# Patient Record
Sex: Female | Born: 1935 | Race: White | Hispanic: No | Smoking: Former smoker
Health system: Southern US, Community
[De-identification: ages and names within clinical notes are randomized; demographics above are authoritative.]

## PROBLEM LIST (undated history)

## (undated) DIAGNOSIS — R001 Bradycardia, unspecified: Secondary | ICD-10-CM

## (undated) DIAGNOSIS — J439 Emphysema, unspecified: Secondary | ICD-10-CM

## (undated) DIAGNOSIS — I252 Old myocardial infarction: Secondary | ICD-10-CM

## (undated) DIAGNOSIS — I509 Heart failure, unspecified: Secondary | ICD-10-CM

## (undated) DIAGNOSIS — I10 Essential (primary) hypertension: Secondary | ICD-10-CM

## (undated) HISTORY — DX: Essential (primary) hypertension: I10

## (undated) HISTORY — DX: Old myocardial infarction: I25.2

## (undated) HISTORY — PX: HIP ARTHROPLASTY: SHX981

## (undated) HISTORY — DX: Heart failure, unspecified: I50.9

## (undated) HISTORY — DX: Emphysema, unspecified: J43.9

## (undated) HISTORY — DX: Bradycardia, unspecified: R00.1

---

## 2020-07-30 ENCOUNTER — Emergency Department (HOSPITAL_COMMUNITY)
Admission: EM | Admit: 2020-07-30 | Discharge: 2020-07-30 | Disposition: A | Discharge status: Home / Self Care | Payer: Medicare Other | Attending: Emergency Medicine | Admitting: Emergency Medicine

## 2020-07-30 ENCOUNTER — Emergency Department (HOSPITAL_COMMUNITY): Payer: Medicare Other

## 2020-07-30 ENCOUNTER — Encounter (HOSPITAL_COMMUNITY): Payer: Self-pay | Admitting: Emergency Medicine

## 2020-07-30 ENCOUNTER — Other Ambulatory Visit: Payer: Self-pay

## 2020-07-30 DIAGNOSIS — N289 Disorder of kidney and ureter, unspecified: Secondary | ICD-10-CM | POA: Diagnosis not present

## 2020-07-30 DIAGNOSIS — W19XXXA Unspecified fall, initial encounter: Secondary | ICD-10-CM | POA: Diagnosis not present

## 2020-07-30 DIAGNOSIS — N184 Chronic kidney disease, stage 4 (severe): Secondary | ICD-10-CM | POA: Insufficient documentation

## 2020-07-30 DIAGNOSIS — R519 Headache, unspecified: Secondary | ICD-10-CM | POA: Insufficient documentation

## 2020-07-30 DIAGNOSIS — F0390 Unspecified dementia without behavioral disturbance: Secondary | ICD-10-CM | POA: Diagnosis not present

## 2020-07-30 LAB — CBC WITH DIFFERENTIAL/PLATELET
Abs Immature Granulocytes: 0.08 10*3/uL — ABNORMAL HIGH (ref 0.00–0.07)
Basophils Absolute: 0 10*3/uL (ref 0.0–0.1)
Basophils Relative: 0 %
Eosinophils Absolute: 0.2 10*3/uL (ref 0.0–0.5)
Eosinophils Relative: 1 %
HCT: 37.8 % (ref 36.0–46.0)
Hemoglobin: 12 g/dL (ref 12.0–15.0)
Immature Granulocytes: 1 %
Lymphocytes Relative: 8 %
Lymphs Abs: 1.2 10*3/uL (ref 0.7–4.0)
MCH: 30.8 pg (ref 26.0–34.0)
MCHC: 31.7 g/dL (ref 30.0–36.0)
MCV: 96.9 fL (ref 80.0–100.0)
Monocytes Absolute: 1.5 10*3/uL — ABNORMAL HIGH (ref 0.1–1.0)
Monocytes Relative: 10 %
Neutro Abs: 11.8 10*3/uL — ABNORMAL HIGH (ref 1.7–7.7)
Neutrophils Relative %: 80 %
Platelets: 249 10*3/uL (ref 150–400)
RBC: 3.9 MIL/uL (ref 3.87–5.11)
RDW: 15.5 % (ref 11.5–15.5)
WBC: 14.8 10*3/uL — ABNORMAL HIGH (ref 4.0–10.5)
nRBC: 0 % (ref 0.0–0.2)

## 2020-07-30 LAB — BASIC METABOLIC PANEL
Anion gap: 11 (ref 5–15)
BUN: 44 mg/dL — ABNORMAL HIGH (ref 8–23)
CO2: 26 mmol/L (ref 22–32)
Calcium: 9.4 mg/dL (ref 8.9–10.3)
Chloride: 103 mmol/L (ref 98–111)
Creatinine, Ser: 2.43 mg/dL — ABNORMAL HIGH (ref 0.44–1.00)
GFR, Estimated: 18 mL/min — ABNORMAL LOW (ref >60)
Glucose, Bld: 121 mg/dL — ABNORMAL HIGH (ref 70–99)
Potassium: 3.6 mmol/L (ref 3.5–5.1)
Sodium: 140 mmol/L (ref 135–145)

## 2020-07-30 MED ORDER — LORAZEPAM 1 MG PO TABS
1.0000 mg | ORAL_TABLET | Freq: Once | ORAL | Status: AC
  Administered 2020-07-30: 1 mg via ORAL
  Filled 2020-07-30: qty 1

## 2020-07-30 MED ORDER — SODIUM CHLORIDE 0.9 % IV BOLUS
1000.0000 mL | Freq: Once | INTRAVENOUS | Status: AC
  Administered 2020-07-30: 1000 mL via INTRAVENOUS

## 2020-07-30 NOTE — ED Notes (Signed)
This RN attempted x2 to call report to [facility], this RN kept being put on hold.

## 2020-07-30 NOTE — ED Provider Notes (Signed)
[place] DEPT Provider Note   CSN: [id number] Arrival date & time: 07/30/20  [id number]     History Chief Complaint  Patient presents with  . Fall    Tanya Koch is a 87 y.o. female.  Patient presents for concern for possible fall.  She has history of dementia was found on the ground by caregiver.  She was last seen the night prior to this morning.  Patient herself denies any pain.  She denies headache or chest pain abdominal pain or extremity pain.  There are no reports of fever or cough or vomiting or diarrhea.          History reviewed. No pertinent past medical history.  There are no problems to display for this patient.      OB History   No obstetric history on file.     History reviewed. No pertinent family history.     Home Medications Prior to Admission medications   Not on File    Allergies    Patient has no allergy information on record.  Review of Systems   Review of Systems  Unable to perform ROS: Dementia    Physical Exam Updated Vital Signs BP (!) 178/100 (BP Location: Left Arm)   Pulse 67   Temp 98.2 F (36.8 C) (Oral)   Resp 16   SpO2 96%   Physical Exam Constitutional:      General: She is not in acute distress.    Appearance: Normal appearance.  HENT:     Head: Normocephalic and atraumatic.     Comments: No scalp hematoma or tenderness or laceration noted.    Nose: Nose normal.  Eyes:     Extraocular Movements: Extraocular movements intact.  Cardiovascular:     Rate and Rhythm: Normal rate.  Pulmonary:     Effort: Pulmonary effort is normal.  Musculoskeletal:        General: Normal range of motion.     Cervical back: Normal range of motion.     Comments: No C or T or L-spine midline step-offs or tenderness noted.  Neurological:     General: No focal deficit present.     Mental Status: She is alert. Mental status is at baseline.     ED Results / Procedures / Treatments   Labs (all  labs ordered are listed, but only abnormal results are displayed) Labs Reviewed  CBC WITH DIFFERENTIAL/PLATELET - Abnormal; Notable for the following components:      Result Value   WBC 14.8 (*)    Neutro Abs 11.8 (*)    Monocytes Absolute 1.5 (*)    Abs Immature Granulocytes 0.08 (*)    All other components within normal limits  BASIC METABOLIC PANEL - Abnormal; Notable for the following components:   Glucose, Bld 121 (*)    BUN 44 (*)    Creatinine, Ser 2.43 (*)    GFR, Estimated 18 (*)    All other components within normal limits    EKG None  Radiology CT Head Wo Contrast  Result Date: 07/30/2020 CLINICAL DATA:  Head trauma, minor. Additional history provided: Unwitnessed fall. EXAM: CT HEAD WITHOUT CONTRAST TECHNIQUE: Contiguous axial images were obtained from the base of the skull through the vertex without intravenous contrast. COMPARISON:  No pertinent prior exams available for comparison. FINDINGS: Brain: Mild-to-moderate generalized cerebral atrophy. There is no acute intracranial hemorrhage. No demarcated cortical infarct. No extra-axial fluid collection. No evidence of intracranial mass. No midline shift. Vascular: No hyperdense  vessel.  Atherosclerotic calcifications Skull: Normal. Negative for fracture or focal lesion. Sinuses/Orbits: Visualized orbits show no acute finding. No significant paranasal sinus disease at the imaged levels. IMPRESSION: No evidence of acute intracranial abnormality. Mild-to-moderate generalized cerebral atrophy. Electronically Signed   By: Kellie Simmering DO   On: 07/30/2020 11:13    Procedures Procedures   Medications Ordered in ED Medications  sodium chloride 0.9 % bolus 1,000 mL (1,000 mLs Intravenous New Bag/Given 07/30/20 1201)    ED Course  I have reviewed the triage vital signs and the nursing notes.  Pertinent labs & imaging results that were available during my care of the patient were reviewed by me and considered in my medical decision  making (see chart for details).    MDM Rules/Calculators/A&P                          Patient appears comfortable with no complaints of pain.  She has no clinical evidence of head injury on exam and unable to give a complete history of what exactly happened.  Caregiver suspect that she may have some hip pain but patient herself denies at this time.  Labs are sent white count of 14 chemistry shows renal insufficiency creatinine 2.4 BUN of 44 potassium 3.6.  Unable to drop any prior labs unclear if this is acute or chronic.  Patient given a liter bolus of fluids, recommending outpatient follow-up with her doctor for kidney disease.  Advised immediate return for new numbness weakness fevers or any additional concerns.  Final Clinical Impression(s) / ED Diagnoses Final diagnoses:  Fall, initial encounter  Renal insufficiency    Rx / DC Orders ED Discharge Orders    None       Luna Fuse, MD 07/30/20 1250

## 2020-07-30 NOTE — Discharge Instructions (Addendum)
Blood work shows patient is having decreased renal function.  She will need to follow-up with her primary care doctor regarding this finding within the week.  Recommending immediate return for worsening symptoms fevers pain or any additional concerns.

## 2020-07-30 NOTE — ED Notes (Signed)
[facility] bedside 

## 2020-07-30 NOTE — ED Triage Notes (Signed)
BIBA Per EMS: Pt coming from [facility] with an unwitnessed fall. Pt was found on ground by caregivers. Pt has no obvious bruising or deformities. Pt denies pain or any other complaints. All vitals WDL.

## 2020-07-30 NOTE — ED Notes (Signed)
[facility] called  

## 2020-12-04 ENCOUNTER — Other Ambulatory Visit: Payer: Self-pay

## 2020-12-04 ENCOUNTER — Emergency Department (HOSPITAL_BASED_OUTPATIENT_CLINIC_OR_DEPARTMENT_OTHER)
Admission: EM | Admit: 2020-12-04 | Discharge: 2020-12-04 | Disposition: A | Discharge status: Home / Self Care | Payer: Medicare Other | Source: Home / Self Care | Attending: Emergency Medicine | Admitting: Emergency Medicine

## 2020-12-04 ENCOUNTER — Encounter (HOSPITAL_BASED_OUTPATIENT_CLINIC_OR_DEPARTMENT_OTHER): Payer: Self-pay | Admitting: Emergency Medicine

## 2020-12-04 DIAGNOSIS — R1111 Vomiting without nausea: Secondary | ICD-10-CM | POA: Insufficient documentation

## 2020-12-04 DIAGNOSIS — K920 Hematemesis: Secondary | ICD-10-CM | POA: Diagnosis not present

## 2020-12-04 DIAGNOSIS — K226 Gastro-esophageal laceration-hemorrhage syndrome: Secondary | ICD-10-CM | POA: Diagnosis not present

## 2020-12-04 LAB — CBC WITH DIFFERENTIAL/PLATELET
Abs Immature Granulocytes: 0.04 10*3/uL (ref 0.00–0.07)
Basophils Absolute: 0 10*3/uL (ref 0.0–0.1)
Basophils Relative: 0 %
Eosinophils Absolute: 0.4 10*3/uL (ref 0.0–0.5)
Eosinophils Relative: 3 %
HCT: 34.3 % — ABNORMAL LOW (ref 36.0–46.0)
Hemoglobin: 11.2 g/dL — ABNORMAL LOW (ref 12.0–15.0)
Immature Granulocytes: 0 %
Lymphocytes Relative: 12 %
Lymphs Abs: 1.4 10*3/uL (ref 0.7–4.0)
MCH: 30.8 pg (ref 26.0–34.0)
MCHC: 32.7 g/dL (ref 30.0–36.0)
MCV: 94.2 fL (ref 80.0–100.0)
Monocytes Absolute: 1.5 10*3/uL — ABNORMAL HIGH (ref 0.1–1.0)
Monocytes Relative: 12 %
Neutro Abs: 8.9 10*3/uL — ABNORMAL HIGH (ref 1.7–7.7)
Neutrophils Relative %: 73 %
Platelets: 242 10*3/uL (ref 150–400)
RBC: 3.64 MIL/uL — ABNORMAL LOW (ref 3.87–5.11)
RDW: 15.9 % — ABNORMAL HIGH (ref 11.5–15.5)
WBC: 12.3 10*3/uL — ABNORMAL HIGH (ref 4.0–10.5)
nRBC: 0 % (ref 0.0–0.2)

## 2020-12-04 LAB — COMPREHENSIVE METABOLIC PANEL
ALT: 6 U/L (ref 0–44)
AST: 14 U/L — ABNORMAL LOW (ref 15–41)
Albumin: 3.4 g/dL — ABNORMAL LOW (ref 3.5–5.0)
Alkaline Phosphatase: 45 U/L (ref 38–126)
Anion gap: 11 (ref 5–15)
BUN: 24 mg/dL — ABNORMAL HIGH (ref 8–23)
CO2: 30 mmol/L (ref 22–32)
Calcium: 9.1 mg/dL (ref 8.9–10.3)
Chloride: 98 mmol/L (ref 98–111)
Creatinine, Ser: 2.55 mg/dL — ABNORMAL HIGH (ref 0.44–1.00)
GFR, Estimated: 17 mL/min — ABNORMAL LOW (ref >60)
Glucose, Bld: 114 mg/dL — ABNORMAL HIGH (ref 70–99)
Potassium: 3.4 mmol/L — ABNORMAL LOW (ref 3.5–5.1)
Sodium: 139 mmol/L (ref 135–145)
Total Bilirubin: 0.4 mg/dL (ref 0.3–1.2)
Total Protein: 6.9 g/dL (ref 6.5–8.1)

## 2020-12-04 LAB — LIPASE, BLOOD: Lipase: 36 U/L (ref 11–51)

## 2020-12-04 MED ORDER — ONDANSETRON HCL 4 MG PO TABS: 4.0000 mg | ORAL_TABLET | Freq: Three times a day (TID) | ORAL | 0 refills | Status: DC | PRN

## 2020-12-04 MED ORDER — SODIUM CHLORIDE 0.9 % IV BOLUS
1000.0000 mL | Freq: Once | INTRAVENOUS | Status: AC
  Administered 2020-12-04: 1000 mL via INTRAVENOUS

## 2020-12-04 NOTE — ED Triage Notes (Signed)
Per ems , pt from  facility [facility] , emesis x 3 today , Hs dementia . No obvious distress.

## 2020-12-04 NOTE — ED Provider Notes (Signed)
[place] EMERGENCY DEPT Provider Note   CSN: [id number] Arrival date & time: 12/04/20  1839     History Chief Complaint  Patient presents with   Emesis    Tanya Koch is a 87 y.o. female.  Patient here from nursing home after she had 3 emesis episodes earlier today.  Currently she has no symptoms.  No abdominal pain, no chest pain, shortness of breath.  On antibiotics currently for dental infection.  She denies any diarrhea.  The history is provided by the patient.  Emesis Severity:  Mild Relieved by:  Nothing Worsened by:  Nothing Associated symptoms: no abdominal pain, no arthralgias, no chills, no cough, no diarrhea, no fever, no headaches, no myalgias, no sore throat and no URI       No past medical history on file.  There are no problems to display for this patient.   History reviewed. No pertinent surgical history.   OB History   No obstetric history on file.     No family history on file.     Home Medications Prior to Admission medications   Medication Sig Start Date End Date Taking? Authorizing Provider  ondansetron (ZOFRAN) 4 MG tablet Take 1 tablet (4 mg total) by mouth every 8 (eight) hours as needed for nausea or vomiting. 12/04/20  Yes Daylene Vandenbosch, DO    Allergies    Patient has no known allergies.  Review of Systems   Review of Systems  Constitutional:  Negative for chills and fever.  HENT:  Negative for ear pain and sore throat.   Eyes:  Negative for pain and visual disturbance.  Respiratory:  Negative for cough and shortness of breath.   Cardiovascular:  Negative for chest pain and palpitations.  Gastrointestinal:  Positive for vomiting. Negative for abdominal pain, diarrhea and nausea.  Genitourinary:  Negative for dysuria and hematuria.  Musculoskeletal:  Negative for arthralgias, back pain and myalgias.  Skin:  Negative for color change and rash.  Neurological:  Negative for seizures, syncope and headaches.  All  other systems reviewed and are negative.  Physical Exam Updated Vital Signs BP (!) 155/68 (BP Location: Right Arm)   Pulse (!) 52   Temp 98.4 F (36.9 C)   Resp 20   SpO2 100%   Physical Exam Vitals and nursing note reviewed.  Constitutional:      General: She is not in acute distress.    Appearance: She is well-developed. She is not ill-appearing.  HENT:     Head: Normocephalic and atraumatic.     Nose: Nose normal.     Mouth/Throat:     Mouth: Mucous membranes are moist.  Eyes:     Extraocular Movements: Extraocular movements intact.     Conjunctiva/sclera: Conjunctivae normal.     Pupils: Pupils are equal, round, and reactive to light.  Cardiovascular:     Rate and Rhythm: Normal rate and regular rhythm.     Pulses: Normal pulses.     Heart sounds: Normal heart sounds. No murmur heard. Pulmonary:     Effort: Pulmonary effort is normal. No respiratory distress.     Breath sounds: Normal breath sounds.  Abdominal:     General: Abdomen is flat.     Palpations: Abdomen is soft.     Tenderness: There is no abdominal tenderness.  Musculoskeletal:     Cervical back: Normal range of motion and neck supple.  Skin:    General: Skin is warm and dry.     Capillary  Refill: Capillary refill takes less than 2 seconds.  Neurological:     General: No focal deficit present.     Mental Status: She is alert.  Psychiatric:        Mood and Affect: Mood normal.    ED Results / Procedures / Treatments   Labs (all labs ordered are listed, but only abnormal results are displayed) Labs Reviewed  CBC WITH DIFFERENTIAL/PLATELET - Abnormal; Notable for the following components:      Result Value   WBC 12.3 (*)    RBC 3.64 (*)    Hemoglobin 11.2 (*)    HCT 34.3 (*)    RDW 15.9 (*)    Neutro Abs 8.9 (*)    Monocytes Absolute 1.5 (*)    All other components within normal limits  COMPREHENSIVE METABOLIC PANEL - Abnormal; Notable for the following components:   Potassium 3.4 (*)     Glucose, Bld 114 (*)    BUN 24 (*)    Creatinine, Ser 2.55 (*)    Albumin 3.4 (*)    AST 14 (*)    GFR, Estimated 17 (*)    All other components within normal limits  LIPASE, BLOOD    EKG None  Radiology No results found.  Procedures Procedures   Medications Ordered in ED Medications  sodium chloride 0.9 % bolus 1,000 mL (0 mLs Intravenous Stopped 12/04/20 2201)    ED Course  I have reviewed the triage vital signs and the nursing notes.  Pertinent labs & imaging results that were available during my care of the patient were reviewed by me and considered in my medical decision making (see chart for details).    MDM Rules/Calculators/A&P                           Tanya Koch is here from nursing home after she had some episodes of emesis today.  She is asymptomatic now.  Normal vitals.  No fever.  History of reflux.  Patient overall very well-appearing.  No abdominal tenderness.  Having normal bowel movements.  Currently on antibiotics for dental infection.  Teeth look well.  No signs of bad infection there.  May be some mild side effects from her antibiotics but lab work today was unremarkable.  No significant anemia, electrolyte abnormality, kidney injury.  Gallbladder liver enzymes within normal limits.  Lipase normal.  Overall reassurance is given.  She is not having any chest pain or shortness of breath.  Will prescribe Zofran to use as needed.  She is not currently nauseous.  Discharged in good condition.  This chart was dictated using voice recognition software.  Despite best efforts to proofread,  errors can occur which can change the documentation meaning.   Final Clinical Impression(s) / ED Diagnoses Final diagnoses:  Vomiting without nausea, unspecified vomiting type    Rx / DC Orders ED Discharge Orders          Ordered    ondansetron (ZOFRAN) 4 MG tablet  Every 8 hours PRN        12/04/20 2210             Lennice Sites, DO 12/04/20 2217

## 2020-12-05 ENCOUNTER — Emergency Department (HOSPITAL_COMMUNITY): Payer: Medicare Other

## 2020-12-05 ENCOUNTER — Encounter (HOSPITAL_COMMUNITY): Payer: Self-pay

## 2020-12-05 ENCOUNTER — Other Ambulatory Visit: Payer: Self-pay

## 2020-12-05 ENCOUNTER — Inpatient Hospital Stay (HOSPITAL_COMMUNITY)
Admission: EM | Admit: 2020-12-05 | Discharge: 2020-12-08 | DRG: 368 | Disposition: A | Discharge status: Nursing Home (Includes ICF) | Payer: Medicare Other | Source: Skilled Nursing Facility | Attending: Internal Medicine | Admitting: Internal Medicine

## 2020-12-05 DIAGNOSIS — U071 COVID-19: Secondary | ICD-10-CM | POA: Diagnosis present

## 2020-12-05 DIAGNOSIS — K2101 Gastro-esophageal reflux disease with esophagitis, with bleeding: Secondary | ICD-10-CM | POA: Diagnosis not present

## 2020-12-05 DIAGNOSIS — K920 Hematemesis: Secondary | ICD-10-CM

## 2020-12-05 DIAGNOSIS — F0390 Unspecified dementia without behavioral disturbance: Secondary | ICD-10-CM | POA: Diagnosis not present

## 2020-12-05 DIAGNOSIS — I130 Hypertensive heart and chronic kidney disease with heart failure and stage 1 through stage 4 chronic kidney disease, or unspecified chronic kidney disease: Secondary | ICD-10-CM | POA: Diagnosis present

## 2020-12-05 DIAGNOSIS — N184 Chronic kidney disease, stage 4 (severe): Secondary | ICD-10-CM | POA: Diagnosis present

## 2020-12-05 DIAGNOSIS — K226 Gastro-esophageal laceration-hemorrhage syndrome: Principal | ICD-10-CM | POA: Diagnosis present

## 2020-12-05 DIAGNOSIS — E876 Hypokalemia: Secondary | ICD-10-CM | POA: Diagnosis present

## 2020-12-05 DIAGNOSIS — K047 Periapical abscess without sinus: Secondary | ICD-10-CM | POA: Diagnosis present

## 2020-12-05 DIAGNOSIS — K219 Gastro-esophageal reflux disease without esophagitis: Secondary | ICD-10-CM | POA: Diagnosis present

## 2020-12-05 DIAGNOSIS — H9190 Unspecified hearing loss, unspecified ear: Secondary | ICD-10-CM | POA: Diagnosis present

## 2020-12-05 DIAGNOSIS — Z66 Do not resuscitate: Secondary | ICD-10-CM | POA: Diagnosis present

## 2020-12-05 DIAGNOSIS — I5022 Chronic systolic (congestive) heart failure: Secondary | ICD-10-CM | POA: Diagnosis present

## 2020-12-05 DIAGNOSIS — D631 Anemia in chronic kidney disease: Secondary | ICD-10-CM | POA: Diagnosis present

## 2020-12-05 DIAGNOSIS — K029 Dental caries, unspecified: Secondary | ICD-10-CM | POA: Diagnosis present

## 2020-12-05 HISTORY — DX: Hematemesis: K92.0

## 2020-12-05 LAB — CBC WITH DIFFERENTIAL/PLATELET
Abs Immature Granulocytes: 0.04 10*3/uL (ref 0.00–0.07)
Basophils Absolute: 0.1 10*3/uL (ref 0.0–0.1)
Basophils Relative: 1 %
Eosinophils Absolute: 0.3 10*3/uL (ref 0.0–0.5)
Eosinophils Relative: 3 %
HCT: 35.1 % — ABNORMAL LOW (ref 36.0–46.0)
Hemoglobin: 11 g/dL — ABNORMAL LOW (ref 12.0–15.0)
Immature Granulocytes: 0 %
Lymphocytes Relative: 11 %
Lymphs Abs: 1.2 10*3/uL (ref 0.7–4.0)
MCH: 30.6 pg (ref 26.0–34.0)
MCHC: 31.3 g/dL (ref 30.0–36.0)
MCV: 97.5 fL (ref 80.0–100.0)
Monocytes Absolute: 1.2 10*3/uL — ABNORMAL HIGH (ref 0.1–1.0)
Monocytes Relative: 11 %
Neutro Abs: 7.9 10*3/uL — ABNORMAL HIGH (ref 1.7–7.7)
Neutrophils Relative %: 74 %
Platelets: 234 10*3/uL (ref 150–400)
RBC: 3.6 MIL/uL — ABNORMAL LOW (ref 3.87–5.11)
RDW: 16.2 % — ABNORMAL HIGH (ref 11.5–15.5)
WBC: 10.6 10*3/uL — ABNORMAL HIGH (ref 4.0–10.5)
nRBC: 0 % (ref 0.0–0.2)

## 2020-12-05 LAB — RESP PANEL BY RT-PCR (FLU A&B, COVID) ARPGX2
Influenza A by PCR: NEGATIVE
Influenza B by PCR: NEGATIVE
SARS Coronavirus 2 by RT PCR: POSITIVE — AB

## 2020-12-05 LAB — COMPREHENSIVE METABOLIC PANEL
ALT: 9 U/L (ref 0–44)
AST: 17 U/L (ref 15–41)
Albumin: 3.2 g/dL — ABNORMAL LOW (ref 3.5–5.0)
Alkaline Phosphatase: 44 U/L (ref 38–126)
Anion gap: 9 (ref 5–15)
BUN: 22 mg/dL (ref 8–23)
CO2: 28 mmol/L (ref 22–32)
Calcium: 8.6 mg/dL — ABNORMAL LOW (ref 8.9–10.3)
Chloride: 102 mmol/L (ref 98–111)
Creatinine, Ser: 2.42 mg/dL — ABNORMAL HIGH (ref 0.44–1.00)
GFR, Estimated: 19 mL/min — ABNORMAL LOW (ref >60)
Glucose, Bld: 130 mg/dL — ABNORMAL HIGH (ref 70–99)
Potassium: 3.5 mmol/L (ref 3.5–5.1)
Sodium: 139 mmol/L (ref 135–145)
Total Bilirubin: 0.7 mg/dL (ref 0.3–1.2)
Total Protein: 6.8 g/dL (ref 6.5–8.1)

## 2020-12-05 LAB — PROTIME-INR
INR: 1 (ref 0.8–1.2)
Prothrombin Time: 13.3 seconds (ref 11.4–15.2)

## 2020-12-05 LAB — TYPE AND SCREEN
ABO/RH(D): O POS
Antibody Screen: NEGATIVE

## 2020-12-05 LAB — LIPASE, BLOOD: Lipase: 40 U/L (ref 11–51)

## 2020-12-05 LAB — MAGNESIUM: Magnesium: 2 mg/dL (ref 1.7–2.4)

## 2020-12-05 LAB — ABO/RH: ABO/RH(D): O POS

## 2020-12-05 MED ORDER — AMOXICILLIN 500 MG PO CAPS: 500.0000 mg | ORAL_CAPSULE | Freq: Three times a day (TID) | ORAL | Status: DC

## 2020-12-05 MED ORDER — AMOXICILLIN 500 MG PO CAPS
500.0000 mg | ORAL_CAPSULE | Freq: Two times a day (BID) | ORAL | Status: AC
  Administered 2020-12-06 – 2020-12-07 (×4): 500 mg via ORAL
  Filled 2020-12-05 (×5): qty 1

## 2020-12-05 MED ORDER — ONDANSETRON HCL 4 MG PO TABS: 4.0000 mg | ORAL_TABLET | Freq: Three times a day (TID) | ORAL | Status: DC | PRN

## 2020-12-05 MED ORDER — SODIUM CHLORIDE 0.9 % IV SOLN: INTRAVENOUS | Status: DC

## 2020-12-05 MED ORDER — PANTOPRAZOLE SODIUM 40 MG IV SOLR
40.0000 mg | Freq: Two times a day (BID) | INTRAVENOUS | Status: DC
  Administered 2020-12-06 – 2020-12-08 (×6): 40 mg via INTRAVENOUS
  Filled 2020-12-05 (×6): qty 40

## 2020-12-05 NOTE — ED Notes (Signed)
Walked pt to restroom with assistance of two other personnel.  Closed door behind pt and waited outside restroom.  Pt yelled out immediately, had fallen while trying to sit down on toilet.  Small hematoma to left eyebrow, skin intact, denies pain, no other injuries noted, no LOC.  All three personnel assisted pt up off of floor, onto toilet, up off toilet, and back to room.  Posey belt in place for safety.  Provider at bedside, stated unremarkable.

## 2020-12-05 NOTE — ED Triage Notes (Signed)
Pt. BIB [facility] from [facility] c/o vomiting. Vomit contains bright red blood. Has had decreased appetite over past week.Hx of Dementia.  EMS VS BP: 156/58 Pulse:86 CBG:158 SpO2: 98% RA RR: 16

## 2020-12-05 NOTE — ED Provider Notes (Signed)
[facility] DEPT Provider Note   CSN: [id number] Arrival date & time: 12/05/20  1317     History No chief complaint on file.   Tanya Koch is a 87 y.o. female.  Patient with history of MI and dementia presents today from [facility] facility with hematemesis.  Given dementia, most history is provided by the son who was present and from CNA that takes care of her at her facility. CNA states that the patient had 5 episodes of nonbloody emesis yesterday within 30 minutes following eating dinner last night. Was seen at [facility] yesterday for same with unremarkable workup, discharge with Zofran. Today during breakfast, patient had 1 more episode of vomiting this time with bright red blood and clots visualized, sent for reevaluation. Patient is alert and oriented x2 per baseline and has no complaints. Patient is not anticoagulated and has no history of liver disease. Of note, according to the patient's son, the patient has not been eating well for the past few weeks. She was evaluated for same and found to have several dental abscesses for which she was prescribed amoxicillin. Son states that they are working on plan for dental follow-up and tooth extraction.   Level 5 caveat -- dementia  The history is provided by a relative, the patient and the nursing home. No language interpreter was used.      History reviewed. No pertinent past medical history.  There are no problems to display for this patient.   History reviewed. No pertinent surgical history.   OB History   No obstetric history on file.     History reviewed. No pertinent family history.     Home Medications Prior to Admission medications   Medication Sig Start Date End Date Taking? Authorizing Provider  ondansetron (ZOFRAN) 4 MG tablet Take 1 tablet (4 mg total) by mouth every 8 (eight) hours as needed for nausea or vomiting. 12/04/20   Lennice Sites, DO    Allergies     Patient has no known allergies.  Review of Systems   Review of Systems  Unable to perform ROS: Dementia   Physical Exam Updated Vital Signs BP (!) 147/91 (BP Location: Right Arm)   Pulse (!) 58   Temp 98.5 F (36.9 C)   Resp 18   SpO2 99%   Physical Exam Vitals and nursing note reviewed.  Constitutional:      General: She is not in acute distress.    Appearance: Normal appearance. She is normal weight. She is not ill-appearing, toxic-appearing or diaphoretic.  HENT:     Head: Normocephalic and atraumatic.     Mouth/Throat:     Mouth: Mucous membranes are moist.     Comments: Dentition poor, no visible abscess Eyes:     Extraocular Movements: Extraocular movements intact.     Pupils: Pupils are equal, round, and reactive to light.  Cardiovascular:     Rate and Rhythm: Normal rate and regular rhythm.     Pulses: Normal pulses.     Heart sounds: Normal heart sounds.  Pulmonary:     Effort: Pulmonary effort is normal. No respiratory distress.     Breath sounds: Normal breath sounds.  Abdominal:     General: Abdomen is flat. Bowel sounds are normal. There is no distension.     Palpations: Abdomen is soft. There is no mass.     Tenderness: There is no abdominal tenderness. There is no right CVA tenderness, left CVA tenderness, guarding or rebound.  Hernia: No hernia is present.  Musculoskeletal:        General: Normal range of motion.     Cervical back: Normal range of motion.  Skin:    General: Skin is warm and dry.  Neurological:     Mental Status: She is alert. Mental status is at baseline.  Psychiatric:        Mood and Affect: Mood normal.        Behavior: Behavior normal.    ED Results / Procedures / Treatments   Labs (all labs ordered are listed, but only abnormal results are displayed) Labs Reviewed  CBC WITH DIFFERENTIAL/PLATELET - Abnormal; Notable for the following components:      Result Value   WBC 10.6 (*)    RBC 3.60 (*)    Hemoglobin 11.0 (*)     HCT 35.1 (*)    RDW 16.2 (*)    Neutro Abs 7.9 (*)    Monocytes Absolute 1.2 (*)    All other components within normal limits  COMPREHENSIVE METABOLIC PANEL - Abnormal; Notable for the following components:   Glucose, Bld 130 (*)    Creatinine, Ser 2.42 (*)    Calcium 8.6 (*)    Albumin 3.2 (*)    GFR, Estimated 19 (*)    All other components within normal limits  RESP PANEL BY RT-PCR (FLU A&B, COVID) ARPGX2  LIPASE, BLOOD  PROTIME-INR  TYPE AND SCREEN  ABO/RH    EKG None  Radiology DG Abdomen Acute W/Chest  Result Date: 12/05/2020 CLINICAL DATA:  Hematemesis, decreased appetite EXAM: DG ABDOMEN ACUTE WITH 1 VIEW CHEST COMPARISON:  None. FINDINGS: Top-normal heart size. Mildly tortuous atherosclerotic thoracic aorta. Otherwise normal mediastinal contour. No pneumothorax. No pleural effusion. Mild linear left costophrenic angle scarring versus atelectasis. No pulmonary edema. No consolidative airspace disease. No dilated small bowel loops or air-fluid levels. Mild colonic stool. No evidence of pneumatosis or pneumoperitoneum. Mild levocurvature of the lumbar spine with associated degenerative changes. No radiopaque nephrolithiasis. IMPRESSION: 1. Mild left costophrenic angle scarring versus atelectasis. Otherwise no active cardiopulmonary disease. 2. Nonobstructive bowel gas pattern. Electronically Signed   By: Ilona Sorrel M.D.   On: 12/05/2020 15:06    Procedures Procedures   Medications Ordered in ED Medications - No data to display  ED Course  I have reviewed the triage vital signs and the nursing notes.  Pertinent labs & imaging results that were available during my care of the patient were reviewed by me and considered in my medical decision making (see chart for details).    MDM Rules/Calculators/A&P                         Patient presents today with 1 episode of hemetemesis this morning following several episodes of nonbloody emesis yesterday. Patient is not  anticoagulated, no history of liver disease. Low suspicion for esophageal varices rupture at this time, octreotide not indicated.  Patient with dementia who has no complaints at this time and no memory of event. Nontoxic, vitals unremarkable.   Additional history obtained:  Additional history obtained from chart review & nursing note review.   Lab Tests:  I Ordered, reviewed, and interpreted labs, which included:  CBC, CMP, Lipase, INR Leukocytosis decreased from yesterday 12.3 -->11.6 Hemoglobin 11.2-->11.0 No thrombocytopenia Lipase and INR unremarkable   Imaging Studies ordered:  I ordered imaging studies which included DG acute abdomen with 1 view chest, I independently reviewed, formal radiology impression shows:  1. Mild left costophrenic angle scarring versus atelectasis. Otherwise no active cardiopulmonary disease. 2. Nonobstructive bowel gas pattern.  ED Course:  Patient with 1 episode of vomiting with visible clots following several episodes of nonbloody emesis yesterday. Patient has no complaints with unremarkable physical exam. No abdominal tenderness. No episodes of vomiting in the ER today. Labs unremarkable for acute changes, no concern for acute abdomen at this time. Patient is afebrile, non-toxic appearing, and in no acute distress. Hemodynamically stable at this time with negative imaging, low suspicion for Boerhaave's at this time.  Some concern for Mallory Weiss tear at this time given recent multiple episodes of vomiting yesterday. Given patients age and comorbidities combined with second visit in 24 hours, feel patient should be monitored overnight for observation and seen by GI tomorrow to evaluate for potential endoscopy. Patient and family are amenable with plan.  Patient discussed with GI Dr. Therisa Doyne who agrees to see patient tomorrow for evaluation.  Patient discussed with hospitalist Dr. Kyung Bacca who has agreed to admit patient.  Portions of this note were  generated with [employer]. Dictation errors may occur despite best attempts at proofreading.   This is a shared visit with supervising physician Dr. Laverta Baltimore who has independently evaluated patient & provided guidance in evaluation/management/disposition, in agreement with care     Final Clinical Impression(s) / ED Diagnoses Final diagnoses:  Hematemesis, unspecified whether nausea present    Rx / DC Orders ED Discharge Orders     None        Nestor Lewandowsky 12/05/20 1850    Long, Wonda Old[facility], MD 12/06/20 [phone]

## 2020-12-05 NOTE — H&P (Addendum)
History and Physical  Ritamae Hyder [id number] DOB: February 23, 1929 DOA: 12/05/2020  Referring physician: Lavonna Rua PCP: Earmon Phoenix, NP  Outpatient Specialists: None Patient coming from: Assisted living facility [facility] memory care & is able to ambulate   Chief Complaint: Hematemesis x1 today  HPI: Tanya Koch is a 87 y.o. female with medical history significant for dementia and GERD who lives in an assisted living facility brought in for evaluation of 1 bloody emesis.  Patient had had several days of vomiting was seen in this ER yesterday December 04 2020 for several episodes of emesis (3) which was thought to be due to antibiotics which she is taking for dental abscess amoxicillin to be exact she was given IV hydration and released back to the nursing facility but today they returned because she had a bloody emesis during breakfast it was noted to be bright red blood and bit of clot.  Patient is not on any anticoagulation and has no history of liver disease.  Patient had not been eating well for the past several days probably from the tooth problem. History is provided by her son Tanya Koch who is at bedside   ED Course: Patient was evaluated in the ER her vital signs remained stable.  WBC is 10.6 hemoglobin 11.0, glucose is 130 creatinine however is elevated is 2.42.  This seems to be at baseline abdominal x-ray was done shows mild costophrenic angle scaring versus atelectasis otherwise no active cardiopulmonary disease nonobstructive bowel gas pattern ED requested admission for observation due to the history of episodes of vomiting 2 and then and hematemesis to rule out any repeat episode that might be have occurred due to Mallory-Weiss syndrome or tear Patient will be admitted for 24-hour observation Review of Systems:  Pt denies any abdominal pain or diarrhea no fever no myalgia no chills no cough no upper respiratory infection no fever no headaches.  Review of systems are otherwise  negative Positive for vomiting   History reviewed. No pertinent past medical history. History reviewed. No pertinent surgical history.  Social History:  has no history on file for tobacco use, alcohol use, and drug use.   No Known Allergies  History reviewed. No pertinent family history.    Prior to Admission medications   Medication Sig Start Date End Date Taking? Authorizing Provider  ondansetron (ZOFRAN) 4 MG tablet Take 1 tablet (4 mg total) by mouth every 8 (eight) hours as needed for nausea or vomiting. 12/04/20   Lennice Sites, DO    Physical Exam: BP (!) 153/57   Pulse (!) 54   Temp 98.5 F (36.9 C)   Resp 19   SpO2 96%   Exam:  General: 87 y.o. year-old female well developed well nourished in no acute distress.  Alert and oriented x3.  Hard of hearing well-appearing Cardiovascular: Regular rate and rhythm with no rubs or gallops.  No thyromegaly or JVD noted.   Respiratory: Clear to auscultation with no wheezes or rales. Good inspiratory effort. Abdomen: Soft nontender nondistended with normal bowel sounds x4 quadrants. Musculoskeletal: No lower extremity edema. 2/4 pulses in all 4 extremities. Skin: No ulcerative lesions noted or rashes, Psychiatry: Mood is appropriate for condition and setting           Labs on Admission:  Basic Metabolic Panel: Recent Labs  Lab 12/04/20 2108 12/05/20 1408  NA 139 139  K 3.4* 3.5  CL 98 102  CO2 30 28  GLUCOSE 114* 130*  BUN 24* 22  CREATININE 2.55* 2.42*  CALCIUM 9.1 8.6*   Liver Function Tests: Recent Labs  Lab 12/04/20 2108 12/05/20 1408  AST 14* 17  ALT 6 9  ALKPHOS 45 44  BILITOT 0.4 0.7  PROT 6.9 6.8  ALBUMIN 3.4* 3.2*   Recent Labs  Lab 12/04/20 2108 12/05/20 1408  LIPASE 36 40   No results for input(s): AMMONIA in the last 168 hours. CBC: Recent Labs  Lab 12/04/20 2108 12/05/20 1408  WBC 12.3* 10.6*  NEUTROABS 8.9* 7.9*  HGB 11.2* 11.0*  HCT 34.3* 35.1*  MCV 94.2 97.5  PLT 242 234    Cardiac Enzymes: No results for input(s): CKTOTAL, CKMB, CKMBINDEX, TROPONINI in the last 168 hours.  BNP (last 3 results) No results for input(s): BNP in the last 8760 hours.  ProBNP (last 3 results) No results for input(s): PROBNP in the last 8760 hours.  CBG: No results for input(s): GLUCAP in the last 168 hours.  Radiological Exams on Admission: DG Abdomen Acute W/Chest  Result Date: 12/05/2020 CLINICAL DATA:  Hematemesis, decreased appetite EXAM: DG ABDOMEN ACUTE WITH 1 VIEW CHEST COMPARISON:  None. FINDINGS: Top-normal heart size. Mildly tortuous atherosclerotic thoracic aorta. Otherwise normal mediastinal contour. No pneumothorax. No pleural effusion. Mild linear left costophrenic angle scarring versus atelectasis. No pulmonary edema. No consolidative airspace disease. No dilated small bowel loops or air-fluid levels. Mild colonic stool. No evidence of pneumatosis or pneumoperitoneum. Mild levocurvature of the lumbar spine with associated degenerative changes. No radiopaque nephrolithiasis. IMPRESSION: 1. Mild left costophrenic angle scarring versus atelectasis. Otherwise no active cardiopulmonary disease. 2. Nonobstructive bowel gas pattern. Electronically Signed   By: Ilona Sorrel M.D.   On: 12/05/2020 15:06    EKG: Independently reviewed.  None  Assessment/Plan Present on Admission:  Hematemesis  Dementia without behavioral disturbance (HCC)  GERD (gastroesophageal reflux disease)  Active Problems:   Hematemesis   Dementia without behavioral disturbance (HCC)   GERD (gastroesophageal reflux disease)  Hematemesis: Patient had 1 episode of hematemesis has not recurred we will admit overnight to monitor for any more bleeding  Mild anemia chronic  Acute/possibly chronic kidney disease Possible dehydration Creatinine is 2.45 this seems to be baseline I will give her some hydration given the fact that she had some episode of vomiting this might be an acute AKI and we will  recheck her BUN and creatinine in the morning  GERD: Continue Protonix  Dementia Stable with no behavior problem  Dental caries:/Abscess: Patient will complete her p.o. antibiotics  Elevated blood pressure none known history of hypertension Will monitor  I will also rehydrate  Severity of Illness: The appropriate patient status for this patient is OBSERVATION. Observation status is judged to be reasonable and necessary in order to provide the required intensity of service to ensure the patient's safety. The patient's presenting symptoms, physical exam findings, and initial radiographic and laboratory data in the context of their medical condition is felt to place them at decreased risk for further clinical deterioration. Furthermore, it is anticipated that the patient will be medically stable for discharge from the hospital within 2 midnights of admission. The following factors support the patient status of observation. due to the history of episodes of vomiting 2 and then and hematemesis to rule out any repeat episode that might be have occurred due to Mallory-Weiss syndrome or tear   DVT prophylaxis: SCD  Code Status: DNR discussed with her son Tanya Koch who is power of attorney and I personally reviewed her DNR papers for requesting natural death  Family  Communication: Son Tanya Koch at bedside  Disposition Plan: Likely DC back to assisted living facility after 24 hours  Consults called: None  Admission status: Observation    Cristal Deer MD [facility] Pager [phone]  If 7PM-7AM, please contact night-coverage [email or web address] Password [email or web address]  12/05/2020, 6:59 PM

## 2020-12-06 DIAGNOSIS — U071 COVID-19: Secondary | ICD-10-CM

## 2020-12-06 DIAGNOSIS — N184 Chronic kidney disease, stage 4 (severe): Secondary | ICD-10-CM | POA: Diagnosis present

## 2020-12-06 DIAGNOSIS — K219 Gastro-esophageal reflux disease without esophagitis: Secondary | ICD-10-CM | POA: Diagnosis present

## 2020-12-06 DIAGNOSIS — I130 Hypertensive heart and chronic kidney disease with heart failure and stage 1 through stage 4 chronic kidney disease, or unspecified chronic kidney disease: Secondary | ICD-10-CM | POA: Diagnosis present

## 2020-12-06 DIAGNOSIS — K226 Gastro-esophageal laceration-hemorrhage syndrome: Secondary | ICD-10-CM | POA: Diagnosis present

## 2020-12-06 DIAGNOSIS — K047 Periapical abscess without sinus: Secondary | ICD-10-CM | POA: Diagnosis present

## 2020-12-06 DIAGNOSIS — Z66 Do not resuscitate: Secondary | ICD-10-CM | POA: Diagnosis present

## 2020-12-06 DIAGNOSIS — K920 Hematemesis: Secondary | ICD-10-CM | POA: Diagnosis present

## 2020-12-06 DIAGNOSIS — K029 Dental caries, unspecified: Secondary | ICD-10-CM | POA: Diagnosis present

## 2020-12-06 DIAGNOSIS — H9190 Unspecified hearing loss, unspecified ear: Secondary | ICD-10-CM | POA: Diagnosis present

## 2020-12-06 DIAGNOSIS — I5022 Chronic systolic (congestive) heart failure: Secondary | ICD-10-CM | POA: Diagnosis present

## 2020-12-06 DIAGNOSIS — E876 Hypokalemia: Secondary | ICD-10-CM

## 2020-12-06 DIAGNOSIS — K2101 Gastro-esophageal reflux disease with esophagitis, with bleeding: Secondary | ICD-10-CM | POA: Diagnosis not present

## 2020-12-06 DIAGNOSIS — F0390 Unspecified dementia without behavioral disturbance: Secondary | ICD-10-CM | POA: Diagnosis present

## 2020-12-06 DIAGNOSIS — D631 Anemia in chronic kidney disease: Secondary | ICD-10-CM | POA: Diagnosis present

## 2020-12-06 HISTORY — DX: COVID-19: U07.1

## 2020-12-06 LAB — BASIC METABOLIC PANEL
Anion gap: 8 (ref 5–15)
BUN: 22 mg/dL (ref 8–23)
CO2: 28 mmol/L (ref 22–32)
Calcium: 8.5 mg/dL — ABNORMAL LOW (ref 8.9–10.3)
Chloride: 104 mmol/L (ref 98–111)
Creatinine, Ser: 2.53 mg/dL — ABNORMAL HIGH (ref 0.44–1.00)
GFR, Estimated: 18 mL/min — ABNORMAL LOW (ref >60)
Glucose, Bld: 93 mg/dL (ref 70–99)
Potassium: 3.2 mmol/L — ABNORMAL LOW (ref 3.5–5.1)
Sodium: 140 mmol/L (ref 135–145)

## 2020-12-06 LAB — CBC
HCT: 32.7 % — ABNORMAL LOW (ref 36.0–46.0)
Hemoglobin: 10.5 g/dL — ABNORMAL LOW (ref 12.0–15.0)
MCH: 30.5 pg (ref 26.0–34.0)
MCHC: 32.1 g/dL (ref 30.0–36.0)
MCV: 95.1 fL (ref 80.0–100.0)
Platelets: 214 10*3/uL (ref 150–400)
RBC: 3.44 MIL/uL — ABNORMAL LOW (ref 3.87–5.11)
RDW: 16.3 % — ABNORMAL HIGH (ref 11.5–15.5)
WBC: 9.2 10*3/uL (ref 4.0–10.5)
nRBC: 0 % (ref 0.0–0.2)

## 2020-12-06 MED ORDER — ALPRAZOLAM 0.25 MG PO TABS
0.2500 mg | ORAL_TABLET | Freq: Once | ORAL | Status: AC
  Administered 2020-12-06: 0.25 mg via ORAL
  Filled 2020-12-06: qty 1

## 2020-12-06 MED ORDER — POTASSIUM CHLORIDE 10 MEQ/100ML IV SOLN
10.0000 meq | INTRAVENOUS | Status: AC
Start: 2020-12-06 — End: 2020-12-06
  Administered 2020-12-06 (×4): 10 meq via INTRAVENOUS
  Filled 2020-12-06 (×4): qty 100

## 2020-12-06 NOTE — Progress Notes (Signed)
PROGRESS NOTE  Tanya Koch [id number] DOB: 1928-12-10 DOA: 12/05/2020 PCP: Earmon Phoenix, NP   LOS: 0 days   Brief narrative:  Tanya Koch is a 87 y.o. female with medical history significant for dementia and GERD from assisted living facility presented to hospital with 1 episode of hematemesis after having several episodes of emesis thought to be secondary to antibiotic that she was taking for dental infection.  In the ED patient had stable vitals.  Hemoglobin was 11.0.  Creatinine was elevated at 2.4.  Abdominal x-ray showed the mild costophrenic angle scaring.  In the ED patient had a 2 episodes of vomiting.  GI was consulted and patient was admitted hospital for further evaluation and treatment.  Assessment/Plan:  Active Problems:   Hematemesis   Dementia without behavioral disturbance (HCC)   GERD (gastroesophageal reflux disease)  Hematemesis:  No further hematemesis.  Had one episode.  GI has seen the patient.  Due to advanced age conservative management has been planned at this time.  We will continue IV fluids, IV PPI.    Mild anemia chronic Close monitor hemoglobin  Hypokalemia. Potassium level 3.2.  Will replace with IV KCl.  chronic kidney disease Creatinine 2.5.  Baseline around 2.4.  GERD: Continue Protonix twice a day for now due to GI bleed.  Dementia Stable at this time.  Dental caries/Abscess: On amoxicillin from outpatient.  Will be continued.  Elevated blood pressure with no known history of hypertension Continue to monitor.  Patient likely has hypertension and might benefit from oral antihypertensive on discharge.  DVT prophylaxis: SCDs Start: 12/05/20 1916   Code Status: DNR  Family Communication: None today.  Status is: Observation  The patient will require care spanning > 2 midnights and should be moved to inpatient because: Unsafe d/c plan, IV treatments appropriate due to intensity of illness or inability to take PO, and Inpatient  level of care appropriate due to severity of illness  Dispo: The patient is from: ALF              Anticipated d/c is to: ALF              Patient currently is not medically stable to d/c.   Difficult to place patient No  Consultants: GI  Procedures: None  Anti-infectives:  Amoxicillin  Anti-infectives (From admission, onward)    Start     Dose/Rate Route Frequency Ordered Stop   12/05/20 2200  amoxicillin (AMOXIL) capsule 500 mg  Status:  Discontinued        500 mg Oral Every 8 hours 12/05/20 1915 12/05/20 1942   12/05/20 2000  amoxicillin (AMOXIL) capsule 500 mg        500 mg Oral Every 12 hours 12/05/20 1942 12/28/20 2159      Subjective: Today, patient was seen and examined at bedside.  No further episodes of hematemesis reported to me.  Patient is extremely hard of hearing.  Objective: Vitals:   12/06/20 0225 12/06/20 0458  BP: (!) 148/59 (!) 172/82  Pulse: (!) 56 (!) 55  Resp:    Temp: 97.9 F (36.6 C) 98.2 F (36.8 C)  SpO2: 96% 100%    Intake/Output Summary (Last 24 hours) at 12/06/2020 1404 Last data filed at 12/06/2020 0458 Gross per 24 hour  Intake 3.57 ml  Output 200 ml  Net -196.43 ml   Filed Weights   12/05/20 1928 12/06/20 0300  Weight: 72.6 kg 67.1 kg   Body mass index is 27.06 kg/m.   Physical  Exam:  GENERAL: Patient is alert awake and communicative but extremely hard of hearing not in obvious distress.  Elderly female. HENT: No scleral pallor or icterus. Pupils equally reactive to light. Oral mucosa is moist NECK: is supple, no gross swelling noted. CHEST: Clear to auscultation. No crackles or wheezes.  Diminished breath sounds bilaterally. CVS: S1 and S2 heard, no murmur. Regular rate and rhythm.  ABDOMEN: Soft, non-tender, bowel sounds are present. EXTREMITIES: No edema. CNS: Cranial nerves are intact. No focal motor deficits. SKIN: warm and dry without rashes.  Data Review: I have personally reviewed the following laboratory  data and studies,  CBC: Recent Labs  Lab 12/04/20 2108 12/05/20 1408 12/06/20 0319  WBC 12.3* 10.6* 9.2  NEUTROABS 8.9* 7.9*  --   HGB 11.2* 11.0* 10.5*  HCT 34.3* 35.1* 32.7*  MCV 94.2 97.5 95.1  PLT 242 234 [id number]   Basic Metabolic Panel: Recent Labs  Lab 12/04/20 2108 12/05/20 1408 12/06/20 0319  NA 139 139 140  K 3.4* 3.5 3.2*  CL 98 102 104  CO2 '30 28 28  '$ GLUCOSE 114* 130* 93  BUN 24* 22 22  CREATININE 2.55* 2.42* 2.53*  CALCIUM 9.1 8.6* 8.5*  MG  --  2.0  --    Liver Function Tests: Recent Labs  Lab 12/04/20 2108 12/05/20 1408  AST 14* 17  ALT 6 9  ALKPHOS 45 44  BILITOT 0.4 0.7  PROT 6.9 6.8  ALBUMIN 3.4* 3.2*   Recent Labs  Lab 12/04/20 2108 12/05/20 1408  LIPASE 36 40   No results for input(s): AMMONIA in the last 168 hours. Cardiac Enzymes: No results for input(s): CKTOTAL, CKMB, CKMBINDEX, TROPONINI in the last 168 hours. BNP (last 3 results) No results for input(s): BNP in the last 8760 hours.  ProBNP (last 3 results) No results for input(s): PROBNP in the last 8760 hours.  CBG: No results for input(s): GLUCAP in the last 168 hours. Recent Results (from the past 240 hour(s))  Resp Panel by RT-PCR (Flu A&B, Covid) Nasopharyngeal Swab     Status: Abnormal   Collection Time: 12/05/20  3:33 PM   Specimen: Nasopharyngeal Swab; Nasopharyngeal(NP) swabs in vial transport medium  Result Value Ref Range Status   SARS Coronavirus 2 by RT PCR POSITIVE (A) NEGATIVE Final    Comment: CRITICAL RESULT CALLED TO, READ BACK BY AND VERIFIED WITH:  KISER,C RN ON 12/05/20 @ [facility] (NOTE) SARS-CoV-2 target nucleic acids are DETECTED.  The SARS-CoV-2 RNA is generally detectable in upper respiratory specimens during the acute phase of infection. Positive results are indicative of the presence of the identified virus, but do not rule out bacterial infection or co-infection with other pathogens not detected by the test. Clinical correlation with  patient history and other diagnostic information is necessary to determine patient infection status. The expected result is Negative.  Fact Sheet for Patients: EntrepreneurPulse.com.au  Fact Sheet for Healthcare Providers: IncredibleEmployment.be  This test is not yet approved or cleared by the [place] FDA and  has been authorized for detection and/or diagnosis of SARS-CoV-2 by FDA under an Emergency Use Authorization (EUA).  This EUA will remain in effect (meaning  this test can be used) for the duration of  the COVID-19 declaration under Section 564(b)(1) of the Act, 21 U.S.C. section 360bbb-3(b)(1), unless the authorization is terminated or revoked sooner.     Influenza A by PCR NEGATIVE NEGATIVE Final   Influenza B by PCR NEGATIVE NEGATIVE Final  Comment: (NOTE) The Xpert Xpress SARS-CoV-2/FLU/RSV plus assay is intended as an aid in the diagnosis of influenza from Nasopharyngeal swab specimens and should not be used as a sole basis for treatment. Nasal washings and aspirates are unacceptable for Xpert Xpress SARS-CoV-2/FLU/RSV testing.  Fact Sheet for Patients: EntrepreneurPulse.com.au  Fact Sheet for Healthcare Providers: IncredibleEmployment.be  This test is not yet approved or cleared by the [place] FDA and has been authorized for detection and/or diagnosis of SARS-CoV-2 by FDA under an Emergency Use Authorization (EUA). This EUA will remain in effect (meaning this test can be used) for the duration of the COVID-19 declaration under Section 564(b)(1) of the Act, 21 U.S.C. section 360bbb-3(b)(1), unless the authorization is terminated or revoked.  Performed at [facility], [place] [address] [place], [place] [address]      Studies: DG Abdomen Acute W/Chest  Result Date: 12/05/2020 CLINICAL DATA:  Hematemesis, decreased appetite EXAM: DG ABDOMEN ACUTE WITH 1 VIEW  CHEST COMPARISON:  None. FINDINGS: Top-normal heart size. Mildly tortuous atherosclerotic thoracic aorta. Otherwise normal mediastinal contour. No pneumothorax. No pleural effusion. Mild linear left costophrenic angle scarring versus atelectasis. No pulmonary edema. No consolidative airspace disease. No dilated small bowel loops or air-fluid levels. Mild colonic stool. No evidence of pneumatosis or pneumoperitoneum. Mild levocurvature of the lumbar spine with associated degenerative changes. No radiopaque nephrolithiasis. IMPRESSION: 1. Mild left costophrenic angle scarring versus atelectasis. Otherwise no active cardiopulmonary disease. 2. Nonobstructive bowel gas pattern. Electronically Signed   By: Ilona Sorrel M.D.   On: 12/05/2020 15:06      Flora Lipps, MD  [facility] 12/06/2020  If 7PM-7AM, please contact night-coverage

## 2020-12-06 NOTE — Progress Notes (Signed)
Report called to nurse room 1502. Notified son on transfer of pt.

## 2020-12-06 NOTE — Consult Note (Signed)
Referring Provider: Dr. Cristal Deer  Primary Care Physician:  Earmon Phoenix, NP Primary Gastroenterologist:  Althia Forts  Reason for Consultation:  Hematemesis  HPI: Tanya Koch is a 87 y.o. female with past medical history significant for Dementia without behavioral disturbance (HCC) and GERD (gastroesophageal reflux disease), heart failure with EF 25% presents to ER with hematemesis.   Information obtained from notes and Son Gershon Mussel, patient is very hard of hearing with advanced dementia  Patient was in the ER from Assisted living facility memory care on December 04 2020 for several episodes of emesis which was thought to be due to antibiotics she was taking for dental abscess. Given IV hydration and released back to the nursing facility but returned  due to one episode of bloody emesis during breakfast -bright red blood and bit of clot.   Patient with positive COVID this admission.   Per nurse she has not had any further episodes of vomiting. She is on clear liquids.  She has not had a bowel movement this visit.  History of diverticulitis per son, no history of GI bleeds, no history of dysphgia, AB pain but states with patient's dementia she has no short term memory.  She has had decreased appetite due to recent tooth infection.  Patient is not on any anticoagulation  BUN 44 Cr 2.43 on 07/30/20, this admission BUN 22 Cr 2.42 at her baseline.  HGB 12 on 07/30/20, 11.2 to 10.5 WBC down trending 12.3 to 9.2.  INR 1.0  Blood pressure (!) 172/82, pulse (!) 55, temperature 98.2 F (36.8 C), temperature source Oral, resp. rate 16, height '5\' 2"'$  (1.575 m), weight 67.1 kg, SpO2 100 %.   History reviewed. No pertinent past medical history.  History reviewed. No pertinent surgical history.  Prior to Admission medications   Medication Sig Start Date End Date Taking? Authorizing Provider  ALPRAZolam (XANAX) 0.25 MG tablet Take 0.25 mg by mouth daily as needed. 07/15/20   [provider]   amLODipine (NORVASC) 5 MG tablet Take 5 mg by mouth daily. 06/21/20   [provider]  amoxicillin (AMOXIL) 500 MG capsule Take 500 mg by mouth 3 (three) times daily. 11/30/20   [provider]  carvedilol (COREG) 3.125 MG tablet Take 3.125 mg by mouth 2 (two) times daily. 07/07/20   [provider]  escitalopram (LEXAPRO) 10 MG tablet Take 10 mg by mouth daily. 07/15/20   [provider]  furosemide (LASIX) 20 MG tablet Take 20 mg by mouth daily. 07/15/20   [provider]  ondansetron (ZOFRAN) 4 MG tablet Take 1 tablet (4 mg total) by mouth every 8 (eight) hours as needed for nausea or vomiting. 12/04/20   Curatolo, Adam, DO  pantoprazole (PROTONIX) 40 MG tablet Take 40 mg by mouth daily. 07/08/20   [provider]  rosuvastatin (CRESTOR) 10 MG tablet Take 10 mg by mouth at bedtime. 06/20/20   [provider]    Scheduled Meds:  amoxicillin  500 mg Oral Q12H   pantoprazole (PROTONIX) IV  40 mg Intravenous BID   Continuous Infusions:  sodium chloride 75 mL/hr at 12/06/20 0257   potassium chloride     PRN Meds:.ondansetron  Allergies as of 12/05/2020   (No Known Allergies)    History reviewed. No pertinent family history.  Social History   Socioeconomic History   Marital status: Widowed    Spouse name: Not on file   Number of children: Not on file   Years of education: Not on file  Highest education level: Not on file  Occupational History   Not on file  Tobacco Use   Smoking status: Not on file   Smokeless tobacco: Not on file  Substance and Sexual Activity   Alcohol use: Not on file   Drug use: Not on file   Sexual activity: Not on file  Other Topics Concern   Not on file  Social History Narrative   Not on file   Social Determinants of Health   Financial Resource Strain: Not on file  Food Insecurity: Not on file  Transportation Needs: Not on file  Physical Activity: Not on file  Stress: Not on file   Social Connections: Not on file  Intimate Partner Violence: Not on file    Review of Systems:  Review of Systems  Reason unable to perform ROS: Patient Hard of Hearing. - Unable to obtain due to hearing and dementia   Physical Exam: Vital signs: Vitals:   12/06/20 0225 12/06/20 0458  BP: (!) 148/59 (!) 172/82  Pulse: (!) 56 (!) 55  Resp:    Temp: 97.9 F (36.6 C) 98.2 F (36.8 C)  SpO2: 96% 100%   Last BM Date:  (unknown) Physical Exam HENT:     Right Ear: Decreased hearing noted.     Left Ear: Decreased hearing noted.  Cardiovascular:     Rate and Rhythm: Normal rate. Rhythm irregular.  Pulmonary:     Effort: Pulmonary effort is normal.     Breath sounds: Normal breath sounds.  Abdominal:     General: Bowel sounds are normal.     Palpations: Abdomen is soft.     Tenderness: There is no abdominal tenderness. There is no guarding or rebound.  Skin:    General: Skin is warm and dry.     Coloration: Skin is not jaundiced.  Neurological:     General: No focal deficit present.     Mental Status: Mental status is at baseline.  Psychiatric:        Mood and Affect: Mood and affect normal.        Cognition and Memory: Memory is impaired.     GI:  Lab Results: Recent Labs    12/04/20 2108 12/05/20 1408 12/06/20 0319  WBC 12.3* 10.6* 9.2  HGB 11.2* 11.0* 10.5*  HCT 34.3* 35.1* 32.7*  PLT 242 234 214   BMET Recent Labs    12/04/20 2108 12/05/20 1408 12/06/20 0319  NA 139 139 140  K 3.4* 3.5 3.2*  CL 98 102 104  CO2 '30 28 28  '$ GLUCOSE 114* 130* 93  BUN 24* 22 22  CREATININE 2.55* 2.42* 2.53*  CALCIUM 9.1 8.6* 8.5*   LFT Recent Labs    12/05/20 1408  PROT 6.8  ALBUMIN 3.2*  AST 17  ALT 9  ALKPHOS 44  BILITOT 0.7   PT/INR Recent Labs    12/05/20 1408  LABPROT 13.3  INR 1.0    Studies/Results: DG Abdomen Acute W/Chest  Result Date: 12/05/2020 CLINICAL DATA:  Hematemesis, decreased appetite EXAM: DG ABDOMEN ACUTE WITH 1 VIEW CHEST  COMPARISON:  None. FINDINGS: Top-normal heart size. Mildly tortuous atherosclerotic thoracic aorta. Otherwise normal mediastinal contour. No pneumothorax. No pleural effusion. Mild linear left costophrenic angle scarring versus atelectasis. No pulmonary edema. No consolidative airspace disease. No dilated small bowel loops or air-fluid levels. Mild colonic stool. No evidence of pneumatosis or pneumoperitoneum. Mild levocurvature of the lumbar spine with associated degenerative changes. No radiopaque nephrolithiasis. IMPRESSION: 1. Mild left costophrenic  angle scarring versus atelectasis. Otherwise no active cardiopulmonary disease. 2. Nonobstructive bowel gas pattern. Electronically Signed   By: Ilona Sorrel M.D.   On: 12/05/2020 15:06    Impression Hematemesis one episode after multiple episodes of vomiting.  Likely Mallory Weiss tear  BUN 44 Cr 2.43 on 07/30/20, this admission BUN 22 Cr 2.42 at her baseline.  HGB 12 on 07/30/20, 11.2 to 10.5 WBC down trending 12.3 to 9.2.  INR 1.0  Dementia and hard of hearing  Heart failure per Son with EF 25%, no repeat echo.  COVID positive  Plan Baseline kidney function without elevation of BUN/Cr Slight decrease in HGB however likely a component of dilutional anemia No history of GI bleeds and no symptoms prior to this episode.  Margorie John denies dysphagia, AB pain, GERD. I had a very long discussion on the phone with Gershon Mussel, the son, about performing an endoscopy and the risks involved.  At this time due to his mother's advanced dementia he would prefer conservative measures Continue PPI BID Continue diet of full liquids and mechanical soft diet per dietitian If any reoccurrence of hematemesis or worsening anemia will gladly perform endoscopy.    LOS: 0 days   Vladimir Crofts  PA-C 12/06/2020, 7:57 AM  Contact #  [phone]

## 2020-12-06 NOTE — Plan of Care (Signed)
  Problem: Education: Goal: Knowledge of risk factors and measures for prevention of condition will improve Outcome: Not Progressing   Problem: Coping: Goal: Psychosocial and spiritual needs will be supported Outcome: Progressing   Problem: Respiratory: Goal: Will maintain a patent airway Outcome: Progressing Goal: Complications related to the disease process, condition or treatment will be avoided or minimized Outcome: Progressing   Problem: Education: Goal: Ability to identify signs and symptoms of gastrointestinal bleeding will improve Outcome: Not Progressing   Problem: Bowel/Gastric: Goal: Will show no signs and symptoms of gastrointestinal bleeding Outcome: Progressing   Problem: Fluid Volume: Goal: Will show no signs and symptoms of excessive bleeding Outcome: Progressing   Problem: Clinical Measurements: Goal: Complications related to the disease process, condition or treatment will be avoided or minimized Outcome: Progressing

## 2020-12-06 NOTE — Progress Notes (Signed)
Patient extremely hard of hearing. Communication nearly impossible. Admission assessment to be completed when patient's son arrives

## 2020-12-06 NOTE — Progress Notes (Signed)
Patient confused/impulsive/poor safety awareness.  Initiated waist restraint.  No complaints noted

## 2020-12-06 NOTE — Plan of Care (Signed)
  Problem: Bowel/Gastric: Goal: Will show no signs and symptoms of gastrointestinal bleeding Outcome: Progressing   

## 2020-12-06 NOTE — ED Notes (Signed)
ED TO INPATIENT HANDOFF REPORT  Name/Age/Gender Tanya Koch 87 y.o. female  Code Status    Code Status Orders  (From admission, onward)           Start     Ordered   12/05/20 1916  Do not attempt resuscitation (DNR)  Continuous       Question Answer Comment  In the event of cardiac or respiratory ARREST Do not call a "code blue"   In the event of cardiac or respiratory ARREST Do not perform Intubation, CPR, defibrillation or ACLS   In the event of cardiac or respiratory ARREST Use medication by any route, position, wound care, and other measures to relive pain and suffering. May use oxygen, suction and manual treatment of airway obstruction as needed for comfort.      12/05/20 1915           Code Status History     This patient has a current code status but no historical code status.       Home/SNF/Other Nursing Home  Chief Complaint Hematemesis [K92.0]  Level of Care/Admitting Diagnosis ED Disposition     ED Disposition  Admit   Condition  --   Comment  Hospital Area: [facility] [[id number]]  Level of Care: Med-Surg [16]  May place patient in observation at [facility] or [place] if equivalent level of care is available:: No  Covid Evaluation: Asymptomatic Screening Protocol (No Symptoms)  Diagnosis: Hematemesis [578.0.ICD-9-CM]  Admitting Physician: Cristal Deer [id number]  Attending Physician: Cristal Deer [id number]          Medical History History reviewed. No pertinent past medical history.  Allergies No Known Allergies  IV Location/Drains/Wounds Patient Lines/Drains/Airways Status     Active Line/Drains/Airways     None            Labs/Imaging Results for orders placed or performed during the hospital encounter of 12/05/20 (from the past 48 hour(s))  CBC with Differential     Status: Abnormal   Collection Time: 12/05/20  2:08 PM  Result Value Ref Range   WBC 10.6 (H) 4.0 - 10.5 K/uL   RBC 3.60  (L) 3.87 - 5.11 MIL/uL   Hemoglobin 11.0 (L) 12.0 - 15.0 g/dL   HCT 35.1 (L) 36.0 - 46.0 %   MCV 97.5 80.0 - 100.0 fL   MCH 30.6 26.0 - 34.0 pg   MCHC 31.3 30.0 - 36.0 g/dL   RDW 16.2 (H) 11.5 - 15.5 %   Platelets 234 150 - 400 K/uL   nRBC 0.0 0.0 - 0.2 %   Neutrophils Relative % 74 %   Neutro Abs 7.9 (H) 1.7 - 7.7 K/uL   Lymphocytes Relative 11 %   Lymphs Abs 1.2 0.7 - 4.0 K/uL   Monocytes Relative 11 %   Monocytes Absolute 1.2 (H) 0.1 - 1.0 K/uL   Eosinophils Relative 3 %   Eosinophils Absolute 0.3 0.0 - 0.5 K/uL   Basophils Relative 1 %   Basophils Absolute 0.1 0.0 - 0.1 K/uL   Immature Granulocytes 0 %   Abs Immature Granulocytes 0.04 0.00 - 0.07 K/uL    Comment: Performed at [facility], [place] [address] [place], [place] [address]  Comprehensive metabolic panel     Status: Abnormal   Collection Time: 12/05/20  2:08 PM  Result Value Ref Range   Sodium 139 135 - 145 mmol/L   Potassium 3.5 3.5 - 5.1 mmol/L   Chloride 102 98 -  111 mmol/L   CO2 28 22 - 32 mmol/L   Glucose, Bld 130 (H) 70 - 99 mg/dL    Comment: Glucose reference range applies only to samples taken after fasting for at least 8 hours.   BUN 22 8 - 23 mg/dL   Creatinine, Ser 2.42 (H) 0.44 - 1.00 mg/dL   Calcium 8.6 (L) 8.9 - 10.3 mg/dL   Total Protein 6.8 6.5 - 8.1 g/dL   Albumin 3.2 (L) 3.5 - 5.0 g/dL   AST 17 15 - 41 U/L   ALT 9 0 - 44 U/L   Alkaline Phosphatase 44 38 - 126 U/L   Total Bilirubin 0.7 0.3 - 1.2 mg/dL   GFR, Estimated 19 (L) >60 mL/min    Comment: (NOTE) Calculated using the CKD-EPI Creatinine Equation (2021)    Anion gap 9 5 - 15    Comment: Performed at [facility], [place] [address] [place], [place] [address]  Lipase, blood     Status: None   Collection Time: 12/05/20  2:08 PM  Result Value Ref Range   Lipase 40 11 - 51 U/L    Comment: Performed at [facility], [place] [address] [place], [place] [address]  Protime-INR     Status:  None   Collection Time: 12/05/20  2:08 PM  Result Value Ref Range   Prothrombin Time 13.3 11.4 - 15.2 seconds   INR 1.0 0.8 - 1.2    Comment: (NOTE) INR goal varies based on device and disease states. Performed at [facility], [place] [address] [place], [place] [address]   Magnesium     Status: None   Collection Time: 12/05/20  2:08 PM  Result Value Ref Range   Magnesium 2.0 1.7 - 2.4 mg/dL    Comment: Performed at [facility], [place] [address] [place], [place] [address]  Type and screen [place]     Status: None   Collection Time: 12/05/20  2:23 PM  Result Value Ref Range   ABO/RH(D) O POS    Antibody Screen NEG    Sample Expiration      12/08/2020,2359 Performed at [place] [address], [place], [place] [address]   ABO/Rh     Status: None   Collection Time: 12/05/20  2:28 PM  Result Value Ref Range   ABO/RH(D)      O POS Performed at [facility], [place] [address] [place], [place] [address]   Resp Panel by RT-PCR (Flu A&B, Covid) Nasopharyngeal Swab     Status: Abnormal   Collection Time: 12/05/20  3:33 PM   Specimen: Nasopharyngeal Swab; Nasopharyngeal(NP) swabs in vial transport medium  Result Value Ref Range   SARS Coronavirus 2 by RT PCR POSITIVE (A) NEGATIVE    Comment: CRITICAL RESULT CALLED TO, READ BACK BY AND VERIFIED WITH:  KISER,C RN ON 12/05/20 @ [facility] (NOTE) SARS-CoV-2 target nucleic acids are DETECTED.  The SARS-CoV-2 RNA is generally detectable in upper respiratory specimens during the acute phase of infection. Positive results are indicative of the presence of the identified virus, but do not rule out bacterial infection or co-infection with other pathogens not detected by the test. Clinical correlation with patient history and other diagnostic information is necessary to determine patient infection status. The expected result is  Negative.  Fact Sheet for Patients: EntrepreneurPulse.com.au  Fact Sheet for Healthcare Providers: IncredibleEmployment.be  This test is not yet approved or  cleared by the [place] and  has been authorized for detection and/or diagnosis of SARS-CoV-2 by FDA under an Emergency Use Authorization (EUA).  This EUA will remain in effect (meaning  this test can be used) for the duration of  the COVID-19 declaration under Section 564(b)(1) of the Act, 21 U.S.C. section 360bbb-3(b)(1), unless the authorization is terminated or revoked sooner.     Influenza A by PCR NEGATIVE NEGATIVE   Influenza B by PCR NEGATIVE NEGATIVE    Comment: (NOTE) The Xpert Xpress SARS-CoV-2/FLU/RSV plus assay is intended as an aid in the diagnosis of influenza from Nasopharyngeal swab specimens and should not be used as a sole basis for treatment. Nasal washings and aspirates are unacceptable for Xpert Xpress SARS-CoV-2/FLU/RSV testing.  Fact Sheet for Patients: EntrepreneurPulse.com.au  Fact Sheet for Healthcare Providers: IncredibleEmployment.be  This test is not yet approved or cleared by the [place] FDA and has been authorized for detection and/or diagnosis of SARS-CoV-2 by FDA under an Emergency Use Authorization (EUA). This EUA will remain in effect (meaning this test can be used) for the duration of the COVID-19 declaration under Section 564(b)(1) of the Act, 21 U.S.C. section 360bbb-3(b)(1), unless the authorization is terminated or revoked.  Performed at [facility], [place] [address] [place], [place] [address]    DG Abdomen Acute W/Chest  Result Date: 12/05/2020 CLINICAL DATA:  Hematemesis, decreased appetite EXAM: DG ABDOMEN ACUTE WITH 1 VIEW CHEST COMPARISON:  None. FINDINGS: Top-normal heart size. Mildly tortuous atherosclerotic thoracic aorta. Otherwise normal mediastinal contour. No  pneumothorax. No pleural effusion. Mild linear left costophrenic angle scarring versus atelectasis. No pulmonary edema. No consolidative airspace disease. No dilated small bowel loops or air-fluid levels. Mild colonic stool. No evidence of pneumatosis or pneumoperitoneum. Mild levocurvature of the lumbar spine with associated degenerative changes. No radiopaque nephrolithiasis. IMPRESSION: 1. Mild left costophrenic angle scarring versus atelectasis. Otherwise no active cardiopulmonary disease. 2. Nonobstructive bowel gas pattern. Electronically Signed   By: Ilona Sorrel M.D.   On: 12/05/2020 15:06    Pending Labs Unresulted Labs (From admission, onward)     Start     Ordered   12/06/20 [id number]  Basic metabolic panel  Tomorrow morning,   R        12/05/20 1915   12/06/20 0500  CBC  Tomorrow morning,   R        12/05/20 1915            Vitals/Pain Today's Vitals   12/05/20 1800 12/05/20 1928 12/05/20 2211 12/06/20 0119  BP: (!) 153/57  (!) 156/66 (!) 149/73  Pulse: (!) 54  72 60  Resp: '19  15 16  '$ Temp:      SpO2: 96%  97% 94%  Weight:  72.6 kg    Height:  '5\' 6"'$  (1.676 m)    PainSc:        Isolation Precautions No active isolations  Medications Medications  pantoprazole (PROTONIX) injection 40 mg (has no administration in time range)  ondansetron (ZOFRAN) tablet 4 mg (has no administration in time range)  0.9 %  sodium chloride infusion (has no administration in time range)  amoxicillin (AMOXIL) capsule 500 mg (has no administration in time range)    Mobility walks with person assist

## 2020-12-07 LAB — CBC
HCT: 30.2 % — ABNORMAL LOW (ref 36.0–46.0)
Hemoglobin: 9.8 g/dL — ABNORMAL LOW (ref 12.0–15.0)
MCH: 31.4 pg (ref 26.0–34.0)
MCHC: 32.5 g/dL (ref 30.0–36.0)
MCV: 96.8 fL (ref 80.0–100.0)
Platelets: 204 10*3/uL (ref 150–400)
RBC: 3.12 MIL/uL — ABNORMAL LOW (ref 3.87–5.11)
RDW: 16.4 % — ABNORMAL HIGH (ref 11.5–15.5)
WBC: 8.9 10*3/uL (ref 4.0–10.5)
nRBC: 0 % (ref 0.0–0.2)

## 2020-12-07 LAB — BASIC METABOLIC PANEL
Anion gap: 9 (ref 5–15)
BUN: 20 mg/dL (ref 8–23)
CO2: 26 mmol/L (ref 22–32)
Calcium: 8.6 mg/dL — ABNORMAL LOW (ref 8.9–10.3)
Chloride: 105 mmol/L (ref 98–111)
Creatinine, Ser: 2.26 mg/dL — ABNORMAL HIGH (ref 0.44–1.00)
GFR, Estimated: 20 mL/min — ABNORMAL LOW (ref >60)
Glucose, Bld: 87 mg/dL (ref 70–99)
Potassium: 3.6 mmol/L (ref 3.5–5.1)
Sodium: 140 mmol/L (ref 135–145)

## 2020-12-07 LAB — MAGNESIUM: Magnesium: 1.9 mg/dL (ref 1.7–2.4)

## 2020-12-07 MED ORDER — DIPHENHYDRAMINE HCL 12.5 MG/5ML PO ELIX: 6.2500 mg | ORAL_SOLUTION | Freq: Once | ORAL | Status: DC

## 2020-12-07 MED ORDER — ALPRAZOLAM 0.5 MG PO TABS
0.5000 mg | ORAL_TABLET | Freq: Once | ORAL | Status: AC
  Administered 2020-12-07: 0.5 mg via ORAL
  Filled 2020-12-07: qty 1

## 2020-12-07 MED ORDER — DIPHENHYDRAMINE HCL 50 MG/ML IJ SOLN
6.2500 mg | Freq: Once | INTRAMUSCULAR | Status: AC
  Administered 2020-12-07: 6.5 mg via INTRAVENOUS
  Filled 2020-12-07: qty 1

## 2020-12-07 MED ORDER — AMLODIPINE BESYLATE 5 MG PO TABS
5.0000 mg | ORAL_TABLET | Freq: Every day | ORAL | Status: DC
  Administered 2020-12-07 – 2020-12-08 (×2): 5 mg via ORAL
  Filled 2020-12-07 (×2): qty 1

## 2020-12-07 NOTE — Progress Notes (Signed)
Patient continues to tug at IV line. Attempted soft mitts and patient has pulled them off. Notified provider and fluids have been held until patient is able to comply.

## 2020-12-07 NOTE — Progress Notes (Addendum)
PROGRESS NOTE  Tanya Koch [id number] DOB: 05-30-1928 DOA: 12/05/2020 PCP: Earmon Phoenix, NP   LOS: 1 day   Brief narrative:  Tanya Koch is a 87 y.o. female with medical history significant for dementia and GERD from assisted living facility presented to hospital with 1 episode of hematemesis after having several episodes of emesis thought to be secondary to antibiotic that she was taking for dental infection.  In the ED patient had stable vitals.  Hemoglobin was 11.0.  Creatinine was elevated at 2.4.  Abdominal x-ray showed the mild costophrenic angle scaring.  In the ED, patient had a 2 episodes of vomiting.  GI was consulted and patient was admitted hospital for further evaluation and treatment.  Assessment/Plan:  Active Problems:   Hematemesis   Dementia without behavioral disturbance (HCC)   GERD (gastroesophageal reflux disease)   COVID  Hematemesis: Likely Mallory-Weiss tear. No further hematemesis.  Had one episode.  GI has seen the patient.  Due to advanced age conservative management with Protonix.  Would recommend continuation of Protonix on discharge.  Mild anemia chronic Close monitor hemoglobin.  Latest hemoglobin of 9.8.  Mild hemodilution likely  Hypokalemia. Potassium has improved after replacement.  chronic kidney disease stage IV. Acute kidney injury has been ruled out. creatinine 2.2 today after hydration.  Baseline around 2.4.  Asymptomatic COVID-19 infection.  No indication for treatment at this time.  GERD: Continue Protonix twice a day for now due to GI bleed.  Dementia Stable at this time.  Hard of hearing but communicative.  Dental caries/Abscess: On amoxicillin from outpatient.  Continue to complete the course.  Elevated blood pressure with no known history of hypertension Blood pressure has been steadily high.  Will discontinue IV fluids.  We will start the patient on p.o. antihypertensive. Start low dose amlodipine.   DVT prophylaxis:  SCDs Start: 12/05/20 1916  Code Status: DNR  Family Communication:  Spoke with the patient's son and updated him about the clinical condition of the patient and potential for disposition tomorrow.  Status is: Inpatient  The patient is inpatient: Unsafe d/c plan, IV treatments appropriate due to intensity of illness or inability to take PO, and Inpatient level of care appropriate due to severity of illness, need for closer monitoring  Dispo: The patient is from: ALF              Anticipated d/c is to: ALF, will consult transition of care for tentative discharge tomorrow if hemoglobin remains stable and patient does not have further episodes of vomiting.              Patient currently is not medically stable to d/c.   Difficult to place patient No  Consultants: GI  Procedures: None  Anti-infectives:  Amoxicillin  Anti-infectives (From admission, onward)    Start     Dose/Rate Route Frequency Ordered Stop   12/05/20 2200  amoxicillin (AMOXIL) capsule 500 mg  Status:  Discontinued        500 mg Oral Every 8 hours 12/05/20 1915 12/05/20 1942   12/05/20 2000  amoxicillin (AMOXIL) capsule 500 mg        500 mg Oral Every 12 hours 12/05/20 1942 12/08/20 0959      Subjective: Today, patient was seen and examined at bedside.  No further vomiting reported to me.  Objective: Vitals:   12/07/20 0613 12/07/20 1203  BP:  (!) 174/64  Pulse: (!) 56 (!) 49  Resp: 18 18  Temp:  (!) 97.5 F (36.4  C)  SpO2: 98% 100%    Intake/Output Summary (Last 24 hours) at 12/07/2020 1309 Last data filed at 12/06/2020 2321 Gross per 24 hour  Intake 400 ml  Output --  Net 400 ml    Filed Weights   12/05/20 1928 12/06/20 0300  Weight: 72.6 kg 67.1 kg   Body mass index is 27.06 kg/m.   Physical Exam:  GENERAL: Patient is alert awake and communicative .hard of hearing.   HENT: No scleral pallor or icterus. Pupils equally reactive to light. Oral mucosa is moist NECK: is supple, no gross  swelling noted. CHEST: Clear to auscultation. No crackles or wheezes.  Diminished breath sounds bilaterally. CVS: S1 and S2 heard, no murmur. Regular rate and rhythm.  ABDOMEN: Soft, non-tender, bowel sounds are present. EXTREMITIES: No edema. CNS: Cranial nerves are intact. No focal motor deficits. SKIN: warm and dry without rashes.  Data Review: I have personally reviewed the following laboratory data and studies,  CBC: Recent Labs  Lab 12/04/20 2108 12/05/20 1408 12/06/20 0319 12/07/20 0436  WBC 12.3* 10.6* 9.2 8.9  NEUTROABS 8.9* 7.9*  --   --   HGB 11.2* 11.0* 10.5* 9.8*  HCT 34.3* 35.1* 32.7* 30.2*  MCV 94.2 97.5 95.1 96.8  PLT 242 234 214 0000000    Basic Metabolic Panel: Recent Labs  Lab 12/04/20 2108 12/05/20 1408 12/06/20 0319 12/07/20 0436  NA 139 139 140 140  K 3.4* 3.5 3.2* 3.6  CL 98 102 104 105  CO2 '30 28 28 26  '$ GLUCOSE 114* 130* 93 87  BUN 24* '22 22 20  '$ CREATININE 2.55* 2.42* 2.53* 2.26*  CALCIUM 9.1 8.6* 8.5* 8.6*  MG  --  2.0  --  1.9    Liver Function Tests: Recent Labs  Lab 12/04/20 2108 12/05/20 1408  AST 14* 17  ALT 6 9  ALKPHOS 45 44  BILITOT 0.4 0.7  PROT 6.9 6.8  ALBUMIN 3.4* 3.2*    Recent Labs  Lab 12/04/20 2108 12/05/20 1408  LIPASE 36 40    No results for input(s): AMMONIA in the last 168 hours. Cardiac Enzymes: No results for input(s): CKTOTAL, CKMB, CKMBINDEX, TROPONINI in the last 168 hours. BNP (last 3 results) No results for input(s): BNP in the last 8760 hours.  ProBNP (last 3 results) No results for input(s): PROBNP in the last 8760 hours.  CBG: No results for input(s): GLUCAP in the last 168 hours. Recent Results (from the past 240 hour(s))  Resp Panel by RT-PCR (Flu A&B, Covid) Nasopharyngeal Swab     Status: Abnormal   Collection Time: 12/05/20  3:33 PM   Specimen: Nasopharyngeal Swab; Nasopharyngeal(NP) swabs in vial transport medium  Result Value Ref Range Status   SARS Coronavirus 2 by RT PCR POSITIVE  (A) NEGATIVE Final    Comment: CRITICAL RESULT CALLED TO, READ BACK BY AND VERIFIED WITH:  KISER,C RN ON 12/05/20 @ [facility] (NOTE) SARS-CoV-2 target nucleic acids are DETECTED.  The SARS-CoV-2 RNA is generally detectable in upper respiratory specimens during the acute phase of infection. Positive results are indicative of the presence of the identified virus, but do not rule out bacterial infection or co-infection with other pathogens not detected by the test. Clinical correlation with patient history and other diagnostic information is necessary to determine patient infection status. The expected result is Negative.  Fact Sheet for Patients: EntrepreneurPulse.com.au  Fact Sheet for Healthcare Providers: IncredibleEmployment.be  This test is not yet approved or cleared by the [place]  FDA and  has been authorized for detection and/or diagnosis of SARS-CoV-2 by FDA under an Emergency Use Authorization (EUA).  This EUA will remain in effect (meaning  this test can be used) for the duration of  the COVID-19 declaration under Section 564(b)(1) of the Act, 21 U.S.C. section 360bbb-3(b)(1), unless the authorization is terminated or revoked sooner.     Influenza A by PCR NEGATIVE NEGATIVE Final   Influenza B by PCR NEGATIVE NEGATIVE Final    Comment: (NOTE) The Xpert Xpress SARS-CoV-2/FLU/RSV plus assay is intended as an aid in the diagnosis of influenza from Nasopharyngeal swab specimens and should not be used as a sole basis for treatment. Nasal washings and aspirates are unacceptable for Xpert Xpress SARS-CoV-2/FLU/RSV testing.  Fact Sheet for Patients: EntrepreneurPulse.com.au  Fact Sheet for Healthcare Providers: IncredibleEmployment.be  This test is not yet approved or cleared by the [place] FDA and has been authorized for detection and/or diagnosis of SARS-CoV-2 by FDA under an  Emergency Use Authorization (EUA). This EUA will remain in effect (meaning this test can be used) for the duration of the COVID-19 declaration under Section 564(b)(1) of the Act, 21 U.S.C. section 360bbb-3(b)(1), unless the authorization is terminated or revoked.  Performed at [facility], [place] [address], [place], [place] [address]       Studies: DG Abdomen Acute W/Chest  Result Date: 12/05/2020 CLINICAL DATA:  Hematemesis, decreased appetite EXAM: DG ABDOMEN ACUTE WITH 1 VIEW CHEST COMPARISON:  None. FINDINGS: Top-normal heart size. Mildly tortuous atherosclerotic thoracic aorta. Otherwise normal mediastinal contour. No pneumothorax. No pleural effusion. Mild linear left costophrenic angle scarring versus atelectasis. No pulmonary edema. No consolidative airspace disease. No dilated small bowel loops or air-fluid levels. Mild colonic stool. No evidence of pneumatosis or pneumoperitoneum. Mild levocurvature of the lumbar spine with associated degenerative changes. No radiopaque nephrolithiasis. IMPRESSION: 1. Mild left costophrenic angle scarring versus atelectasis. Otherwise no active cardiopulmonary disease. 2. Nonobstructive bowel gas pattern. Electronically Signed   By: Ilona Sorrel M.D.   On: 12/05/2020 15:06      Flora Lipps, MD  [facility] 12/07/2020  If 7PM-7AM, please contact night-coverage

## 2020-12-07 NOTE — Evaluation (Addendum)
Physical Therapy Evaluation Patient Details Name: Tanya Koch MRN: [id number] DOB: 15-Mar-1928 Today's Date: 12/07/2020  History of Present Illness  87 y.o. female with medical history significant for dementia and GERD who lives in an assisted living facility brought in for evaluation of 1 bloody emesis. Dx of hematemesis likely due to [employer] tear (conservative management), Covid + (asymptomatic and past 10 days of initial positive, so no precautions).  Clinical Impression  Pt admitted with above diagnosis. Pt ambulated 10' x 2 with RW with min to mod assist for balance due to BLEs buckling (several times pt began to lower into a squat position). Pt is severely HOH and did not seem to be able to hear anything I said. Hands on assist recommended when OOB due to fall risk.  Pt currently with functional limitations due to the deficits listed below (see PT Problem List). Pt will benefit from skilled PT to increase their independence and safety with mobility to allow discharge to the venue listed below.          Recommendations for follow up therapy are one component of a multi-disciplinary discharge planning process, led by the attending physician.  Recommendations may be updated based on patient status, additional functional criteria and insurance authorization.  Follow Up Recommendations Supervision for mobility/OOB;Supervision/Assistance - 24 hour (return to memory care unit with hands on assist and use of RW for mobility due to fall risk)    Equipment Recommendations  None recommended by PT    Recommendations for Other Services       Precautions / Restrictions Precautions Precautions: Fall Precaution Comments: pt not oriented, unable to provide fall hx Restrictions Weight Bearing Restrictions: No      Mobility  Bed Mobility Overal bed mobility: Needs Assistance Bed Mobility: Supine to Sit     Supine to sit: Min assist     General bed mobility comments: min A to raise  trunk    Transfers Overall transfer level: Needs assistance Equipment used: Rolling walker (2 wheeled) Transfers: Sit to/from Stand Sit to Stand: Mod assist;Min assist         General transfer comment: min A to power up  Ambulation/Gait Ambulation/Gait assistance: Min assist Gait Distance (Feet): 20 Feet (10' x 2 with seated rest break on commode) Assistive device: Rolling walker (2 wheeled) Gait Pattern/deviations: Step-through pattern Gait velocity: decr   General Gait Details: BLEs buckled in standing several times, 10' x 2 with seated rest on commode, min to mod A for balance 2* to buckling  Stairs            Wheelchair Mobility    Modified Rankin (Stroke Patients Only)       Balance Overall balance assessment: Needs assistance   Sitting balance-Leahy Scale: Good     Standing balance support: Bilateral upper extremity supported Standing balance-Leahy Scale: Poor Standing balance comment: relies on BUE support in standing                             Pertinent Vitals/Pain Pain Assessment: Faces Faces Pain Scale: No hurt    Home Living Family/patient expects to be discharged to:: Assisted living                 Additional Comments: from memory care unit    Prior Function           Comments: unknown, pt very HOH and not oriented, unable to provide PLOF  Hand Dominance        Extremity/Trunk Assessment   Upper Extremity Assessment Upper Extremity Assessment: Overall WFL for tasks assessed    Lower Extremity Assessment Lower Extremity Assessment: Generalized weakness;Difficult to assess due to impaired cognition (buckling of BLEs in unsupported standing)    Cervical / Trunk Assessment Cervical / Trunk Assessment: Kyphotic  Communication   Communication: HOH  Cognition Arousal/Alertness: Awake/alert Behavior During Therapy: WFL for tasks assessed/performed Overall Cognitive Status: Difficult to assess                                  General Comments: noted h/o dementia, pt asked "what are these things" while looking at her hospital arm band and IV, she did not seem to hear anything I said      General Comments      Exercises     Assessment/Plan    PT Assessment Patient needs continued PT services  PT Problem List Decreased mobility;Decreased activity tolerance;Decreased balance       PT Treatment Interventions Therapeutic activities;Gait training;Functional mobility training    PT Goals (Current goals can be found in the Care Plan section)  Acute Rehab PT Goals PT Goal Formulation: Patient unable to participate in goal setting Time For Goal Achievement: 12/21/20 Potential to Achieve Goals: Fair    Frequency Min 2X/week   Barriers to discharge        Co-evaluation               AM-PAC PT "6 Clicks" Mobility  Outcome Measure Help needed turning from your back to your side while in a flat bed without using bedrails?: A Little Help needed moving from lying on your back to sitting on the side of a flat bed without using bedrails?: A Little Help needed moving to and from a bed to a chair (including a wheelchair)?: A Little Help needed standing up from a chair using your arms (e.g., wheelchair or bedside chair)?: A Little Help needed to walk in hospital room?: A Lot Help needed climbing 3-5 steps with a railing? : A Lot 6 Click Score: 16    End of Session Equipment Utilized During Treatment: Gait belt Activity Tolerance: Patient tolerated treatment well Patient left: in bed;with call bell/phone within reach;with bed alarm set;with restraints reapplied (waist belt restraint) Nurse Communication: Mobility status PT Visit Diagnosis: Difficulty in walking, not elsewhere classified (R26.2)    Time: [id number] PT Time Calculation (min) (ACUTE ONLY): 15 min   Charges:   PT Evaluation $PT Eval Moderate Complexity: 1 Mod         Philomena Doheny PT  12/07/2020  Acute Rehabilitation Services Pager [phone] Office [phone]

## 2020-12-07 NOTE — Progress Notes (Signed)
[facility] Progress Note  Tanya Koch 87 y.o. [birth year]  CC:  Hematemesis   Subjective: Discussed with patient's nurse. No overnight events other than patient trying to pull out IV lines and disorientation. No nausea, no vomiting.  No more hematemesis.  Patient did have 1 small bowel movement yesterday, no melena, no hematochezia.  ROS : Difficult to obtain due to patient being hard of hearing and dementia.  Objective: Vital signs in last 24 hours: Vitals:   12/06/20 2248 12/07/20 0613  BP: (!) 185/65   Pulse: 76 (!) 56  Resp: 18 18  Temp: 97.6 F (36.4 C)   SpO2: 96% 98%    Physical Exam: General:    Hard of hearing, pleasant white female in NAD lying in bed Abdomen:  Soft, nontender and nondistended. Normal bowel sounds. Neurologic:  Alert but not oriented,  grossly normal neurologically. Psych:  Cooperative. Normal mood and affect.  Lab Results: Recent Labs    12/05/20 1408 12/06/20 0319 12/07/20 0436  NA 139 140 140  K 3.5 3.2* 3.6  CL 102 104 105  CO2 '28 28 26  '$ GLUCOSE 130* 93 87  BUN '22 22 20  '$ CREATININE 2.42* 2.53* 2.26*  CALCIUM 8.6* 8.5* 8.6*  MG 2.0  --  1.9   Recent Labs    12/04/20 2108 12/05/20 1408  AST 14* 17  ALT 6 9  ALKPHOS 45 44  BILITOT 0.4 0.7  PROT 6.9 6.8  ALBUMIN 3.4* 3.2*   Recent Labs    12/04/20 2108 12/05/20 1408 12/06/20 0319 12/07/20 0436  WBC 12.3* 10.6* 9.2 8.9  NEUTROABS 8.9* 7.9*  --   --   HGB 11.2* 11.0* 10.5* 9.8*  HCT 34.3* 35.1* 32.7* 30.2*  MCV 94.2 97.5 95.1 96.8  PLT 242 234 214 204   Recent Labs    12/05/20 1408  LABPROT 13.3  INR 1.0    Lab Results: Results for orders placed or performed during the hospital encounter of 12/05/20 (from the past 48 hour(s))  CBC with Differential     Status: Abnormal   Collection Time: 12/05/20  2:08 PM  Result Value Ref Range   WBC 10.6 (H) 4.0 - 10.5 K/uL   RBC 3.60 (L) 3.87 - 5.11 MIL/uL   Hemoglobin 11.0 (L) 12.0 - 15.0 g/dL   HCT 35.1  (L) 36.0 - 46.0 %   MCV 97.5 80.0 - 100.0 fL   MCH 30.6 26.0 - 34.0 pg   MCHC 31.3 30.0 - 36.0 g/dL   RDW 16.2 (H) 11.5 - 15.5 %   Platelets 234 150 - 400 K/uL   nRBC 0.0 0.0 - 0.2 %   Neutrophils Relative % 74 %   Neutro Abs 7.9 (H) 1.7 - 7.7 K/uL   Lymphocytes Relative 11 %   Lymphs Abs 1.2 0.7 - 4.0 K/uL   Monocytes Relative 11 %   Monocytes Absolute 1.2 (H) 0.1 - 1.0 K/uL   Eosinophils Relative 3 %   Eosinophils Absolute 0.3 0.0 - 0.5 K/uL   Basophils Relative 1 %   Basophils Absolute 0.1 0.0 - 0.1 K/uL   Immature Granulocytes 0 %   Abs Immature Granulocytes 0.04 0.00 - 0.07 K/uL    Comment: Performed at [facility], [place] [address] [place], [place] [address]  Comprehensive metabolic panel     Status: Abnormal   Collection Time: 12/05/20  2:08 PM  Result Value Ref Range   Sodium 139 135 - 145 mmol/L   Potassium 3.5  3.5 - 5.1 mmol/L   Chloride 102 98 - 111 mmol/L   CO2 28 22 - 32 mmol/L   Glucose, Bld 130 (H) 70 - 99 mg/dL    Comment: Glucose reference range applies only to samples taken after fasting for at least 8 hours.   BUN 22 8 - 23 mg/dL   Creatinine, Ser 2.42 (H) 0.44 - 1.00 mg/dL   Calcium 8.6 (L) 8.9 - 10.3 mg/dL   Total Protein 6.8 6.5 - 8.1 g/dL   Albumin 3.2 (L) 3.5 - 5.0 g/dL   AST 17 15 - 41 U/L   ALT 9 0 - 44 U/L   Alkaline Phosphatase 44 38 - 126 U/L   Total Bilirubin 0.7 0.3 - 1.2 mg/dL   GFR, Estimated 19 (L) >60 mL/min    Comment: (NOTE) Calculated using the CKD-EPI Creatinine Equation (2021)    Anion gap 9 5 - 15    Comment: Performed at [facility], [place] [address], [place], [place] [address]  Lipase, blood     Status: None   Collection Time: 12/05/20  2:08 PM  Result Value Ref Range   Lipase 40 11 - 51 U/L    Comment: Performed at [facility], [place] [address], [place], [place] [address]  Protime-INR     Status: None   Collection Time: 12/05/20  2:08 PM  Result Value Ref Range    Prothrombin Time 13.3 11.4 - 15.2 seconds   INR 1.0 0.8 - 1.2    Comment: (NOTE) INR goal varies based on device and disease states. Performed at [facility], [place] [address], [place], [place] [address]   Magnesium     Status: None   Collection Time: 12/05/20  2:08 PM  Result Value Ref Range   Magnesium 2.0 1.7 - 2.4 mg/dL    Comment: Performed at [facility], [place] [address] [place], [place] [address]  Type and screen [place]     Status: None   Collection Time: 12/05/20  2:23 PM  Result Value Ref Range   ABO/RH(D) O POS    Antibody Screen NEG    Sample Expiration      12/08/2020,2359 Performed at [place] [address], [place], [place] [address]   ABO/Rh     Status: None   Collection Time: 12/05/20  2:28 PM  Result Value Ref Range   ABO/RH(D)      O POS Performed at [facility], [place] [address], [place], [place] [address]   Resp Panel by RT-PCR (Flu A&B, Covid) Nasopharyngeal Swab     Status: Abnormal   Collection Time: 12/05/20  3:33 PM   Specimen: Nasopharyngeal Swab; Nasopharyngeal(NP) swabs in vial transport medium  Result Value Ref Range   SARS Coronavirus 2 by RT PCR POSITIVE (A) NEGATIVE    Comment: CRITICAL RESULT CALLED TO, READ BACK BY AND VERIFIED WITH:  KISER,C RN ON 12/05/20 @ [facility] (NOTE) SARS-CoV-2 target nucleic acids are DETECTED.  The SARS-CoV-2 RNA is generally detectable in upper respiratory specimens during the acute phase of infection. Positive results are indicative of the presence of the identified virus, but do not rule out bacterial infection or co-infection with other pathogens not detected by the test. Clinical correlation with patient history and other diagnostic information is necessary to determine patient infection status. The expected result is Negative.  Fact Sheet for  Patients: EntrepreneurPulse.com.au  Fact Sheet for Healthcare  Providers: IncredibleEmployment.be  This test is not yet approved or cleared by the [place] and  has been authorized for detection and/or diagnosis of SARS-CoV-2 by FDA under an Emergency Use Authorization (EUA).  This EUA will remain in effect (meaning  this test can be used) for the duration of  the COVID-19 declaration under Section 564(b)(1) of the Act, 21 U.S.C. section 360bbb-3(b)(1), unless the authorization is terminated or revoked sooner.     Influenza A by PCR NEGATIVE NEGATIVE   Influenza B by PCR NEGATIVE NEGATIVE    Comment: (NOTE) The Xpert Xpress SARS-CoV-2/FLU/RSV plus assay is intended as an aid in the diagnosis of influenza from Nasopharyngeal swab specimens and should not be used as a sole basis for treatment. Nasal washings and aspirates are unacceptable for Xpert Xpress SARS-CoV-2/FLU/RSV testing.  Fact Sheet for Patients: EntrepreneurPulse.com.au  Fact Sheet for Healthcare Providers: IncredibleEmployment.be  This test is not yet approved or cleared by the [place] FDA and has been authorized for detection and/or diagnosis of SARS-CoV-2 by FDA under an Emergency Use Authorization (EUA). This EUA will remain in effect (meaning this test can be used) for the duration of the COVID-19 declaration under Section 564(b)(1) of the Act, 21 U.S.C. section 360bbb-3(b)(1), unless the authorization is terminated or revoked.  Performed at [facility], [place] [address] [place], [place] [id number]   Basic metabolic panel     Status: Abnormal   Collection Time: 12/06/20  3:19 AM  Result Value Ref Range   Sodium 140 135 - 145 mmol/L   Potassium 3.2 (L) 3.5 - 5.1 mmol/L   Chloride 104 98 - 111 mmol/L   CO2 28 22 - 32 mmol/L   Glucose, Bld 93 70 - 99 mg/dL    Comment: Glucose reference range applies only  to samples taken after fasting for at least 8 hours.   BUN 22 8 - 23 mg/dL   Creatinine, Ser 2.53 (H) 0.44 - 1.00 mg/dL   Calcium 8.5 (L) 8.9 - 10.3 mg/dL   GFR, Estimated 18 (L) >60 mL/min    Comment: (NOTE) Calculated using the CKD-EPI Creatinine Equation (2021)    Anion gap 8 5 - 15    Comment: Performed at [facility], [place] [address], [place], [place] [address]  CBC     Status: Abnormal   Collection Time: 12/06/20  3:19 AM  Result Value Ref Range   WBC 9.2 4.0 - 10.5 K/uL   RBC 3.44 (L) 3.87 - 5.11 MIL/uL   Hemoglobin 10.5 (L) 12.0 - 15.0 g/dL   HCT 32.7 (L) 36.0 - 46.0 %   MCV 95.1 80.0 - 100.0 fL   MCH 30.5 26.0 - 34.0 pg   MCHC 32.1 30.0 - 36.0 g/dL   RDW 16.3 (H) 11.5 - 15.5 %   Platelets 214 150 - 400 K/uL   nRBC 0.0 0.0 - 0.2 %    Comment: Performed at [facility], [place] [address] [place], [place] [id number]  Basic metabolic panel     Status: Abnormal   Collection Time: 12/07/20  4:36 AM  Result Value Ref Range   Sodium 140 135 - 145 mmol/L   Potassium 3.6 3.5 - 5.1 mmol/L   Chloride 105 98 - 111 mmol/L   CO2 26 22 - 32 mmol/L   Glucose, Bld 87 70 - 99 mg/dL    Comment: Glucose reference range applies only to samples taken after fasting for at least 8 hours.  BUN 20 8 - 23 mg/dL   Creatinine, Ser 2.26 (H) 0.44 - 1.00 mg/dL   Calcium 8.6 (L) 8.9 - 10.3 mg/dL   GFR, Estimated 20 (L) >60 mL/min    Comment: (NOTE) Calculated using the CKD-EPI Creatinine Equation (2021)    Anion gap 9 5 - 15    Comment: Performed at [facility], [place] [address] [place], [place] [address]  CBC     Status: Abnormal   Collection Time: 12/07/20  4:36 AM  Result Value Ref Range   WBC 8.9 4.0 - 10.5 K/uL   RBC 3.12 (L) 3.87 - 5.11 MIL/uL   Hemoglobin 9.8 (L) 12.0 - 15.0 g/dL   HCT 30.2 (L) 36.0 - 46.0 %   MCV 96.8 80.0 - 100.0 fL   MCH 31.4 26.0 - 34.0 pg   MCHC 32.5 30.0 - 36.0 g/dL   RDW 16.4 (H) 11.5 - 15.5 %    Platelets 204 150 - 400 K/uL   nRBC 0.0 0.0 - 0.2 %    Comment: Performed at [facility], [place] [address], [place], [place] [address]  Magnesium     Status: None   Collection Time: 12/07/20  4:36 AM  Result Value Ref Range   Magnesium 1.9 1.7 - 2.4 mg/dL    Comment: Performed at [facility], [place] [address], [place], [place] [address]    Assessment: Hematemesis one episode after multiple episodes of vomiting.  Likely [employer]ory [employer] tear with no further episodes in the hospital, no melena, no evidence of bleeding. HGB 12 on 07/30/20, 11.2 to 10.5 and today currently at 9.8 which is an expected drop due to patient's IV hydration INR 1.0   Dementia and hard of hearing   Heart failure per Son with EF 25%, no repeat echo.   COVID positive-which is questionable, no symptoms, patient tested positive at nursing home over 3 weeks ago.  Been confirmed today.    Plan Baseline kidney function without elevation of BUN/Cr Slight decrease in HGB however likely a component of dilutional anemia No events overnight. At this time due to advanced age and dementia without any further signs of bleeding we will continue to do conservative measures.   Suggest proton pump to have better at least once daily outpatient. If any reoccurrence of hematemesis or worsening anemia will gladly perform endoscopy in the future, however at this time [facility] gastroenterology will sign off.Vladimir Crofts PA-C 12/07/2020, 10:24 AM  Contact #  [phone]

## 2020-12-07 NOTE — Plan of Care (Signed)
  Problem: Safety: Goal: Non-violent Restraint(s) Outcome: Progressing   Problem: Education: Goal: Knowledge of risk factors and measures for prevention of condition will improve Outcome: Progressing

## 2020-12-08 LAB — CBC
HCT: 32.9 % — ABNORMAL LOW (ref 36.0–46.0)
Hemoglobin: 10.4 g/dL — ABNORMAL LOW (ref 12.0–15.0)
MCH: 31.1 pg (ref 26.0–34.0)
MCHC: 31.6 g/dL (ref 30.0–36.0)
MCV: 98.5 fL (ref 80.0–100.0)
Platelets: 210 10*3/uL (ref 150–400)
RBC: 3.34 MIL/uL — ABNORMAL LOW (ref 3.87–5.11)
RDW: 16.5 % — ABNORMAL HIGH (ref 11.5–15.5)
WBC: 7.6 10*3/uL (ref 4.0–10.5)
nRBC: 0 % (ref 0.0–0.2)

## 2020-12-08 MED ORDER — AMOXICILLIN 500 MG PO CAPS: 500.0000 mg | ORAL_CAPSULE | Freq: Two times a day (BID) | ORAL | 0 refills | Status: AC

## 2020-12-08 NOTE — Progress Notes (Signed)
Hand off Report was given to [place] at [facility]. Will continue monitor the pt.

## 2020-12-08 NOTE — TOC Transition Note (Signed)
Transition of Care [facility]) - CM/SW Discharge Note   Patient Details  Name: [name] MRN: [id number] Date of Birth: Mar 05, 1928  [facility] CM/SW Contact:  Trish Mage, LCSW Phone Number: 12/08/2020, 11:18 AM   Clinical Narrative:   Patient who is stable for discharge will return to [facility].  Family informed.  PTAR arranged.  Nursing, please call report to Ms Varney Biles at [phone]. TOC sign off.     Final next level of care: Assisted Living Barriers to Discharge: Barriers Resolved   Patient Goals and CMS Choice        Discharge Placement                       Discharge Plan and Services                                     Social Determinants of Health (SDOH) Interventions     Readmission Risk Interventions No flowsheet data found.

## 2020-12-08 NOTE — NC FL2 (Signed)
[place] LEVEL OF CARE SCREENING TOOL     IDENTIFICATION  Patient Name: Tanya Koch Birthdate: 01/14/1929 Sex: female Admission Date (Current Location): 12/05/2020  [facility] [place] Number:  Herbalist and Address:  [facility],  [place] [address] [place]      Provider Number: [id number]  Attending Physician Name and Address:  Flora Lipps, MD  Relative Name and Phone Number:  Novie Pyon, [phone]    Current Level of Care: Hospital Recommended Level of Care: [place], Memory Care Prior Approval Number:    Date Approved/Denied:   PASRR Number:    Discharge Plan: Domiciliary (Rest home) ([facility])    Current Diagnoses: Patient Active Problem List   Diagnosis Date Noted   COVID 12/06/2020   Hematemesis 12/05/2020   Dementia without behavioral disturbance ([place]) 12/05/2020   GERD (gastroesophageal reflux disease) 12/05/2020    Orientation RESPIRATION BLADDER Height & Weight     Self  Normal Incontinent Weight: 67.1 kg Height:  '5\' 2"'$  (157.5 cm)  BEHAVIORAL SYMPTOMS/MOOD NEUROLOGICAL BOWEL NUTRITION STATUS      Continent Diet (Mechanical Soft, Thin liquid)  AMBULATORY STATUS COMMUNICATION OF NEEDS Skin   Extensive Assist Verbally Normal                       Personal Care Assistance Level of Assistance  Bathing, Feeding, Dressing Bathing Assistance: Maximum assistance Feeding assistance: Limited assistance Dressing Assistance: Limited assistance     Functional Limitations Info  Sight, Hearing, Speech Sight Info: Adequate Hearing Info: Impaired Speech Info: Adequate    SPECIAL CARE FACTORS FREQUENCY                       Contractures Contractures Info: Not present    Additional Factors Info  Code Status Code Status Info: DNR              Discharge Medications: ALPRAZolam 0.25 MG tablet Commonly known as: XANAX Take 0.25 mg by mouth See admin  instructions. 0.'25mg'$  oral twice daily  And 0.'25mg'$  extra oral twice daily as needed for anxiety           amLODipine 5 MG tablet Commonly known as: NORVASC Take 5 mg by mouth daily.         Icon medications to change how you take   amoxicillin 500 MG capsule Commonly known as: AMOXIL Take 1 capsule (500 mg total) by mouth 2 (two) times daily for 2 days. What changed: when to take this          Cranberry 450 MG Tabs Take 450 mg by mouth 2 (two) times daily.          escitalopram 10 MG tablet Commonly known as: LEXAPRO Take 10 mg by mouth daily.          furosemide 20 MG tablet Commonly known as: LASIX Take 20 mg by mouth daily.          magnesium oxide 400 (240 Mg) MG tablet Commonly known as: MAG-OX Take 400 mg by mouth at bedtime.          Multivitamin Tabs Take 1 tablet by mouth daily.          ondansetron 4 MG tablet Commonly known as: ZOFRAN Take 1 tablet (4 mg total) by mouth every 8 (eight) hours as needed for nausea or vomiting.          pantoprazole  40 MG tablet Commonly known as: PROTONIX Take 40 mg by mouth daily.          rosuvastatin 10 MG tablet Commonly known as: CRESTOR Take 10 mg by mouth at bedtime.           Relevant Imaging Results:  Relevant Lab Results:   Additional [place], LCSW

## 2020-12-08 NOTE — Discharge Summary (Signed)
Physician Discharge Summary  Tanya Koch [id number] DOB: September 01, 1928 DOA: 12/05/2020  PCP: Earmon Phoenix, NP  Admit date: 12/05/2020 Discharge date: 12/08/2020  Admitted From: ALF  Discharge disposition: ALF   Recommendations for Outpatient Follow-Up:   Follow up with your primary care provider in one week.  Check CBC, BMP, magnesium in the next visit  Discharge Diagnosis:   Active Problems:   Hematemesis   Dementia without behavioral disturbance (HCC)   GERD (gastroesophageal reflux disease)   COVID  Discharge Condition: Improved.  Diet recommendation: Dysphagia 3 diet.    Wound care: None.  Code status: Full.   History of Present Illness:   Tanya Koch is a 87 y.o. female with medical history significant for dementia and GERD from assisted living facility presented to hospital with 1 episode of hematemesis after having several episodes of emesis thought to be secondary to antibiotic that she was taking for dental infection.  In the ED patient had stable vitals.  Hemoglobin was 11.0.  Creatinine was elevated at 2.4.  Abdominal x-ray showed the mild costophrenic angle scaring.  In the ED, patient had a 2 episodes of vomiting.  GI was consulted and patient was admitted hospital for further evaluation and treatment.   Hospital Course:   Following conditions were addressed during hospitalization as listed below,  Hematemesis likely secondary to Mallory-Weiss tear. No further hematemesis.  Latest hemoglobin of 10.4.  Had one episode.  GI has seen the patient.  Due to advanced age conservative management with Protonix has been advised..  Continue Protonix on discharge.  Mild anemia chronic Latest hemoglobin at 10.4.  Has remained stable.   Hypokalemia. Resolved.  chronic kidney disease stage IV. Latest creatinine 2.2  after hydration.  Baseline around 2.4.   Asymptomatic COVID-19 infection.  No indication for treatment at this time.  GERD: Continue  Protonix on discharge.  Dementia Stable at this time.  Hard of hearing but communicative.  Dental caries/Abscess: On amoxicillin from outpatient.  Continue to complete the course in 2 days.  hypertension Continue amlodipine on discharge  Disposition.  At this time, patient is stable for disposition to his assisted living facility.  Spoke with the patient's son about disposition   Medical Consultants:   GI  Procedures:    None Subjective:   Today, patient was seen and examined at bedside.  Hard of hearing.  Denies any vomiting hematemesis.  Feels tired.  Discharge Exam:   Vitals:   12/08/20 0617 12/08/20 1015  BP: 119/69 (!) 123/52  Pulse: (!) 48   Resp: 16   Temp: (!) 97.4 F (36.3 C)   SpO2: 97%    Vitals:   12/07/20 1203 12/07/20 1919 12/08/20 0617 12/08/20 1015  BP: (!) 174/64 (!) 155/66 119/69 (!) 123/52  Pulse: (!) 56 (!) 59 (!) 48   Resp: 18 (!) 22 16   Temp: (!) 97.5 F (36.4 C) (!) 97.5 F (36.4 C) (!) 97.4 F (36.3 C)   TempSrc: Oral Oral Axillary   SpO2: 100% 96% 97%   Weight:      Height:       General: Alert awake, not in obvious distress, hard of hearing HENT: pupils equally reacting to light,  No scleral pallor or icterus noted. Oral mucosa is moist.  Chest:  Clear breath sounds.  Diminished breath sounds bilaterally. No crackles or wheezes.  CVS: S1 &S2 heard. No murmur.  Regular rate and rhythm. Abdomen: Soft, nontender, nondistended.  Bowel sounds are heard.   Extremities:  No cyanosis, clubbing or edema.  Peripheral pulses are palpable. Psych: Alert, awake and oriented, normal mood CNS:  No cranial nerve deficits.  Power equal in all extremities.   Skin: Warm and dry.  No rashes noted.  The results of significant diagnostics from this hospitalization (including imaging, microbiology, ancillary and laboratory) are listed below for reference.     Diagnostic Studies:   DG Abdomen Acute W/Chest  Result Date: 12/05/2020 CLINICAL DATA:   Hematemesis, decreased appetite EXAM: DG ABDOMEN ACUTE WITH 1 VIEW CHEST COMPARISON:  None. FINDINGS: Top-normal heart size. Mildly tortuous atherosclerotic thoracic aorta. Otherwise normal mediastinal contour. No pneumothorax. No pleural effusion. Mild linear left costophrenic angle scarring versus atelectasis. No pulmonary edema. No consolidative airspace disease. No dilated small bowel loops or air-fluid levels. Mild colonic stool. No evidence of pneumatosis or pneumoperitoneum. Mild levocurvature of the lumbar spine with associated degenerative changes. No radiopaque nephrolithiasis. IMPRESSION: 1. Mild left costophrenic angle scarring versus atelectasis. Otherwise no active cardiopulmonary disease. 2. Nonobstructive bowel gas pattern. Electronically Signed   By: Ilona Sorrel M.D.   On: 12/05/2020 15:06     Labs:   Basic Metabolic Panel: Recent Labs  Lab 12/04/20 2108 12/05/20 1408 12/06/20 0319 12/07/20 0436  NA 139 139 140 140  K 3.4* 3.5 3.2* 3.6  CL 98 102 104 105  CO2 '30 28 28 26  '$ GLUCOSE 114* 130* 93 87  BUN 24* '22 22 20  '$ CREATININE 2.55* 2.42* 2.53* 2.26*  CALCIUM 9.1 8.6* 8.5* 8.6*  MG  --  2.0  --  1.9   GFR Estimated Creatinine Clearance: 14.9 mL/min (A) (by C-G formula based on SCr of 2.26 mg/dL (H)). Liver Function Tests: Recent Labs  Lab 12/04/20 2108 12/05/20 1408  AST 14* 17  ALT 6 9  ALKPHOS 45 44  BILITOT 0.4 0.7  PROT 6.9 6.8  ALBUMIN 3.4* 3.2*   Recent Labs  Lab 12/04/20 2108 12/05/20 1408  LIPASE 36 40   No results for input(s): AMMONIA in the last 168 hours. Coagulation profile Recent Labs  Lab 12/05/20 1408  INR 1.0    CBC: Recent Labs  Lab 12/04/20 2108 12/05/20 1408 12/06/20 0319 12/07/20 0436 12/08/20 0454  WBC 12.3* 10.6* 9.2 8.9 7.6  NEUTROABS 8.9* 7.9*  --   --   --   HGB 11.2* 11.0* 10.5* 9.8* 10.4*  HCT 34.3* 35.1* 32.7* 30.2* 32.9*  MCV 94.2 97.5 95.1 96.8 98.5  PLT 242 234 214 204 210   Cardiac Enzymes: No results  for input(s): CKTOTAL, CKMB, CKMBINDEX, TROPONINI in the last 168 hours. BNP: Invalid input(s): POCBNP CBG: No results for input(s): GLUCAP in the last 168 hours. D-Dimer No results for input(s): DDIMER in the last 72 hours. Hgb A1c No results for input(s): HGBA1C in the last 72 hours. Lipid Profile No results for input(s): CHOL, HDL, LDLCALC, TRIG, CHOLHDL, LDLDIRECT in the last 72 hours. Thyroid function studies No results for input(s): TSH, T4TOTAL, T3FREE, THYROIDAB in the last 72 hours.  Invalid input(s): FREET3 Anemia work up No results for input(s): VITAMINB12, FOLATE, FERRITIN, TIBC, IRON, RETICCTPCT in the last 72 hours. Microbiology Recent Results (from the past 240 hour(s))  Resp Panel by RT-PCR (Flu A&B, Covid) Nasopharyngeal Swab     Status: Abnormal   Collection Time: 12/05/20  3:33 PM   Specimen: Nasopharyngeal Swab; Nasopharyngeal(NP) swabs in vial transport medium  Result Value Ref Range Status   SARS Coronavirus 2 by RT PCR POSITIVE (A) NEGATIVE Final  Comment: CRITICAL RESULT CALLED TO, READ BACK BY AND VERIFIED WITH:  KISER,C RN ON 12/05/20 @ [facility] (NOTE) SARS-CoV-2 target nucleic acids are DETECTED.  The SARS-CoV-2 RNA is generally detectable in upper respiratory specimens during the acute phase of infection. Positive results are indicative of the presence of the identified virus, but do not rule out bacterial infection or co-infection with other pathogens not detected by the test. Clinical correlation with patient history and other diagnostic information is necessary to determine patient infection status. The expected result is Negative.  Fact Sheet for Patients: EntrepreneurPulse.com.au  Fact Sheet for Healthcare Providers: IncredibleEmployment.be  This test is not yet approved or cleared by the [place] FDA and  has been authorized for detection and/or diagnosis of SARS-CoV-2 by FDA under an  Emergency Use Authorization (EUA).  This EUA will remain in effect (meaning  this test can be used) for the duration of  the COVID-19 declaration under Section 564(b)(1) of the Act, 21 U.S.C. section 360bbb-3(b)(1), unless the authorization is terminated or revoked sooner.     Influenza A by PCR NEGATIVE NEGATIVE Final   Influenza B by PCR NEGATIVE NEGATIVE Final    Comment: (NOTE) The Xpert Xpress SARS-CoV-2/FLU/RSV plus assay is intended as an aid in the diagnosis of influenza from Nasopharyngeal swab specimens and should not be used as a sole basis for treatment. Nasal washings and aspirates are unacceptable for Xpert Xpress SARS-CoV-2/FLU/RSV testing.  Fact Sheet for Patients: EntrepreneurPulse.com.au  Fact Sheet for Healthcare Providers: IncredibleEmployment.be  This test is not yet approved or cleared by the [place] FDA and has been authorized for detection and/or diagnosis of SARS-CoV-2 by FDA under an Emergency Use Authorization (EUA). This EUA will remain in effect (meaning this test can be used) for the duration of the COVID-19 declaration under Section 564(b)(1) of the Act, 21 U.S.C. section 360bbb-3(b)(1), unless the authorization is terminated or revoked.  Performed at [facility], [place] [address], [place], [place] [address]      Discharge Instructions:   Discharge Instructions     Diet general   Complete by: As directed    Dysphagia 3 diet.   Discharge instructions   Complete by: As directed    Follow-up with your primary care physician in 1 week.  Continue Protonix every day.  Seek medical attention for worsening symptoms.   Increase activity slowly   Complete by: As directed       Allergies as of 12/08/2020   No Known Allergies      Medication List     TAKE these medications    ALPRAZolam 0.25 MG tablet Commonly known as: XANAX Take 0.25 mg by mouth See admin instructions. 0.'25mg'$   oral twice daily And 0.'25mg'$  extra oral twice daily as needed for anxiety   amLODipine 5 MG tablet Commonly known as: NORVASC Take 5 mg by mouth daily.   amoxicillin 500 MG capsule Commonly known as: AMOXIL Take 1 capsule (500 mg total) by mouth 2 (two) times daily for 2 days. What changed: when to take this   Cranberry 450 MG Tabs Take 450 mg by mouth 2 (two) times daily.   escitalopram 10 MG tablet Commonly known as: LEXAPRO Take 10 mg by mouth daily.   furosemide 20 MG tablet Commonly known as: LASIX Take 20 mg by mouth daily.   magnesium oxide 400 (240 Mg) MG tablet Commonly known as: MAG-OX Take 400 mg by mouth at bedtime.   Multivitamin Tabs Take 1 tablet  by mouth daily.   ondansetron 4 MG tablet Commonly known as: ZOFRAN Take 1 tablet (4 mg total) by mouth every 8 (eight) hours as needed for nausea or vomiting.   pantoprazole 40 MG tablet Commonly known as: PROTONIX Take 40 mg by mouth daily.   rosuvastatin 10 MG tablet Commonly known as: CRESTOR Take 10 mg by mouth at bedtime.        Follow-up Information     Earmon Phoenix, NP Follow up in 1 week(s).   Specialty: Gerontology Contact information: [place] [address] [phone]                  Time coordinating discharge: 39 minutes  Signed:  Rawlin Reaume  [facility] 12/08/2020, 10:25 AM

## 2021-01-18 ENCOUNTER — Telehealth: Payer: Self-pay

## 2021-01-18 NOTE — Telephone Encounter (Signed)
NOTES SCANNED TO REFERRAL 

## 2021-02-02 ENCOUNTER — Emergency Department (HOSPITAL_COMMUNITY): Payer: Medicare Other

## 2021-02-02 ENCOUNTER — Emergency Department (HOSPITAL_COMMUNITY)
Admission: EM | Admit: 2021-02-02 | Discharge: 2021-02-02 | Disposition: A | Discharge status: Home / Self Care | Payer: Medicare Other | Attending: Emergency Medicine | Admitting: Emergency Medicine

## 2021-02-02 ENCOUNTER — Encounter (HOSPITAL_COMMUNITY): Payer: Self-pay

## 2021-02-02 DIAGNOSIS — S0083XA Contusion of other part of head, initial encounter: Secondary | ICD-10-CM | POA: Diagnosis not present

## 2021-02-02 DIAGNOSIS — W19XXXA Unspecified fall, initial encounter: Secondary | ICD-10-CM | POA: Insufficient documentation

## 2021-02-02 DIAGNOSIS — Z79899 Other long term (current) drug therapy: Secondary | ICD-10-CM | POA: Diagnosis not present

## 2021-02-02 DIAGNOSIS — S0990XA Unspecified injury of head, initial encounter: Secondary | ICD-10-CM | POA: Diagnosis present

## 2021-02-02 DIAGNOSIS — Z8616 Personal history of COVID-19: Secondary | ICD-10-CM | POA: Insufficient documentation

## 2021-02-02 DIAGNOSIS — F0390 Unspecified dementia without behavioral disturbance: Secondary | ICD-10-CM | POA: Diagnosis not present

## 2021-02-02 MED ORDER — AMLODIPINE BESYLATE 5 MG PO TABS
5.0000 mg | ORAL_TABLET | Freq: Once | ORAL | Status: AC
  Administered 2021-02-02: 5 mg via ORAL
  Filled 2021-02-02: qty 1

## 2021-02-02 NOTE — ED Triage Notes (Signed)
Pt arrived via EMS, from [facility], unwitnessed fall this morning. States mechanical fall.  No blood thinners, no head strike. Staff was able to help her up, she stated she felt fine, approx 1 hr after started with right leg pain. Normally able to stand and pivot, now unable to. No visible deformity

## 2021-02-02 NOTE — ED Notes (Signed)
Pt. Repositioned and brief changed.

## 2021-02-02 NOTE — ED Provider Notes (Addendum)
[facility] DEPT Provider Note   CSN: [id number] Arrival date & time: 02/02/21  1029     History No chief complaint on file.   Tanya Koch is a 87 y.o. female.  Patient brought in from [facility].  Patient is a DNR.  There was an unwitnessed fall this morning.  Patient denies striking her head but she does have a bruise to her left face area.  Apparently when she first got up she was fine and then started to complain of leg pain.  Patient now denying any kind of pain.  No obvious lower extremity deformities.  No pain with range of motion of the lower extremities.      History reviewed. No pertinent past medical history.  Patient Active Problem List   Diagnosis Date Noted   COVID 12/06/2020   Hematemesis 12/05/2020   Dementia without behavioral disturbance ([place]) 12/05/2020   GERD (gastroesophageal reflux disease) 12/05/2020    History reviewed. No pertinent surgical history.   OB History   No obstetric history on file.     History reviewed. No pertinent family history.     Home Medications Prior to Admission medications   Medication Sig Start Date End Date Taking? Authorizing Provider  ALPRAZolam (XANAX) 0.25 MG tablet Take 0.25 mg by mouth See admin instructions. 0.25mg  oral twice daily And 0.25mg  extra oral twice daily as needed for anxiety 07/15/20   [provider]  amLODipine (NORVASC) 5 MG tablet Take 5 mg by mouth daily. 06/21/20   [provider]  Cranberry 450 MG TABS Take 450 mg by mouth 2 (two) times daily.    [provider]  escitalopram (LEXAPRO) 10 MG tablet Take 10 mg by mouth daily. 07/15/20   [provider]  furosemide (LASIX) 20 MG tablet Take 20 mg by mouth daily. 07/15/20   [provider]  magnesium oxide (MAG-OX) 400 (240 Mg) MG tablet Take 400 mg by mouth at bedtime. 07/30/20   [provider]  Multiple Vitamin (MULTIVITAMIN) TABS Take 1 tablet by mouth  daily. 07/20/20   [provider]  ondansetron (ZOFRAN) 4 MG tablet Take 1 tablet (4 mg total) by mouth every 8 (eight) hours as needed for nausea or vomiting. Patient not taking: Reported on 12/06/2020 12/04/20   Lennice Sites, DO  pantoprazole (PROTONIX) 40 MG tablet Take 40 mg by mouth daily. 07/08/20   [provider]  rosuvastatin (CRESTOR) 10 MG tablet Take 10 mg by mouth at bedtime. 06/20/20   [provider]    Allergies    Patient has no known allergies.  Review of Systems   Review of Systems  Unable to perform ROS: Dementia  Skin:  Negative for color change and rash.  Psychiatric/Behavioral:  Positive for confusion.   All other systems reviewed and are negative.  Physical Exam Updated Vital Signs BP (!) 152/81 (BP Location: Left Arm)   Pulse (!) 46   Temp 98.2 F (36.8 C) (Oral)   Resp 20   SpO2 99%   Physical Exam Vitals and nursing note reviewed.  Constitutional:      General: She is not in acute distress.    Appearance: Normal appearance. She is well-developed.  HENT:     Head: Normocephalic.     Comments: Bruising to the left cheek area. Eyes:     Extraocular Movements: Extraocular movements intact.     Conjunctiva/sclera: Conjunctivae normal.     Pupils: Pupils are equal, round, and reactive to light.  Cardiovascular:     Rate and Rhythm: Normal rate and regular rhythm.     Heart sounds: No murmur heard. Pulmonary:     Effort: Pulmonary effort is normal. No respiratory distress.     Breath sounds: Normal breath sounds.  Abdominal:     Palpations: Abdomen is soft.     Tenderness: There is no abdominal tenderness.  Musculoskeletal:        General: No swelling, tenderness or deformity.     Cervical back: Neck supple.     Comments: Dorsalis pedis pulse lower extremities bilaterally 1+.  Good movement of the toes.  No swelling to the knees.  No pain with range of motion at the ankle or at the knee.  No pain with range of motion at  the hips.  Skin:    General: Skin is warm and dry.     Capillary Refill: Capillary refill takes less than 2 seconds.  Neurological:     Mental Status: She is alert. Mental status is at baseline.     Cranial Nerves: No cranial nerve deficit.     Sensory: No sensory deficit.     Motor: No weakness.  Psychiatric:        Mood and Affect: Mood normal.    ED Results / Procedures / Treatments   Labs (all labs ordered are listed, but only abnormal results are displayed) Labs Reviewed - No data to display  EKG None  Radiology No results found.  Procedures Procedures   Medications Ordered in ED Medications - No data to display  ED Course  I have reviewed the triage vital signs and the nursing notes.  Pertinent labs & imaging results that were available during my care of the patient were reviewed by me and considered in my medical decision making (see chart for details).    MDM Rules/Calculators/A&P                           Facility seem to be concerned that she would not weight-bear on her right leg.  So we will get bilateral hips.  Also get CT head although denies any head trauma.  There is bruising to the left face cheek area.  Clinically I do not think that there is any injury to the femur or tib-fib.  We will add on x-rays of that area for the right leg which was the 1 of concern.  CT head without any acute findings.  X-ray of bilateral hips and pelvis are negative.  X-ray of the right femur right tib-fib and right foot without any bony abnormalities.  Patient stable to return back to nursing facility.  Final Clinical Impression(s) / ED Diagnoses Final diagnoses:  Fall, initial encounter    Rx / DC Orders ED Discharge Orders     None        Fredia Sorrow, MD 02/02/21 1134    Fredia Sorrow, MD 02/02/21 1408

## 2021-02-02 NOTE — ED Notes (Signed)
Report called to [facility] 

## 2021-02-02 NOTE — ED Notes (Signed)
[facility] called for transport.  

## 2021-02-02 NOTE — Discharge Instructions (Signed)
Cup for the fall shows no abnormalities to the right lower extremity.  X-rays of the both hips and pelvis was negative x-ray of the right femur right tib-fib and right foot negative for any bony abnormalities.  Also did CT scan head because there was evidence of bruising in the left face area.  That also was negative for any acute findings.  Patient stable for return back to nursing facility.

## 2021-02-04 NOTE — Progress Notes (Signed)
Cardiology Office Note:    Date:  02/07/2021   ID:  Tanya Koch, DOB Apr 17, 1928, MRN [id number]  PCP:  Earmon Phoenix, NP   [facility] Providers Cardiologist:  Lenna Sciara, MD Referring MD: Earmon Phoenix, NP   Chief Complaint/Reason for Referral:  Bradycardia   ASSESSMENT & PLAN:    Sinus bradycardia  Hypertension, unspecified type  Given advanced age, DNR status, and dementia no further therapy or evaluation is necessary.  The patient should be treated conservatively.  We had a long talk about this with her son and daughter-in-law.  They agree with this strategy.  I will make no medication changes.  I will obtain an echocardiogram as a baseline.  However given her kidney disease and overall status she is not really a candidate for aggressive heart failure therapy.  She is not a candidate for CRT-D.  I will follow-up in 6 months or earlier if needed.    I will make no medication changes at this time.      Dispo:  No follow-ups on file.     Medication Adjustments/Labs and Tests Ordered: Current medicines are reviewed at length with the patient today.  Concerns regarding medicines are outlined above.   Tests Ordered: No orders of the defined types were placed in this encounter.   Medication Changes: No orders of the defined types were placed in this encounter.   History of Present Illness:    FOCUSED CARDIOVASCULAR PROBLEM LIST:   1.  DNR 2.  History of dementia 3.  Nursing home resident 4.  Hypertension 5.  Chronic kidney disease creatinine 2.6 GFR ~15 (Stage 4) 6.  Left bundle branch block  The patient is a 87 y.o. female with the indicated medical history here for Cardiology recommendations.  The patient was hospitalized in [place] in 2019 due to sepsis, rapid atrial fibrillation, GI bleed, and acute kidney injury.  She did develop an elevated troponin but no cardiac catheterization was pursued.  An echocardiogram at that time demonstrated an ejection fraction  of around 20% with moderate to severe mitral regurgitation.  No plans for a pacemaker or defibrillator were pursued due to the patient's advanced age and other comorbidities.  The patient was recently moved to [place] as her son lives here.  She is pleasantly distant demented today.  She and her family however report no significant cardiovascular symptomatology.  They report no chest pain, shortness of breath, Paraschos nocturnal dyspnea, orthopnea.       Previous Medical History: Past Medical History:  Diagnosis Date   Bradycardia    CHF (congestive heart failure) (HCC)    Emphysema, unspecified (HCC)    History of MI (myocardial infarction)    Hypertension      Current Medications: No outpatient medications have been marked as taking for the 02/07/21 encounter (Appointment) with Early Osmond, MD.     Allergies:    Patient has no known allergies.   Social History:       Family Hx: No family history on file.   Review of Systems:   Please see the history of present illness.    All other systems reviewed and are negative.  EKGs/Labs/Other Test Reviewed:    EKG:  EKG today: Sinus rhythm with left bundle branch block and an occasional PVCs; prior EKG: Sinus rhythm with left bundle branch block and PVCs  Prior CV studies: None performed  Imaging studies that I have independently reviewed today: Head CT (negative)  Recent Labs: 12/05/2020: ALT  9 12/07/2020: BUN 20; Creatinine, Ser 2.26; Magnesium 1.9; Potassium 3.6; Sodium 140 12/08/2020: Hemoglobin 10.4; Platelets 210   Recent Lipid Panel No results found for: CHOL, TRIG, HDL, LDLCALC, LDLDIRECT  Risk Assessment/Calculations:          Physical Exam:    VS:  There were no vitals taken for this visit.   Wt Readings from Last 3 Encounters:  12/06/20 147 lb 14.9 oz (67.1 kg)    GENERAL:  No apparent distress, AOx1 HEENT:  No carotid bruits, +2 carotid impulses, no scleral icterus CAR: RRR with  occasional ectopy, gallops, rubs, or thrills RES:  Clear to auscultation bilaterally ABD:  Soft, nontender, nondistended, positive bowel sounds x 4 VASC:  +2 L radial pulse, no R radial pulse; +2 carotid pulses, palpable pedal pulses NEURO:  CN 2-12 grossly intact; motor and sensory grossly intact PSYCH:  No active depression or anxiety EXT:  +1 edema, ecchymosis, or cyanosis  Signed, Early Osmond, MD  02/07/2021 8:15 AM    [address], [place], [place]  [address] Phone: [phone]; Fax: [phone]

## 2021-02-07 ENCOUNTER — Ambulatory Visit: Payer: Medicare Other | Admitting: Internal Medicine

## 2021-02-07 ENCOUNTER — Other Ambulatory Visit: Payer: Self-pay

## 2021-02-07 ENCOUNTER — Encounter: Payer: Self-pay | Admitting: Internal Medicine

## 2021-02-07 VITALS — BP 128/58 | HR 76 | Wt 149.6 lb

## 2021-02-07 DIAGNOSIS — I10 Essential (primary) hypertension: Secondary | ICD-10-CM | POA: Diagnosis not present

## 2021-02-07 DIAGNOSIS — R001 Bradycardia, unspecified: Secondary | ICD-10-CM | POA: Diagnosis not present

## 2021-02-07 NOTE — Patient Instructions (Signed)
Medication Instructions:  No changes today *If you need a refill on your cardiac medications before your next appointment, please call your pharmacy*   Lab Work: none If you have labs (blood work) drawn today and your tests are completely normal, you will receive your results only by: [place] (if you have MyChart) OR A paper copy in the mail If you have any lab test that is abnormal or we need to change your treatment, we will call you to review the results.   Testing/Procedures: Your physician has requested that you have an echocardiogram. Echocardiography is a painless test that uses sound waves to create images of your heart. It provides your doctor with information about the size and shape of your heart and how well your heart's chambers and valves are working. This procedure takes approximately one hour. There are no restrictions for this procedure.   Follow-Up: At [facility], you and your health needs are our priority.  As part of our continuing mission to provide you with exceptional heart care, we have created designated Provider Care Teams.  These Care Teams include your primary Cardiologist (physician) and Advanced Practice Providers (APPs -  Physician Assistants and Nurse Practitioners) who all work together to provide you with the care you need, when you need it.   Your next appointment:   6 month(s)  The format for your next appointment:   In Person  Provider:   Advanced Practice Providers: Melina Copa, PA-C,  Ermalinda Barrios, PA-C, Cecilie Kicks, NP    Other Instructions

## 2021-02-08 ENCOUNTER — Ambulatory Visit: Payer: Medicare Other | Admitting: Internal Medicine

## 2021-02-10 ENCOUNTER — Emergency Department (HOSPITAL_BASED_OUTPATIENT_CLINIC_OR_DEPARTMENT_OTHER): Payer: Medicare Other

## 2021-02-10 ENCOUNTER — Emergency Department (HOSPITAL_BASED_OUTPATIENT_CLINIC_OR_DEPARTMENT_OTHER): Payer: Medicare Other | Admitting: Radiology

## 2021-02-10 ENCOUNTER — Emergency Department (HOSPITAL_BASED_OUTPATIENT_CLINIC_OR_DEPARTMENT_OTHER)
Admission: EM | Admit: 2021-02-10 | Discharge: 2021-02-10 | Disposition: A | Discharge status: Home / Self Care | Payer: Medicare Other | Attending: Emergency Medicine | Admitting: Emergency Medicine

## 2021-02-10 ENCOUNTER — Encounter (HOSPITAL_BASED_OUTPATIENT_CLINIC_OR_DEPARTMENT_OTHER): Payer: Self-pay

## 2021-02-10 ENCOUNTER — Other Ambulatory Visit: Payer: Self-pay

## 2021-02-10 DIAGNOSIS — S79911A Unspecified injury of right hip, initial encounter: Secondary | ICD-10-CM | POA: Diagnosis present

## 2021-02-10 DIAGNOSIS — Z79899 Other long term (current) drug therapy: Secondary | ICD-10-CM | POA: Insufficient documentation

## 2021-02-10 DIAGNOSIS — S51812A Laceration without foreign body of left forearm, initial encounter: Secondary | ICD-10-CM | POA: Insufficient documentation

## 2021-02-10 DIAGNOSIS — W19XXXA Unspecified fall, initial encounter: Secondary | ICD-10-CM | POA: Diagnosis not present

## 2021-02-10 DIAGNOSIS — I110 Hypertensive heart disease with heart failure: Secondary | ICD-10-CM | POA: Diagnosis not present

## 2021-02-10 DIAGNOSIS — Z20822 Contact with and (suspected) exposure to covid-19: Secondary | ICD-10-CM | POA: Insufficient documentation

## 2021-02-10 DIAGNOSIS — S72001S Fracture of unspecified part of neck of right femur, sequela: Secondary | ICD-10-CM

## 2021-02-10 DIAGNOSIS — Z87891 Personal history of nicotine dependence: Secondary | ICD-10-CM | POA: Diagnosis not present

## 2021-02-10 DIAGNOSIS — Z8616 Personal history of COVID-19: Secondary | ICD-10-CM | POA: Insufficient documentation

## 2021-02-10 DIAGNOSIS — F0390 Unspecified dementia without behavioral disturbance: Secondary | ICD-10-CM | POA: Insufficient documentation

## 2021-02-10 DIAGNOSIS — I509 Heart failure, unspecified: Secondary | ICD-10-CM | POA: Insufficient documentation

## 2021-02-10 DIAGNOSIS — S72111A Displaced fracture of greater trochanter of right femur, initial encounter for closed fracture: Secondary | ICD-10-CM | POA: Diagnosis not present

## 2021-02-10 HISTORY — DX: Fracture of unspecified part of neck of right femur, sequela: S72.001S

## 2021-02-10 LAB — BASIC METABOLIC PANEL
Anion gap: 9 (ref 5–15)
BUN: 26 mg/dL — ABNORMAL HIGH (ref 8–23)
CO2: 29 mmol/L (ref 22–32)
Calcium: 8.6 mg/dL — ABNORMAL LOW (ref 8.9–10.3)
Chloride: 104 mmol/L (ref 98–111)
Creatinine, Ser: 1.59 mg/dL — ABNORMAL HIGH (ref 0.44–1.00)
GFR, Estimated: 31 mL/min — ABNORMAL LOW (ref >60)
Glucose, Bld: 102 mg/dL — ABNORMAL HIGH (ref 70–99)
Potassium: 3.5 mmol/L (ref 3.5–5.1)
Sodium: 142 mmol/L (ref 135–145)

## 2021-02-10 LAB — CBC
HCT: 34.9 % — ABNORMAL LOW (ref 36.0–46.0)
Hemoglobin: 11.4 g/dL — ABNORMAL LOW (ref 12.0–15.0)
MCH: 31.8 pg (ref 26.0–34.0)
MCHC: 32.7 g/dL (ref 30.0–36.0)
MCV: 97.2 fL (ref 80.0–100.0)
Platelets: 319 10*3/uL (ref 150–400)
RBC: 3.59 MIL/uL — ABNORMAL LOW (ref 3.87–5.11)
RDW: 18.4 % — ABNORMAL HIGH (ref 11.5–15.5)
WBC: 12.3 10*3/uL — ABNORMAL HIGH (ref 4.0–10.5)
nRBC: 0 % (ref 0.0–0.2)

## 2021-02-10 LAB — CK: Total CK: 123 U/L (ref 38–234)

## 2021-02-10 LAB — RESP PANEL BY RT-PCR (FLU A&B, COVID) ARPGX2
Influenza A by PCR: NEGATIVE
Influenza B by PCR: NEGATIVE
SARS Coronavirus 2 by RT PCR: NEGATIVE

## 2021-02-10 MED ORDER — ACETAMINOPHEN 325 MG PO TABS
650.0000 mg | ORAL_TABLET | Freq: Once | ORAL | Status: AC
  Administered 2021-02-10: 650 mg via ORAL
  Filled 2021-02-10: qty 2

## 2021-02-10 NOTE — ED Provider Notes (Signed)
Patient signed to me by Dr. Pearline Cables pending results of CK which were negative.  Will discharged back to facility   Lacretia Leigh, MD 02/10/21 1719

## 2021-02-10 NOTE — ED Provider Notes (Signed)
[facility] EMERGENCY DEPT Provider Note   CSN: [id number] Arrival date & time: 02/10/21  1115     History Chief Complaint  Patient presents with   Tanya Koch is a 87 y.o. female.  This is a 87 y.o. female with significant medical history as below, including dementia, recurrent falls who presents to the ED with complaint of possible fall.  Patient unable to provide history secondary to underlying dementia.  She does report that she is "feeling fine."  And that her husband said that she needed to come to the hospital to be checked out.  She has some mild pain to her right hip that she feels is chronic and does not interfere with ambulation.  No headache, no obvious wounds per the patient.     Level 5 caveat, dementia  Discussed with family Socarras,Tom Son @ [phone], reports patient is not on blood thinners, she has frequent falls.  He was not notified by nursing staff the patient had a fall.    The history is provided by a relative. No language interpreter was used.  Fall      Past Medical History:  Diagnosis Date   Bradycardia    CHF (congestive heart failure) (HCC)    Emphysema, unspecified (HCC)    History of MI (myocardial infarction)    Hypertension     Patient Active Problem List   Diagnosis Date Noted   COVID 12/06/2020   Hematemesis 12/05/2020   Dementia without behavioral disturbance ([place]) 12/05/2020   GERD (gastroesophageal reflux disease) 12/05/2020    History reviewed. No pertinent surgical history.   OB History   No obstetric history on file.     No family history on file.  Social History   Tobacco Use   Smoking status: Former    Years: 50.00    Types: Cigarettes    Passive exposure: Never   Smokeless tobacco: Never  Substance Use Topics   Alcohol use: Not Currently   Drug use: Never    Home Medications Prior to Admission medications   Medication Sig Start Date End Date Taking? Authorizing Provider   ALPRAZolam (XANAX) 0.25 MG tablet Take 0.25 mg by mouth 2 (two) times daily as needed. 07/15/20  Yes [provider]  amLODipine (NORVASC) 5 MG tablet Take 5 mg by mouth daily. 06/21/20  Yes [provider]  Cranberry 450 MG TABS Take 450 mg by mouth 2 (two) times daily.   Yes [provider]  escitalopram (LEXAPRO) 10 MG tablet Take 10 mg by mouth daily. 07/15/20  Yes [provider]  furosemide (LASIX) 20 MG tablet Take 20 mg by mouth daily. 07/15/20  Yes [provider]  magnesium oxide (MAG-OX) 400 (240 Mg) MG tablet Take 400 mg by mouth at bedtime. 07/30/20  Yes [provider]  Multiple Vitamin (MULTIVITAMIN) TABS Take 1 tablet by mouth daily. 07/20/20  Yes [provider]  pantoprazole (PROTONIX) 40 MG tablet Take 40 mg by mouth daily. 07/08/20  Yes [provider]  rosuvastatin (CRESTOR) 10 MG tablet Take 10 mg by mouth at bedtime. 06/20/20  Yes [provider]  acetaminophen (TYLENOL) 325 MG tablet Take 650 mg by mouth every 6 (six) hours as needed for moderate pain.    [provider]  ondansetron (ZOFRAN) 4 MG tablet Take 1 tablet (4 mg total) by mouth every 8 (eight) hours as needed for nausea or vomiting. 12/04/20   Curatolo, Adam, DO  traMADol (ULTRAM) 50 MG  tablet Take 50 mg by mouth every 6 (six) hours as needed.    [provider]    Allergies    Patient has no known allergies.  Review of Systems   Review of Systems  Unable to perform ROS: Dementia   Physical Exam Updated Vital Signs BP (!) 148/136    Pulse 60    Temp (!) 96.6 F (35.9 C) (Temporal)    Resp 20    Wt 68.5 kg    SpO2 98%    BMI 27.62 kg/m   Physical Exam Vitals and nursing note reviewed.  Constitutional:      General: She is not in acute distress.    Appearance: Normal appearance.  HENT:     Head: Normocephalic and atraumatic.     Right Ear: External ear normal.     Left Ear: External ear normal.     Nose:  Nose normal.     Mouth/Throat:     Mouth: Mucous membranes are moist.  Eyes:     General: No scleral icterus.       Right eye: No discharge.        Left eye: No discharge.  Cardiovascular:     Rate and Rhythm: Normal rate and regular rhythm.     Pulses: Normal pulses.     Heart sounds: Normal heart sounds.  Pulmonary:     Effort: Pulmonary effort is normal. No respiratory distress.     Breath sounds: Normal breath sounds.  Abdominal:     General: Abdomen is flat.     Tenderness: There is no abdominal tenderness.  Musculoskeletal:        General: Normal range of motion.     Cervical back: Normal range of motion.     Right lower leg: No edema.     Left lower leg: No edema.       Legs:     Comments: Lower extremities neurovascular intact bilateral.  Mild reduced range of motion to right lower extremity secondary to discomfort.  No midline spinous process palpation or percussion, no crepitus or step-off.  Rectal tone intact.  Skin:    General: Skin is warm and dry.     Capillary Refill: Capillary refill takes less than 2 seconds.       Neurological:     Mental Status: She is alert.  Psychiatric:        Mood and Affect: Mood normal.        Behavior: Behavior normal.    ED Results / Procedures / Treatments   Labs (all labs ordered are listed, but only abnormal results are displayed) Labs Reviewed  CBC - Abnormal; Notable for the following components:      Result Value   WBC 12.3 (*)    RBC 3.59 (*)    Hemoglobin 11.4 (*)    HCT 34.9 (*)    RDW 18.4 (*)    All other components within normal limits  BASIC METABOLIC PANEL - Abnormal; Notable for the following components:   Glucose, Bld 102 (*)    BUN 26 (*)    Creatinine, Ser 1.59 (*)    Calcium 8.6 (*)    GFR, Estimated 31 (*)    All other components within normal limits  RESP PANEL BY RT-PCR (FLU A&B, COVID) ARPGX2  CK    EKG None  Radiology DG Chest 1 View  Result Date: 02/10/2021 CLINICAL DATA:  Found  on floor EXAM: CHEST  1 VIEW COMPARISON:  None FINDINGS:  Transverse diameter of heart is slightly increased. There are no signs of pulmonary edema or focal pulmonary consolidation. There is no significant pleural effusion or pneumothorax. IMPRESSION: No active disease is seen in the chest. Electronically Signed   By: Elmer Picker M.D.   On: 02/10/2021 12:30   CT Head Wo Contrast  Result Date: 02/10/2021 CLINICAL DATA:  Fall EXAM: CT HEAD WITHOUT CONTRAST TECHNIQUE: Contiguous axial images were obtained from the base of the skull through the vertex without intravenous contrast. COMPARISON:  02/02/2021 FINDINGS: Brain: There is no acute intracranial hemorrhage, mass effect, or edema. Asharia Lotter-white differentiation is preserved. There is no extra-axial fluid collection. Ventricles and sulci are stable in size and configuration. Stable findings of probable chronic microvascular ischemic changes in the cerebral white matter. Vascular: There is atherosclerotic calcification at the skull base. Skull: Calvarium is unremarkable. Sinuses/Orbits: No acute finding. Other: None. IMPRESSION: No evidence of acute intracranial injury. Electronically Signed   By: Macy Mis M.D.   On: 02/10/2021 12:25   CT Cervical Spine Wo Contrast  Result Date: 02/10/2021 CLINICAL DATA:  Fall EXAM: CT CERVICAL SPINE WITHOUT CONTRAST TECHNIQUE: Multidetector CT imaging of the cervical spine was performed without intravenous contrast. Multiplanar CT image reconstructions were also generated. COMPARISON:  None. FINDINGS: Alignment: No significant listhesis. Skull base and vertebrae: Vertebral body heights are maintained. No acute fracture. Soft tissues and spinal canal: No prevertebral fluid or swelling. No visible canal hematoma. Disc levels: Multilevel degenerative changes are present including disc space narrowing, endplate osteophytes, and facet and uncovertebral hypertrophy. Upper chest: No apical lung mass. Other:  Retropharyngeal course of the carotids. Calcified plaque at the right greater than left common carotid bifurcations. IMPRESSION: No acute cervical spine fracture. Electronically Signed   By: Macy Mis M.D.   On: 02/10/2021 12:29   CT FEMUR RIGHT WO CONTRAST  Result Date: 02/10/2021 CLINICAL DATA:  Comminuted displaced fracture of the greater trochanter EXAM: CT OF THE LOWER RIGHT EXTREMITY WITHOUT CONTRAST TECHNIQUE: Multidetector CT imaging of the right lower extremity was performed according to the standard protocol. COMPARISON:  None. FINDINGS: Bones/Joint/Cartilage There is a mildly displaced comminuted fracture of the superior aspect of the greater trochanter. There is no cortical irregularity of the femoral neck or CT evidence of extension into the intertrochanteric region. Ligaments Suboptimally assessed by CT. Muscles and Tendons Soft tissue swelling and edema about the greater tuberosity. No drainable fluid collection or hematoma. Soft tissues Mild subcutaneous fat tissue stranding about the lateral aspect of the right hip. No fluid collection or abscess. IMPRESSION: 1. Mildly displaced comminuted fracture of the superior aspect of the greater trochanter. No CT evidence of extension into the femoral neck or intertrochanteric region. MRI examination for evaluation for bone marrow edema/nondisplaced fracture could be considered if clinically warranted. 2. Soft tissue swelling and edema about the fracture. No drainable fluid collection or hematoma/seroma. Electronically Signed   By: Keane Police D.O.   On: 02/10/2021 14:49   DG HIPS BILAT WITH PELVIS 3-4 VIEWS  Result Date: 02/10/2021 CLINICAL DATA:  Found on the floor EXAM: DG HIP (WITH OR WITHOUT PELVIS) 3-4V BILAT COMPARISON:  02/02/2021 FINDINGS: There is comminuted fracture in the upper aspect of greater tuberosity with slight superior displacement of fracture fragment. As far as seen, there is no demonstrable break in the trabeculae or  cortical margins. Osteopenia is seen in bony structures. Degenerative changes are noted in the lumbar spine. IMPRESSION: Comminuted displaced fracture is seen in the upper aspect of greater  tuberosity of proximal right femur. If clinically warranted, follow-up CT may be considered to evaluate the intertrochanteric portion of proximal right femur. Electronically Signed   By: Elmer Picker M.D.   On: 02/10/2021 12:28    Procedures Procedures   Medications Ordered in ED Medications  acetaminophen (TYLENOL) tablet 650 mg (650 mg Oral Given 02/10/21 1556)    ED Course  I have reviewed the triage vital signs and the nursing notes.  Pertinent labs & imaging results that were available during my care of the patient were reviewed by me and considered in my medical decision making (see chart for details).  Clinical Course as of 02/10/21 1620  Thu Feb 10, 2021  1452 "1. Mildly displaced comminuted fracture of the superior aspect of  the greater trochanter." [SG]    Clinical Course User Index [SG] Jeanell Sparrow, DO   MDM Rules/Calculators/A&P                           CC: Fall  This patient complains of fall; this involves an extensive number of treatment options and is a complaint that carries with it a high risk of complications and morbidity. Vital signs were reviewed. Serious etiologies considered.  Record review:   Previous records obtained and reviewed   Additional history obtained from son  Work up as above, notable for:  Labs & imaging results that were available during my care of the patient were reviewed by me and considered in my medical decision making.   I ordered imaging studies which included CT head, C-spine, hip bilateral, CT femur.  And I independently visualized and interpreted imaging which showed CT femur with mildly displaced comminuted fracture of the superior aspect of the greater trochanter.  No extension of the femoral neck.  CT head and C-spine were  negative for acute process  Management: Patient given Tylenol  Reassessment:  Discussed with orthopedics regarding greater trochanter fracture, recommend weightbearing as tolerated.  No acute surgical intervention necessary at this time.  Patient presently resides at nursing facility, she has significant reduced ambulation at baseline and does not typically ambulate. She is wheel chair bound per her son and has been attempting to ambulate on her own which is believed to be what has been provoking her falls.   Her CPK is pending at this time and if this is stable favor discharge.   Pain well controlled, lower extremities neurovascular intact.  Recommend discharge back to nursing facility with outpatient follow-up.   Care signed out to Dr Zenia Resides at shift change.         This chart was dictated using voice recognition software.  Despite best efforts to proofread,  errors can occur which can change the documentation meaning.  Final Clinical Impression(s) / ED Diagnoses Final diagnoses:  Closed displaced fracture of greater trochanter of right femur, initial encounter ([place])  Fall, initial encounter    Rx / DC Orders ED Discharge Orders     None        Jeanell Sparrow, DO 02/10/21 1620

## 2021-02-10 NOTE — Progress Notes (Signed)
Called regarding right greater trochanter fracture, CT does not look like there is extension medially.  Ok to [place] and f/u with me in 1-2 weeks as long as she can mobilize.    Johnny Bridge, MD

## 2021-02-10 NOTE — ED Notes (Signed)
EMS reports at [facility].  Responsive to question but has confusions.  States did not fall or feel any pain.  Told she was found on floor patient has no recognition of such events.  No injury noted.  No skin abrasion or bumps noted to head

## 2021-02-10 NOTE — ED Triage Notes (Signed)
Pt BIB [facility] from [facility], unwitnessed fall, pt found on floor at 0930 today. Hx of afib,. No thinners

## 2021-02-10 NOTE — ED Notes (Signed)
Handoff report given to [facility] at [facility]

## 2021-02-10 NOTE — ED Notes (Signed)
EMS vitals en route BP 138/62, hr 68, CBG 138 Unwitnessed fall. Pt denies pain. No blood thinners.

## 2021-02-10 NOTE — ED Triage Notes (Signed)
Found on floor at skilled nursing facility. Patient denies any pain or falling.  States she is feeling fine.

## 2021-02-23 ENCOUNTER — Non-Acute Institutional Stay: Payer: Medicare Other | Admitting: Family Medicine

## 2021-02-23 ENCOUNTER — Encounter: Payer: Self-pay | Admitting: Family Medicine

## 2021-02-23 ENCOUNTER — Other Ambulatory Visit: Payer: Self-pay

## 2021-02-23 DIAGNOSIS — K920 Hematemesis: Secondary | ICD-10-CM

## 2021-02-23 DIAGNOSIS — S72001S Fracture of unspecified part of neck of right femur, sequela: Secondary | ICD-10-CM

## 2021-02-23 DIAGNOSIS — N184 Chronic kidney disease, stage 4 (severe): Secondary | ICD-10-CM

## 2021-02-23 NOTE — Progress Notes (Signed)
[facility] Pall[facility] Consult Note Telephone: [phone]  Fax: [phone]   Date of encounter: 02/23/21 2:45 PM PATIENT NAME: Tanya Koch [address] [place] [address]   [phone] (home)  DOB: 08-11-1928 MRN: [id number] PRIMARY CARE PROVIDER:    Earmon Phoenix, NP,  [place] [address] [phone]  REFERRING PROVIDER:   Earmon Phoenix, NP [address] [place],  [place] [address] [phone]  RESPONSIBLE PARTY:    Contact Information     Name Relation Home Work Mobile   Tanya, Koch [phone]     [place] Other   [phone]        I met face to face with patient at [facility] in [place] unit of facility. Palliative Care was asked to follow this patient by consultation request of  Tanya Phoenix, NP to address advance care planning and complex medical decision making. This is the initial visit. Plan is to speak with her family tomorrow for further updates and care planning.                                    ASSESSMENT, SYMPTOM MANAGEMENT AND PLAN / RECOMMENDATIONS:  Closed right hip fracture-pain controlled, non-surgical management, continue Tylenol prn and Tramadol if has severe pain along with positioning.  WBAT to stand/pivot or transfer only with assistance due to fall risk. Dementia-not currently on meds, in ALF-memory care.  Address goals of care with legal guardian. GERD with hx of hematemesis in October 2022 with stable HGB, thought to be due to mallory weiss tear from vomiting.  Continue Protonix. Hx of CHF-current edema is more dependent, appears euvolemic without complaints. Continue lasix with increase in edema or when symptomatic with weight gain 2 lbs/24 hrs or 5 lbs/5 days. CKD stage IV-supplemented on Mag ox daily, potassium WNL. Encourage fluids and avoid nephrotoxic substances.  Advance Care Planning/Goals of Care: To discuss with legal guardian/son  tomorrow.  CODE STATUS: TBD on discussion with legal guardian Does have advanced directive on file from 2000 indicating if she has incurable disease, illness or irreversible physical or mental condition determined by 2 physicians  to be terminal and that lifesaving measures would only prolong the dying process has directed that such procedures be withheld or withdrawn including CPR, artificial respiration, antibiotics, and artificial nutrition/hydration.    Follow up Palliative Care Visit: Palliative care will continue to follow for complex medical decision making, advance care planning, and clarification of goals. Return 8 weeks or prn.  I spent 30 minutes providing this consultation. More than 50% of the time in this consultation was spent in counseling and care coordination.  This visit was coded based on medical decision making (MDM).  PPS: 50%  HOSPICE ELIGIBILITY/DIAGNOSIS: TBD  Chief Complaint: Pt with advanced age, recurrent falls and recent fall with right hip fracture.   "I feel fine. The only pain I have is my spouse."  HISTORY OF PRESENT ILLNESS:  Tanya Koch is a 87 y.o. year old female with dementia and recurrent falls who suffered a fall on 02/02/2021. CT head and cervical spine negative for fracture. CT head with stable findings consistent with chronic microvascular ischemic change, degenerative disc disease in neck.  Bilat hip and pelvis xray: Comminuted fracture in upper aspect of greater tuberosity with slight superior displacement of fracture fragment.  CT right femur showed comminuted, minimally displaced fracture of the greater trochanter without  extension into the femoral neck, hematoma or seroma.  On discussion with son and orthopedist in ED, ortho recommended weight bearing as tolerated (WBAT).  She was a resident of SNF, wc bound but attempting to ambulate on her own contributing to falls. Pt was determined to be medically managed, pain remains controlled.  Per  facility staff pt can stand and shower with assistance, has not complained of significant pain in her hip.  History obtained from review of EMR, and interview with facility staff/caregiver and/or Ms. Tanya Koch.  I reviewed available labs, medications, imaging, studies and related documents from the EMR.  Records reviewed and summarized above.   ROS Limited due to dementia General: NAD-"I'm healthy" EYES: denies vision changes ENMT: facility staff denies dysphagia, reports hard of hearing [facility]) Cardiovascular: Denies chest pain Pulmonary: denies cough, denies increased SOB Abd:  unable to answer, appetite good per staff but pt "picky" MSK: no falls reported since ED visit Skin: unable to answer Neurological: denies pain Psych: Endorses positive mood Heme/lymph/immuno: unable to answer. Staff noted some minimal blood in brief  Physical Exam: Current and past weights: Last weight noted of 151 lbs as of 02/10/21 Constitutional: NAD General: WNWD female seen seated and self propelling WC using her feet  EYES: anicteric sclera, lids intact, no discharge  ENMT: HOH, oral mucous membranes moist, no lower teeth CV: S1S2, RRR, 1+ RLE edema/Trace on left Pulmonary: CTAB, no increased work of breathing, no cough, room air Abdomen: intake 100%, normo-active BS + 4 quadrants, soft and non tender, no ascites GU: deferred MSK: no sarcopenia, moves all extremities, wc BOUND/CAN STAND for short periods with standby assist Skin: warm and dry, no rashes or wounds on visible skin Neuro:  no generalized weakness Psych: non-anxious affect, A and O to self and place Hem/lymph/immuno: no widespread bruising  CURRENT PROBLEM LIST:  Patient Active Problem List   Diagnosis Date Noted   Closed right hip fracture, sequela 02/10/2021   Dementia without behavioral disturbance ([place]) 12/05/2020   GERD (gastroesophageal reflux disease) 12/05/2020   PAST MEDICAL HISTORY:  Active Ambulatory Problems    Diagnosis  Date Noted   Dementia without behavioral disturbance ([place]) 12/05/2020   GERD (gastroesophageal reflux disease) 12/05/2020   Closed right hip fracture, sequela 02/10/2021   Resolved Ambulatory Problems    Diagnosis Date Noted   Hematemesis 12/05/2020   COVID 12/06/2020   Past Medical History:  Diagnosis Date   Bradycardia    CHF (congestive heart failure) (HCC)    Emphysema, unspecified (HCC)    History of MI (myocardial infarction)    Hypertension    SOCIAL HX:  Social History   Tobacco Use   Smoking status: Former    Years: 50.00    Types: Cigarettes    Passive exposure: Never   Smokeless tobacco: Never  Substance Use Topics   Alcohol use: Not Currently   FAMILY HX: History reviewed. No pertinent family history.    ALLERGIES: No Known Allergies   PERTINENT MEDICATIONS:  Outpatient Encounter Medications as of 02/23/2021  Medication Sig   acetaminophen (TYLENOL) 325 MG tablet Take 650 mg by mouth every 6 (six) hours as needed for moderate pain.   ALPRAZolam (XANAX) 0.25 MG tablet Take 0.25 mg by mouth 2 (two) times daily as needed.   amLODipine (NORVASC) 5 MG tablet Take 5 mg by mouth daily.   Cranberry 450 MG TABS Take 450 mg by mouth 2 (two) times daily.   escitalopram (LEXAPRO) 10 MG tablet Take 10 mg  by mouth daily.   furosemide (LASIX) 20 MG tablet Take 20 mg by mouth daily.   magnesium oxide (MAG-OX) 400 (240 Mg) MG tablet Take 400 mg by mouth at bedtime.   Multiple Vitamin (MULTIVITAMIN) TABS Take 1 tablet by mouth daily.   ondansetron (ZOFRAN) 4 MG tablet Take 1 tablet (4 mg total) by mouth every 8 (eight) hours as needed for nausea or vomiting.   pantoprazole (PROTONIX) 40 MG tablet Take 40 mg by mouth daily.   rosuvastatin (CRESTOR) 10 MG tablet Take 10 mg by mouth at bedtime.   traMADol (ULTRAM) 50 MG tablet Take 50 mg by mouth every 6 (six) hours as needed.   No facility-administered encounter medications on file as of 02/23/2021.   Thank you for the  opportunity to participate in the care of Ms. Lemire.  The palliative care team will continue to follow. Please call our office at [phone] if we can be of additional assistance.   Marijo Conception, FNP -C  COVID-19 PATIENT SCREENING TOOL Asked and negative response unless otherwise noted:  Have you had symptoms of covid, tested positive or been in contact with someone with symptoms/positive test in the past 5-10 days? No

## 2021-02-23 NOTE — Assessment & Plan Note (Signed)
Thought to be secondary to vomiting while on antibiotics for dental abscess with mallory weiss tear.  Started on Protonix.

## 2021-03-03 ENCOUNTER — Other Ambulatory Visit: Payer: Self-pay

## 2021-03-03 ENCOUNTER — Ambulatory Visit (HOSPITAL_COMMUNITY): Payer: Medicare Other | Attending: Internal Medicine

## 2021-03-03 DIAGNOSIS — R001 Bradycardia, unspecified: Secondary | ICD-10-CM | POA: Diagnosis not present

## 2021-03-03 DIAGNOSIS — I10 Essential (primary) hypertension: Secondary | ICD-10-CM | POA: Insufficient documentation

## 2021-03-03 LAB — ECHOCARDIOGRAM COMPLETE
Area-P 1/2: 2.34 cm2
S' Lateral: 2.95 cm

## 2021-03-04 NOTE — Progress Notes (Signed)
Let patient know echo shows normal heart function without significant valvular issues.

## 2021-03-26 ENCOUNTER — Emergency Department (HOSPITAL_COMMUNITY)
Admission: EM | Admit: 2021-03-26 | Discharge: 2021-03-26 | Disposition: A | Discharge status: Home / Self Care | Payer: Medicare Other | Attending: Emergency Medicine | Admitting: Emergency Medicine

## 2021-03-26 ENCOUNTER — Emergency Department (HOSPITAL_COMMUNITY): Payer: Medicare Other

## 2021-03-26 ENCOUNTER — Encounter (HOSPITAL_COMMUNITY): Payer: Self-pay | Admitting: Emergency Medicine

## 2021-03-26 DIAGNOSIS — S01511A Laceration without foreign body of lip, initial encounter: Secondary | ICD-10-CM | POA: Diagnosis present

## 2021-03-26 DIAGNOSIS — S0990XA Unspecified injury of head, initial encounter: Secondary | ICD-10-CM

## 2021-03-26 DIAGNOSIS — W19XXXA Unspecified fall, initial encounter: Secondary | ICD-10-CM

## 2021-03-26 DIAGNOSIS — S90412A Abrasion, left great toe, initial encounter: Secondary | ICD-10-CM | POA: Diagnosis not present

## 2021-03-26 DIAGNOSIS — Z79899 Other long term (current) drug therapy: Secondary | ICD-10-CM | POA: Diagnosis not present

## 2021-03-26 DIAGNOSIS — W050XXA Fall from non-moving wheelchair, initial encounter: Secondary | ICD-10-CM | POA: Diagnosis not present

## 2021-03-26 DIAGNOSIS — S90415A Abrasion, left lesser toe(s), initial encounter: Secondary | ICD-10-CM

## 2021-03-26 NOTE — ED Notes (Addendum)
[facility] called for transport back to [facility] at [place]. Report given to [place] at [place].

## 2021-03-26 NOTE — ED Notes (Signed)
Son at bedside.

## 2021-03-26 NOTE — Discharge Instructions (Signed)
Return for any problem.   Suture placed today is observable.  This will fall out on its own over the next week.

## 2021-03-26 NOTE — ED Notes (Signed)
Contacted patient's legal guardian, Gershon Mussel, who reports he is on the way to the hospital to be with patient.

## 2021-03-26 NOTE — ED Notes (Signed)
Patient assisted to restroom using the steady and given ginger ale.

## 2021-03-26 NOTE — ED Notes (Signed)
ED Provider at bedside. 

## 2021-03-26 NOTE — ED Triage Notes (Signed)
Per EMS, patient from [facility] memory care, unwitnessed fall. Skin tears to L lip and hand. Denies neck and back pain.

## 2021-03-26 NOTE — ED Provider Notes (Signed)
[facility] DEPT Provider Note   CSN: [id number] Arrival date & time: 03/26/21  1225     History  Chief Complaint  Patient presents with   Tanya Koch is a 88 y.o. female.  88 year old female with prior medical history detailed below presents for evaluation.  Patient cannot recall events of fall.  Patient's son, regarding, who is at bedside.  He reports the patient fell from her wheelchair this morning.  Fall was unwitnessed.  Patient is at her normal mental status.  Son noted abrasion to the left great toe.  Also, laceration to the right upper lip noted.  No other evidence of significant trauma.  Patient is otherwise acting normal.  Patient without recent reported illness or concern for the son.  The history is provided by the patient, medical records and a relative.  Fall This is a new problem. The current episode started 1 to 2 hours ago. The problem occurs constantly. The problem has not changed since onset.Pertinent negatives include no chest pain and no abdominal pain. Nothing aggravates the symptoms. Nothing relieves the symptoms.      Home Medications Prior to Admission medications   Medication Sig Start Date End Date Taking? Authorizing Provider  acetaminophen (TYLENOL) 325 MG tablet Take 650 mg by mouth every 6 (six) hours as needed for moderate pain.    [provider]  ALPRAZolam Duanne Moron) 0.25 MG tablet Take 0.25 mg by mouth 2 (two) times daily as needed. 07/15/20   [provider]  amLODipine (NORVASC) 5 MG tablet Take 5 mg by mouth daily. 06/21/20   [provider]  Cranberry 450 MG TABS Take 450 mg by mouth 2 (two) times daily.    [provider]  escitalopram (LEXAPRO) 10 MG tablet Take 10 mg by mouth daily. 07/15/20   [provider]  furosemide (LASIX) 20 MG tablet Take 20 mg by mouth daily. 07/15/20   [provider]  magnesium oxide (MAG-OX) 400 (240 Mg) MG tablet Take 400  mg by mouth at bedtime. 07/30/20   [provider]  Multiple Vitamin (MULTIVITAMIN) TABS Take 1 tablet by mouth daily. 07/20/20   [provider]  ondansetron (ZOFRAN) 4 MG tablet Take 1 tablet (4 mg total) by mouth every 8 (eight) hours as needed for nausea or vomiting. 12/04/20   Curatolo, Adam, DO  pantoprazole (PROTONIX) 40 MG tablet Take 40 mg by mouth daily. 07/08/20   [provider]  rosuvastatin (CRESTOR) 10 MG tablet Take 10 mg by mouth at bedtime. 06/20/20   [provider]  traMADol (ULTRAM) 50 MG tablet Take 50 mg by mouth every 6 (six) hours as needed.    [provider]      Allergies    Patient has no known allergies.    Review of Systems   Review of Systems  Cardiovascular:  Negative for chest pain.  Gastrointestinal:  Negative for abdominal pain.  All other systems reviewed and are negative.  Physical Exam Updated Vital Signs BP (!) 159/52 (BP Location: Left Arm)    Pulse (!) 42    Temp (!) 97.4 F (36.3 C) (Oral)    Resp 18    SpO2 100%  Physical Exam Vitals and nursing note reviewed.  Constitutional:      General: She is not in acute distress.    Appearance: Normal appearance. She is well-developed.  HENT:     Head: Normocephalic.     Comments: 0.5 cm laceration  to the left upper lip.  No active bleeding noted.  Normal upper jaw dentition noted.  No evidence of dental trauma. Eyes:     Conjunctiva/sclera: Conjunctivae normal.     Pupils: Pupils are equal, round, and reactive to light.  Cardiovascular:     Rate and Rhythm: Normal rate and regular rhythm.     Heart sounds: Normal heart sounds.  Pulmonary:     Effort: Pulmonary effort is normal. No respiratory distress.     Breath sounds: Normal breath sounds.  Abdominal:     General: There is no distension.     Palpations: Abdomen is soft.     Tenderness: There is no abdominal tenderness.  Musculoskeletal:        General: No deformity. Normal range of motion.      Cervical back: Normal range of motion and neck supple.  Skin:    General: Skin is warm and dry.     Comments: Superficial abrasion noted to the medial aspect of the left great toe.  Neurological:     General: No focal deficit present.     Mental Status: She is alert and oriented to person, place, and time.    ED Results / Procedures / Treatments   Labs (all labs ordered are listed, but only abnormal results are displayed) Labs Reviewed - No data to display  EKG None  Radiology No results found.  Procedures .Marland KitchenLaceration Repair  Date/Time: 03/26/2021 1:33 PM Performed by: Valarie Merino, MD Authorized by: Valarie Merino, MD   Consent:    Consent obtained:  Verbal   Consent given by:  Patient and guardian   Risks, benefits, and alternatives were discussed: yes     Risks discussed:  Infection, need for additional repair, nerve damage, poor wound healing, poor cosmetic result and pain   Alternatives discussed:  No treatment Universal protocol:    Immediately prior to procedure, a time out was called: yes     Patient identity confirmed:  Verbally with patient Anesthesia:    Anesthesia method:  None Laceration details:    Location:  Lip   Lip location:  Upper exterior lip   Length (cm):  0.5 Pre-procedure details:    Preparation:  Patient was prepped and draped in usual sterile fashion Exploration:    Limited defect created (wound extended): no     Hemostasis achieved with:  Direct pressure   Imaging outcome: foreign body not noted     Wound exploration: wound explored through full range of motion     Contaminated: no   Treatment:    Area cleansed with:  Povidone-iodine   Amount of cleaning:  Standard   Debridement:  None   Undermining:  None Skin repair:    Repair method:  Sutures   Suture size:  5-0   Suture technique:  Simple interrupted   Number of sutures:  1 Approximation:    Approximation:  Close   Vermilion border well-aligned: yes   Repair type:     Repair type:  Simple Post-procedure details:    Dressing:  Open (no dressing)   Procedure completion:  Tolerated well, no immediate complications    Medications Ordered in ED Medications - No data to display  ED Course/ Medical Decision Making/ A&P                           Medical Decision Making Amount and/or Complexity of Data Reviewed Radiology: ordered.  Medical Screen Complete  This patient presented to the ED with complaint of fall, head injury, lip laceration.  This complaint involves an extensive number of treatment options. The initial differential diagnosis includes, but is not limited to, fall, head injury, facial trauma, other trauma from fall, metabolic abnormality, syncope, etc  This presentation is: Acute, Chronic, Previously Undiagnosed, Uncertain Prognosis, Complicated, Systemic Symptoms, and Threat to Life/Bodily Function  Patient with unwitnessed fall that occurred at her facility.  Patient did have a superficial laceration to the left upper lip.  This required repair with a single suture.  Patient with small abrasion to the left great toe.  Patient's son at bedside.  Options for treatment and work-up extensively discussed with the patient's son.  He agreed that extensive work-up would not be warranted.  CT head obtained that shows no acute abnormality.  Lip laceration repaired without difficulty. Tetanus UTD.  Patient and patient's son now desires DC.  Understand need for close follow-up.  Strict return precautions given and understood   Co morbidities that complicated the patient's evaluation  Advanced age   Additional history obtained:  Additional history obtained from [facility] External records from outside sources obtained and reviewed including prior ED visits and prior Inpatient records.    Lab Tests:  Treatment and work-up options discussed extensively with son at bedside.  Patient is 73 years old with advanced dementia.  Patient has  valid DNR.  Patient's son does not feel the patient would desire additional extensive work-up.  After discussion and offering of lab draw to evaluate for metabolic abnormalities, son declines.  Agreed upon plan includes CT of the head and repair of lip laceration.   Imaging Studies ordered:  I ordered imaging studies including ct head  I independently visualized and interpreted obtained imaging which showed NAD I agree with the radiologist interpretation.   Cardiac Monitoring:  The patient was maintained on a cardiac monitor.  I personally viewed and interpreted the cardiac monitor which showed an underlying rhythm of: NSR     Test Considered:  Extensive laboratory evaluations (son declined same)      Problem List / ED Course:  Fall with head injury, possible syncopal event   Reevaluation:  After the interventions noted above, I reevaluated the patient and found that they have: stayed the same    Disposition:  After consideration of the diagnostic results and the patients response to treatment, I feel that the patent would benefit from close outpatient follow-up.          Final Clinical Impression(s) / ED Diagnoses Final diagnoses:  Fall, initial encounter  Injury of head, initial encounter  Abrasion of toe of left foot, initial encounter  Lip laceration, initial encounter    Rx / DC Orders ED Discharge Orders     None         Valarie Merino, MD 03/26/21 1419

## 2021-03-26 NOTE — ED Notes (Signed)
Patient assisted to restroom using steady.

## 2021-04-06 ENCOUNTER — Other Ambulatory Visit: Payer: Self-pay

## 2021-04-06 ENCOUNTER — Emergency Department (HOSPITAL_COMMUNITY)
Admission: EM | Admit: 2021-04-06 | Discharge: 2021-04-06 | Disposition: A | Discharge status: Skilled Nursing Facility | Payer: Medicare Other | Attending: Emergency Medicine | Admitting: Emergency Medicine

## 2021-04-06 ENCOUNTER — Encounter (HOSPITAL_COMMUNITY): Payer: Self-pay

## 2021-04-06 DIAGNOSIS — F0390 Unspecified dementia without behavioral disturbance: Secondary | ICD-10-CM | POA: Insufficient documentation

## 2021-04-06 DIAGNOSIS — K625 Hemorrhage of anus and rectum: Secondary | ICD-10-CM

## 2021-04-06 LAB — COMPREHENSIVE METABOLIC PANEL
ALT: 10 U/L (ref 0–44)
AST: 26 U/L (ref 15–41)
Albumin: 3 g/dL — ABNORMAL LOW (ref 3.5–5.0)
Alkaline Phosphatase: 50 U/L (ref 38–126)
Anion gap: 6 (ref 5–15)
BUN: 39 mg/dL — ABNORMAL HIGH (ref 8–23)
CO2: 28 mmol/L (ref 22–32)
Calcium: 8.5 mg/dL — ABNORMAL LOW (ref 8.9–10.3)
Chloride: 102 mmol/L (ref 98–111)
Creatinine, Ser: 2.02 mg/dL — ABNORMAL HIGH (ref 0.44–1.00)
GFR, Estimated: 23 mL/min — ABNORMAL LOW (ref >60)
Glucose, Bld: 102 mg/dL — ABNORMAL HIGH (ref 70–99)
Potassium: 4.1 mmol/L (ref 3.5–5.1)
Sodium: 136 mmol/L (ref 135–145)
Total Bilirubin: 1.3 mg/dL — ABNORMAL HIGH (ref 0.3–1.2)
Total Protein: 6.7 g/dL (ref 6.5–8.1)

## 2021-04-06 LAB — CBC WITH DIFFERENTIAL/PLATELET
Abs Immature Granulocytes: 0.04 10*3/uL (ref 0.00–0.07)
Basophils Absolute: 0 10*3/uL (ref 0.0–0.1)
Basophils Relative: 0 %
Eosinophils Absolute: 0.4 10*3/uL (ref 0.0–0.5)
Eosinophils Relative: 5 %
HCT: 31 % — ABNORMAL LOW (ref 36.0–46.0)
Hemoglobin: 10.1 g/dL — ABNORMAL LOW (ref 12.0–15.0)
Immature Granulocytes: 1 %
Lymphocytes Relative: 12 %
Lymphs Abs: 1 10*3/uL (ref 0.7–4.0)
MCH: 33.2 pg (ref 26.0–34.0)
MCHC: 32.6 g/dL (ref 30.0–36.0)
MCV: 102 fL — ABNORMAL HIGH (ref 80.0–100.0)
Monocytes Absolute: 1.2 10*3/uL — ABNORMAL HIGH (ref 0.1–1.0)
Monocytes Relative: 14 %
Neutro Abs: 5.9 10*3/uL (ref 1.7–7.7)
Neutrophils Relative %: 68 %
Platelets: 276 10*3/uL (ref 150–400)
RBC: 3.04 MIL/uL — ABNORMAL LOW (ref 3.87–5.11)
RDW: 14.9 % (ref 11.5–15.5)
WBC: 8.6 10*3/uL (ref 4.0–10.5)
nRBC: 0 % (ref 0.0–0.2)

## 2021-04-06 LAB — TYPE AND SCREEN
ABO/RH(D): O POS
Antibody Screen: NEGATIVE

## 2021-04-06 LAB — LACTIC ACID, PLASMA: Lactic Acid, Venous: 1.1 mmol/L (ref 0.5–1.9)

## 2021-04-06 NOTE — Discharge Instructions (Addendum)
As discussed, you have been diagnosed with rectal bleeding.  Often this is a result of diverticulosis, though your hemorrhoids may also be contributing.  If you are otherwise well, please be sure to follow-up with your physician for ongoing monitoring.  If you develop new, or concerning changes in your condition, do not hesitate to return here.

## 2021-04-06 NOTE — ED Notes (Addendum)
[facility] has been called for transport and report has been given to [employer] [facility] [place].

## 2021-04-06 NOTE — ED Provider Notes (Signed)
[facility] DEPT Provider Note   CSN: [id number] Arrival date & time: 04/06/21  1033     History  Chief Complaint  Patient presents with   Weakness   Rectal Bleeding    Tanya Koch is a 88 y.o. female.  HPI Patient presents from nursing facility with staff concern of dark stool and bright red blood per rectum.  The patient has dementia, limiting her ability to provide details, level 5 caveat.  Per nursing home report patient was witnessed to have black stool earlier today, and on checking there is right red blood per rectum as well.  No report of bleeding when not defecating, no report of change in behavior, no report of hemodynamic instability by EMS.  Patient denies any complaints, states that she lives in [place].    Home Medications Prior to Admission medications   Medication Sig Start Date End Date Taking? Authorizing Provider  acetaminophen (TYLENOL) 325 MG tablet Take 650 mg by mouth every 6 (six) hours as needed for moderate pain.    [provider]  ALPRAZolam Duanne Moron) 0.25 MG tablet Take 0.25 mg by mouth 2 (two) times daily as needed. 07/15/20   [provider]  amLODipine (NORVASC) 5 MG tablet Take 5 mg by mouth daily. 06/21/20   [provider]  Cranberry 450 MG TABS Take 450 mg by mouth 2 (two) times daily.    [provider]  escitalopram (LEXAPRO) 10 MG tablet Take 10 mg by mouth daily. 07/15/20   [provider]  furosemide (LASIX) 20 MG tablet Take 20 mg by mouth daily. 07/15/20   [provider]  magnesium oxide (MAG-OX) 400 (240 Mg) MG tablet Take 400 mg by mouth at bedtime. 07/30/20   [provider]  Multiple Vitamin (MULTIVITAMIN) TABS Take 1 tablet by mouth daily. 07/20/20   [provider]  ondansetron (ZOFRAN) 4 MG tablet Take 1 tablet (4 mg total) by mouth every 8 (eight) hours as needed for nausea or vomiting. 12/04/20   Curatolo, Adam, DO  pantoprazole  (PROTONIX) 40 MG tablet Take 40 mg by mouth daily. 07/08/20   [provider]  rosuvastatin (CRESTOR) 10 MG tablet Take 10 mg by mouth at bedtime. 06/20/20   [provider]  traMADol (ULTRAM) 50 MG tablet Take 50 mg by mouth every 6 (six) hours as needed.    [provider]      Allergies    Patient has no known allergies.    Review of Systems   Review of Systems  Unable to perform ROS: Dementia   Physical Exam Updated Vital Signs BP (!) 145/62    Pulse (!) 102    Temp (!) 97.3 F (36.3 C) (Oral)    Resp 17    SpO2 93%  Physical Exam Vitals and nursing note reviewed. Exam conducted with a chaperone present.  Constitutional:      General: She is not in acute distress.    Appearance: She is well-developed.  HENT:     Head: Normocephalic and atraumatic.  Eyes:     Conjunctiva/sclera: Conjunctivae normal.  Cardiovascular:     Rate and Rhythm: Normal rate and regular rhythm.  Pulmonary:     Effort: Pulmonary effort is normal. No respiratory distress.     Breath sounds: Normal breath sounds. No stridor.  Abdominal:     General: There is no distension.  Genitourinary:    Skin:    General: Skin is warm and dry.  Neurological:  Mental Status: She is alert.     Cranial Nerves: No cranial nerve deficit or dysarthria.     Motor: Atrophy present. No tremor or abnormal muscle tone.    ED Results / Procedures / Treatments   Labs (all labs ordered are listed, but only abnormal results are displayed) Labs Reviewed  COMPREHENSIVE METABOLIC PANEL - Abnormal; Notable for the following components:      Result Value   Glucose, Bld 102 (*)    BUN 39 (*)    Creatinine, Ser 2.02 (*)    Calcium 8.5 (*)    Albumin 3.0 (*)    Total Bilirubin 1.3 (*)    GFR, Estimated 23 (*)    All other components within normal limits  CBC WITH DIFFERENTIAL/PLATELET - Abnormal; Notable for the following components:   RBC 3.04 (*)    Hemoglobin 10.1 (*)    HCT 31.0 (*)     MCV 102.0 (*)    Monocytes Absolute 1.2 (*)    All other components within normal limits  LACTIC ACID, PLASMA  TYPE AND SCREEN    EKG None  Radiology No results found.  Procedures Procedures    Medications Ordered in ED Medications - No data to display  ED Course/ Medical Decision Making/ A&P  This patient presents to the ED for concern of possible rectal bleeding acute versus occult, this involves an extensive number of treatment options, and is a complaint that carries with it a high risk of complications and morbidity.  The differential diagnosis includes hemorrhoids, diverticulosis, active exsanguination, infection, medication associated benign bleed   Co morbidities that complicate the patient evaluation  Dementia, age   Social Determinants of Health:  Dementia, age   Additional history obtained:  Additional history and/or information obtained from EMS notes, chart review External records from outside source obtained and reviewed including no hemodynamic instability in transport, nursing home note as above   After the initial evaluation, orders, including: Type and screen, CBC, chemistry were initiated.  Patient placed on Cardiac and Pulse-Oximetry Monitors. The patient was maintained on a cardiac monitor.  The cardiac monitored showed an rhythm of sinus rhythm, 60s The patient was also maintained on pulse oximetry. The readings were typically needed percent room air normal  On repeat evaluation of the patient stayed the same she is now accompanied by her daughter-in-law, caregiver.  Patient is awake, alert, smiling, making mild jokes.  We discussed all findings, at length and physical exam at length  Lab Tests:  I personally interpreted labs.  The pertinent results include: Hemoglobin within typical range, creatinine slightly improved from baseline acknowledging CKD   Dispostion / Final MDM: Hospitalization considered given her age, reported bleed. After  consideration of the diagnostic results and the patient's response to treatment, she is appropriate for outpatient follow-up with gastroenterology, primary care.  I feel that the patent would benefit from discharge with monitoring, close outpatient follow-up.  Daughter and I discussed patient's history, including what sounds to be a prior episode of diverticulosis, prior episode of ACS with subsequent decreased ejection fraction, which may have improved after patient moved here to a nursing facility from her home state of [place].  We discussed diverticulosis, hemorrhoids, possible etiologies for the patient's reported blood.  We discussed return precautions, home monitoring instructions, importance of follow-up, at length..   Final Clinical Impression(s) / ED Diagnoses Final diagnoses:  Rectal bleeding    Rx / DC Orders ED Discharge Orders     None  Carmin Muskrat, MD 04/06/21 1309

## 2021-04-06 NOTE — ED Triage Notes (Signed)
Pt BIB EMS from [facility]. Per EMS, the nursing home therapist reported that she seemed weaker and her nurse saw she had dark stools w/ bright red bleeding. She's alert at baseline with a hx of dementia. She also takes iron pills daily.  50 hr 140/90 20 rr 97% room air 118 cbg

## 2021-07-28 ENCOUNTER — Ambulatory Visit: Payer: Medicare Other | Admitting: Physician Assistant

## 2021-08-09 ENCOUNTER — Other Ambulatory Visit (HOSPITAL_COMMUNITY): Payer: Self-pay

## 2021-08-09 DIAGNOSIS — R1310 Dysphagia, unspecified: Secondary | ICD-10-CM

## 2021-08-16 ENCOUNTER — Ambulatory Visit (HOSPITAL_COMMUNITY)
Admission: RE | Admit: 2021-08-16 | Discharge: 2021-08-16 | Disposition: A | Discharge status: Home / Self Care | Payer: Medicare Other | Source: Ambulatory Visit | Attending: Gerontology | Admitting: Gerontology

## 2021-08-16 DIAGNOSIS — K224 Dyskinesia of esophagus: Secondary | ICD-10-CM | POA: Insufficient documentation

## 2021-08-16 DIAGNOSIS — R059 Cough, unspecified: Secondary | ICD-10-CM | POA: Diagnosis not present

## 2021-08-16 DIAGNOSIS — R633 Feeding difficulties, unspecified: Secondary | ICD-10-CM | POA: Insufficient documentation

## 2021-08-16 DIAGNOSIS — R1310 Dysphagia, unspecified: Secondary | ICD-10-CM | POA: Diagnosis present

## 2021-08-16 NOTE — Progress Notes (Signed)
Modified Barium Swallow Progress Note  Patient Details  Name: Tanya Koch MRN: [id number] Date of Birth: Apr 12, 1928  Today's Date: 08/16/2021  Modified Barium Swallow completed.  Full report located under Chart Review in the Imaging Section.  Brief recommendations include the following:  Clinical Impression  Pt demonstrates mild oral dysphagia with instances of premature spillage and frank flash penetration before the swallow. All WNL for age and not impacting safety with swallowing significantly. However, esopahgeal sweep did show signs of dysmotility and moderate residue after 1/2 a cereal bar given. A barium tablet did not pass the mid-distal esophagus. Offered strategies to daughter in law regarding small, frequent meals, offering liquid supplements between meals and other bis precautions such as avoiding meats, following bites with sips and keeping an upright posture. Pt may benefit from f/u with GI.   Swallow Evaluation Recommendations       SLP Diet Recommendations: Dysphagia 3 (Mech soft) solids;Thin liquid (avoid meats)   Liquid Administration via: Cup;Straw   Medication Administration: Crushed with puree   Supervision: Patient able to self feed   Compensations: Slow rate;Small sips/bites;Follow solids with liquid   Postural Changes: Remain semi-upright after after feeds/meals (Comment)            Tanya Koch, Katherene Ponto 08/16/2021,12:46 PM

## 2021-09-08 ENCOUNTER — Inpatient Hospital Stay (HOSPITAL_COMMUNITY): Payer: Medicare Other

## 2021-09-08 ENCOUNTER — Encounter (HOSPITAL_COMMUNITY): Payer: Self-pay | Admitting: Internal Medicine

## 2021-09-08 ENCOUNTER — Emergency Department (HOSPITAL_COMMUNITY): Payer: Medicare Other

## 2021-09-08 ENCOUNTER — Inpatient Hospital Stay (HOSPITAL_COMMUNITY)
Admission: EM | Admit: 2021-09-08 | Discharge: 2021-09-18 | DRG: 522 | Disposition: A | Discharge status: Skilled Nursing Facility | Payer: Medicare Other | Source: Skilled Nursing Facility | Attending: Family Medicine | Admitting: Family Medicine

## 2021-09-08 DIAGNOSIS — W19XXXA Unspecified fall, initial encounter: Secondary | ICD-10-CM | POA: Diagnosis present

## 2021-09-08 DIAGNOSIS — D62 Acute posthemorrhagic anemia: Secondary | ICD-10-CM | POA: Diagnosis not present

## 2021-09-08 DIAGNOSIS — R680 Hypothermia, not associated with low environmental temperature: Secondary | ICD-10-CM | POA: Diagnosis present

## 2021-09-08 DIAGNOSIS — D72829 Elevated white blood cell count, unspecified: Secondary | ICD-10-CM | POA: Diagnosis present

## 2021-09-08 DIAGNOSIS — Z87891 Personal history of nicotine dependence: Secondary | ICD-10-CM

## 2021-09-08 DIAGNOSIS — I5042 Chronic combined systolic (congestive) and diastolic (congestive) heart failure: Secondary | ICD-10-CM | POA: Diagnosis present

## 2021-09-08 DIAGNOSIS — Z66 Do not resuscitate: Secondary | ICD-10-CM | POA: Diagnosis present

## 2021-09-08 DIAGNOSIS — S72002A Fracture of unspecified part of neck of left femur, initial encounter for closed fracture: Secondary | ICD-10-CM

## 2021-09-08 DIAGNOSIS — I110 Hypertensive heart disease with heart failure: Secondary | ICD-10-CM | POA: Diagnosis not present

## 2021-09-08 DIAGNOSIS — D649 Anemia, unspecified: Secondary | ICD-10-CM | POA: Diagnosis present

## 2021-09-08 DIAGNOSIS — I7090 Unspecified atherosclerosis: Secondary | ICD-10-CM

## 2021-09-08 DIAGNOSIS — N184 Chronic kidney disease, stage 4 (severe): Secondary | ICD-10-CM | POA: Diagnosis present

## 2021-09-08 DIAGNOSIS — Z8616 Personal history of COVID-19: Secondary | ICD-10-CM

## 2021-09-08 DIAGNOSIS — Z8249 Family history of ischemic heart disease and other diseases of the circulatory system: Secondary | ICD-10-CM

## 2021-09-08 DIAGNOSIS — I252 Old myocardial infarction: Secondary | ICD-10-CM

## 2021-09-08 DIAGNOSIS — J439 Emphysema, unspecified: Secondary | ICD-10-CM | POA: Diagnosis present

## 2021-09-08 DIAGNOSIS — S72012A Unspecified intracapsular fracture of left femur, initial encounter for closed fracture: Principal | ICD-10-CM | POA: Diagnosis present

## 2021-09-08 DIAGNOSIS — I10 Essential (primary) hypertension: Secondary | ICD-10-CM | POA: Diagnosis present

## 2021-09-08 DIAGNOSIS — K219 Gastro-esophageal reflux disease without esophagitis: Secondary | ICD-10-CM | POA: Diagnosis present

## 2021-09-08 DIAGNOSIS — I509 Heart failure, unspecified: Secondary | ICD-10-CM | POA: Diagnosis not present

## 2021-09-08 DIAGNOSIS — I6523 Occlusion and stenosis of bilateral carotid arteries: Secondary | ICD-10-CM | POA: Diagnosis present

## 2021-09-08 DIAGNOSIS — Y92129 Unspecified place in nursing home as the place of occurrence of the external cause: Secondary | ICD-10-CM

## 2021-09-08 DIAGNOSIS — I471 Supraventricular tachycardia: Secondary | ICD-10-CM | POA: Diagnosis not present

## 2021-09-08 DIAGNOSIS — F03B0 Unspecified dementia, moderate, without behavioral disturbance, psychotic disturbance, mood disturbance, and anxiety: Secondary | ICD-10-CM | POA: Diagnosis present

## 2021-09-08 DIAGNOSIS — H9190 Unspecified hearing loss, unspecified ear: Secondary | ICD-10-CM | POA: Diagnosis present

## 2021-09-08 DIAGNOSIS — F0390 Unspecified dementia without behavioral disturbance: Secondary | ICD-10-CM | POA: Diagnosis present

## 2021-09-08 DIAGNOSIS — Z79899 Other long term (current) drug therapy: Secondary | ICD-10-CM

## 2021-09-08 DIAGNOSIS — E785 Hyperlipidemia, unspecified: Secondary | ICD-10-CM | POA: Diagnosis present

## 2021-09-08 DIAGNOSIS — I130 Hypertensive heart and chronic kidney disease with heart failure and stage 1 through stage 4 chronic kidney disease, or unspecified chronic kidney disease: Secondary | ICD-10-CM | POA: Diagnosis present

## 2021-09-08 DIAGNOSIS — T68XXXA Hypothermia, initial encounter: Secondary | ICD-10-CM | POA: Diagnosis present

## 2021-09-08 DIAGNOSIS — I779 Disorder of arteries and arterioles, unspecified: Secondary | ICD-10-CM

## 2021-09-08 LAB — CBC WITH DIFFERENTIAL/PLATELET
Abs Immature Granulocytes: 0.05 10*3/uL (ref 0.00–0.07)
Basophils Absolute: 0 10*3/uL (ref 0.0–0.1)
Basophils Relative: 0 %
Eosinophils Absolute: 0.1 10*3/uL (ref 0.0–0.5)
Eosinophils Relative: 1 %
HCT: 32.6 % — ABNORMAL LOW (ref 36.0–46.0)
Hemoglobin: 10.5 g/dL — ABNORMAL LOW (ref 12.0–15.0)
Immature Granulocytes: 0 %
Lymphocytes Relative: 5 %
Lymphs Abs: 0.8 10*3/uL (ref 0.7–4.0)
MCH: 31.7 pg (ref 26.0–34.0)
MCHC: 32.2 g/dL (ref 30.0–36.0)
MCV: 98.5 fL (ref 80.0–100.0)
Monocytes Absolute: 1.7 10*3/uL — ABNORMAL HIGH (ref 0.1–1.0)
Monocytes Relative: 12 %
Neutro Abs: 11.4 10*3/uL — ABNORMAL HIGH (ref 1.7–7.7)
Neutrophils Relative %: 82 %
Platelets: 240 10*3/uL (ref 150–400)
RBC: 3.31 MIL/uL — ABNORMAL LOW (ref 3.87–5.11)
RDW: 15.3 % (ref 11.5–15.5)
WBC: 14 10*3/uL — ABNORMAL HIGH (ref 4.0–10.5)
nRBC: 0 % (ref 0.0–0.2)

## 2021-09-08 LAB — URINALYSIS, ROUTINE W REFLEX MICROSCOPIC
Bilirubin Urine: NEGATIVE
Glucose, UA: NEGATIVE mg/dL
Ketones, ur: NEGATIVE mg/dL
Leukocytes,Ua: NEGATIVE
Nitrite: NEGATIVE
Protein, ur: 100 mg/dL — AB
Specific Gravity, Urine: 1.012 (ref 1.005–1.030)
pH: 6 (ref 5.0–8.0)

## 2021-09-08 LAB — BASIC METABOLIC PANEL
Anion gap: 10 (ref 5–15)
BUN: 53 mg/dL — ABNORMAL HIGH (ref 8–23)
CO2: 23 mmol/L (ref 22–32)
Calcium: 9 mg/dL (ref 8.9–10.3)
Chloride: 106 mmol/L (ref 98–111)
Creatinine, Ser: 2.44 mg/dL — ABNORMAL HIGH (ref 0.44–1.00)
GFR, Estimated: 18 mL/min — ABNORMAL LOW (ref >60)
Glucose, Bld: 121 mg/dL — ABNORMAL HIGH (ref 70–99)
Potassium: 3.9 mmol/L (ref 3.5–5.1)
Sodium: 139 mmol/L (ref 135–145)

## 2021-09-08 MED ORDER — ONDANSETRON HCL 4 MG PO TABS
4.0000 mg | ORAL_TABLET | Freq: Four times a day (QID) | ORAL | Status: DC | PRN
Start: 2021-09-08 — End: 2021-09-09

## 2021-09-08 MED ORDER — SODIUM CHLORIDE 0.9 % IV BOLUS
1000.0000 mL | Freq: Once | INTRAVENOUS | Status: AC
  Administered 2021-09-08: 1000 mL via INTRAVENOUS

## 2021-09-08 MED ORDER — OXYCODONE HCL 5 MG PO TABS: 2.5000 mg | ORAL_TABLET | ORAL | Status: DC | PRN

## 2021-09-08 MED ORDER — ACETAMINOPHEN 650 MG RE SUPP
650.0000 mg | Freq: Four times a day (QID) | RECTAL | Status: DC | PRN
Start: 2021-09-08 — End: 2021-09-09

## 2021-09-08 MED ORDER — HYDROMORPHONE HCL 1 MG/ML IJ SOLN
0.5000 mg | INTRAMUSCULAR | Status: DC | PRN
  Administered 2021-09-10 – 2021-09-16 (×2): 0.5 mg via INTRAVENOUS
  Filled 2021-09-08 (×2): qty 0.5

## 2021-09-08 MED ORDER — ONDANSETRON HCL 4 MG/2ML IJ SOLN: 4.0000 mg | Freq: Four times a day (QID) | INTRAMUSCULAR | Status: DC | PRN

## 2021-09-08 MED ORDER — ACETAMINOPHEN 325 MG PO TABS: 650.0000 mg | ORAL_TABLET | Freq: Four times a day (QID) | ORAL | Status: DC | PRN

## 2021-09-08 NOTE — Progress Notes (Signed)
VASCULAR LAB    Carotid duplex has been performed.  See CV proc for preliminary results.   Severn Goddard, RVT 09/08/2021, 1:39 PM

## 2021-09-08 NOTE — H&P (Signed)
History and Physical    Patient: Tanya Koch DVV:[id number] DOB: 1928/10/25 DOA: 09/08/2021 DOS: the patient was seen and examined on 09/08/2021 PCP: Earmon Phoenix, NP  Patient coming from: SNF  Chief Complaint:  Chief Complaint  Patient presents with   Fall   HPI: Tanya Koch is a 88 y.o. female with medical history significant of bradycardia, history of MI history of unspecified CHF with recovered EF, COVID-19, emphysema, history of hematemesis, hypertension, history of closed right hip fracture in December 2022 who had an unwitnessed fall and was found on the floor at her SNF facility this morning with inability to stand up and bear weight on her LLE.  Due to her dementia.  She is unable to provide any significant history.  She is currently in no acute distress.  ED course: Initial vital signs were temperature 95 F, pulse 71, respiration 12, BP 132/64 and O2 sat 99% on room air.  No medications have been given so far emergency department.  The patient does not seem to be in pain at the moment.  Lab work: CBC is her white count 14.0, hemoglobin 10.5 g/dL platelets 240.  BMP with normal electrolytes.  Glucose 121, BUN 53 and creatinine 2.44 mg/dL.  Imaging: Two-view chest radiograph with no acute process but shows cardiomegaly and aortic atherosclerosis.  Bilateral knees x-ray with degenerative changes but no acute fracture.  CT head and CT cervical spine with no acute abnormalities but showing extensive calcific atherosclerosis of both carotid bifurcations.  CT pelvis and left hip x-ray showing fracture of the left hip.  Please see images and full radiology report for further details.   Review of Systems: As mentioned in the history of present illness. All other systems reviewed and are negative.  Past Medical History:  Diagnosis Date   Bradycardia    CHF (congestive heart failure) ([place])    Closed right hip fracture, sequela 02/10/2021   COVID 12/06/2020   Emphysema, unspecified  ([place])    Hematemesis 12/05/2020   History of MI (myocardial infarction)    Hypertension    Past Surgical History:  Procedure Laterality Date   HIP ARTHROPLASTY Right    Social History:  reports that she has quit smoking. Her smoking use included cigarettes. She has never been exposed to tobacco smoke. She has never used smokeless tobacco. She reports that she does not currently use alcohol. She reports that she does not use drugs.  No Known Allergies  Family History  Problem Relation Age of Onset   Hypertension Other     Prior to Admission medications   Medication Sig Start Date End Date Taking? Authorizing Provider  acetaminophen (TYLENOL) 325 MG tablet Take 650 mg by mouth every 6 (six) hours as needed for moderate pain.   Yes [provider]  ALPRAZolam (XANAX) 0.25 MG tablet Take 0.25 mg by mouth 2 (two) times daily. May also have 1 tablet daily as needed for anxiety 07/15/20  Yes [provider]  amLODipine (NORVASC) 10 MG tablet Take 10 mg by mouth daily. 06/21/20  Yes [provider]  calcium carbonate (TUMS - DOSED IN MG ELEMENTAL CALCIUM) 500 MG chewable tablet Chew 1,000 mg by mouth daily.   Yes [provider]  docusate sodium (COLACE) 100 MG capsule Take 100 mg by mouth at bedtime.   Yes [provider]  Ensure (ENSURE) Take 237 mLs by mouth 2 (two) times daily between meals.   Yes [provider]  escitalopram (LEXAPRO) 10 MG tablet Take  10 mg by mouth daily. 07/15/20  Yes [provider]  ferrous sulfate 325 (65 FE) MG tablet Take 325 mg by mouth daily with breakfast.   Yes [provider]  folic acid (FOLVITE) 1 MG tablet Take 1 mg by mouth every other day. 08/04/21  Yes [provider]  furosemide (LASIX) 20 MG tablet Take 20 mg by mouth daily. 07/15/20  Yes [provider]  magnesium oxide (MAG-OX) 400 (240 Mg) MG tablet Take 400 mg by mouth 2 (two) times daily. 07/30/20  Yes [provider]  ondansetron (ZOFRAN) 4 MG tablet Take 1 tablet (4 mg total) by mouth every 8 (eight) hours as needed for nausea or vomiting. 12/04/20  Yes Curatolo, Adam, DO  pantoprazole (PROTONIX) 40 MG tablet Take 40 mg by mouth daily. 07/08/20  Yes [provider]  rosuvastatin (CRESTOR) 10 MG tablet Take 10 mg by mouth at bedtime. 06/20/20  Yes [provider]  vitamin B-12 (CYANOCOBALAMIN) 1000 MCG tablet Take 2,000 mcg by mouth daily.   Yes [provider]  Zinc 30 MG TABS Take 30 mg by mouth daily.   Yes [provider]    Physical Exam: Vitals:   09/08/21 0915 09/08/21 1041 09/08/21 1125  BP:   132/64  Pulse: (!) 55  (!) 43  Resp: 12  19  Temp:  (!) 95 F (35 C)   TempSrc:  Rectal   SpO2: 99%  100%   Physical Exam Constitutional:      Appearance: She is normal weight.  HENT:     Head: Normocephalic.     Mouth/Throat:     Mouth: Mucous membranes are moist.  Eyes:     General: No scleral icterus.    Pupils: Pupils are equal, round, and reactive to light.  Neck:     Vascular: No JVD.  Cardiovascular:     Rate and Rhythm: Regular rhythm. Tachycardia present. Frequent Extrasystoles are present.    Heart sounds: S1 normal and S2 normal.  Abdominal:     General: Bowel sounds are normal. There is no distension.     Palpations: Abdomen is soft.     Tenderness: There is no abdominal tenderness. There is no right CVA tenderness, left CVA tenderness or guarding.  Musculoskeletal:     Cervical back: Neck supple.     Left hip: Tenderness present. Decreased range of motion. Decreased strength.     Right lower leg: No edema.     Left lower leg: No edema.     Comments: Shortened and mildly laterally rotated LLE.  Skin:    General: Skin is warm and dry.  Neurological:     General: No focal deficit present.     Mental Status: She is alert. Mental status is at baseline.  Psychiatric:        Mood and Affect: Mood normal.    Data  Reviewed:  Results are pending, will review when available.  Assessment and Plan: Principal Problem:   Closed left hip fracture, initial encounter ([place]) Admit to telemetry/inpatient. Ice area as needed. Buck's traction per protocol. Analgesics as needed. Antiemetics as needed. Consult TOC team. Consult nutritional services. PT evaluation after surgery. Orthopedic surgery will evaluate at [facility].  Active Problems:   Bilateral carotid artery disease (HCC) CTA neck recommended, but there is severe CKD history. We will check bilateral carotid artery Doppler.    Hypothermia Blood cultures obtained. Checks x-ray normal. Urinalysis pending. Defer antibiotics for now.    Leukocytosis  In the setting of acute fracture. Follow-up WBC.    Normocytic anemia Monitor hematocrit and hemoglobin. Transfuse as needed.    Dementia without behavioral disturbance ([place]) Supportive care. Continue Lexapro 10 mg p.o. daily.    GERD (gastroesophageal reflux disease) Continue pantoprazole 40 mg p.o. daily.    Chronic kidney disease (CKD), stage IV (severe) (HCC) Monitor renal function electrolytes. Resume furosemide 20 mg p.o. daily in the morning    Hypertension Pain control. Prednisone amlodipine 10 mg p.o. daily in AM. Monitor blood pressure and heart rate.    Emphysema lung ([place]) Supplemental oxygen as needed. Bronchodilators as needed.    Hyperlipidemia Resume rosuvastatin 10 mg in AM.    Advance Care Planning:   Code Status: DNR   Consults: Orthopedic surgery (Dr. Marlou Sa)  Family Communication:   Severity of Illness: The appropriate patient status for this patient is INPATIENT. Inpatient status is judged to be reasonable and necessary in order to provide the required intensity of service to ensure the patient's safety. The patient's presenting symptoms, physical exam findings, and initial radiographic and laboratory data in the context of their chronic comorbidities is felt to  place them at high risk for further clinical deterioration. Furthermore, it is not anticipated that the patient will be medically stable for discharge from the hospital within 2 midnights of admission.   * I certify that at the point of admission it is my clinical judgment that the patient will require inpatient hospital care spanning beyond 2 midnights from the point of admission due to high intensity of service, high risk for further deterioration and high frequency of surveillance required.*  Author: Reubin Milan, MD 09/08/2021 11:33 AM  For on call review [email or web address].   This document was prepared using Dragon voice recognition software and may contain some unintended transcription errors.

## 2021-09-08 NOTE — ED Triage Notes (Signed)
Pt arrives via [facility] from [facility] for unwitnessed fall. Pt was found laying in the floor by day shift at 0830. Pt in c-collar on arrival. Hx of dementia. Alert to person only during triage.

## 2021-09-08 NOTE — ED Notes (Signed)
Put patient in a hospital bed, dimmed lights, pt seems comfortable

## 2021-09-08 NOTE — ED Provider Notes (Signed)
[facility] DEPT Provider Note   CSN: [id number] Arrival date & time: 09/08/21  0857     History  Chief Complaint  Patient presents with   Tanya Koch is a 88 y.o. female.  Level 5 caveat for dementia.  Patient brought in from facility after unwitnessed fall.  She was found lying on the ground by staff.  She seems to have pain to her left hip and right knee.  Does not recall falling.  Unable to give a history.  Denies any head, neck, back, chest or abdominal pain.  No blood thinner use. History of atrial fibrillation, CHF, hypertension  The history is provided by the patient and the EMS personnel.  Fall       Home Medications Prior to Admission medications   Medication Sig Start Date End Date Taking? Authorizing Provider  acetaminophen (TYLENOL) 325 MG tablet Take 650 mg by mouth every 6 (six) hours as needed for moderate pain.    [provider]  ALPRAZolam Duanne Moron) 0.25 MG tablet Take 0.25 mg by mouth 2 (two) times daily as needed. 07/15/20   [provider]  amLODipine (NORVASC) 5 MG tablet Take 5 mg by mouth daily. 06/21/20   [provider]  Cranberry 450 MG TABS Take 450 mg by mouth 2 (two) times daily.    [provider]  escitalopram (LEXAPRO) 10 MG tablet Take 10 mg by mouth daily. 07/15/20   [provider]  furosemide (LASIX) 20 MG tablet Take 20 mg by mouth daily. 07/15/20   [provider]  magnesium oxide (MAG-OX) 400 (240 Mg) MG tablet Take 400 mg by mouth at bedtime. 07/30/20   [provider]  Multiple Vitamin (MULTIVITAMIN) TABS Take 1 tablet by mouth daily. 07/20/20   [provider]  ondansetron (ZOFRAN) 4 MG tablet Take 1 tablet (4 mg total) by mouth every 8 (eight) hours as needed for nausea or vomiting. 12/04/20   Curatolo, Adam, DO  pantoprazole (PROTONIX) 40 MG tablet Take 40 mg by mouth daily. 07/08/20   [provider]  rosuvastatin  (CRESTOR) 10 MG tablet Take 10 mg by mouth at bedtime. 06/20/20   [provider]  traMADol (ULTRAM) 50 MG tablet Take 50 mg by mouth every 6 (six) hours as needed.    [provider]      Allergies    Patient has no known allergies.    Review of Systems   Review of Systems  Unable to perform ROS: Dementia    Physical Exam Updated Vital Signs Pulse (!) 55   Resp 12   SpO2 99%  Physical Exam Vitals and nursing note reviewed.  Constitutional:      General: She is not in acute distress.    Appearance: She is well-developed.  HENT:     Head: Normocephalic and atraumatic.     Mouth/Throat:     Pharynx: No oropharyngeal exudate.  Eyes:     Conjunctiva/sclera: Conjunctivae normal.     Pupils: Pupils are equal, round, and reactive to light.  Neck:     Comments: C collar in place Cardiovascular:     Rate and Rhythm: Normal rate and regular rhythm.     Heart sounds: Normal heart sounds. No murmur heard. Pulmonary:     Effort: Pulmonary effort is normal. No respiratory distress.     Breath sounds: Normal breath sounds.  Chest:     Chest wall: No tenderness.  Abdominal:  Palpations: Abdomen is soft.     Tenderness: There is no abdominal tenderness. There is no guarding or rebound.  Musculoskeletal:        General: Tenderness present.     Cervical back: Normal range of motion and neck supple.     Comments: No shortening or internal rotation.  Pain with range of motion of left hip.  Erythema to left knee without discrete tenderness.  Flexion and extension are intact. Right knee appears swollen but flexion and extension are intact.  Intact DP pulses bilaterally.  No T or L-spine tenderness  Skin:    General: Skin is warm.  Neurological:     Mental Status: She is alert and oriented to person, place, and time.     Cranial Nerves: No cranial nerve deficit.     Motor: No abnormal muscle tone.     Coordination: Coordination normal.     Comments:  5/5 strength  throughout. CN 2-12 intact.Equal grip strength.   Psychiatric:        Behavior: Behavior normal.     ED Results / Procedures / Treatments   Labs (all labs ordered are listed, but only abnormal results are displayed) Labs Reviewed  CBC WITH DIFFERENTIAL/PLATELET - Abnormal; Notable for the following components:      Result Value   WBC 14.0 (*)    RBC 3.31 (*)    Hemoglobin 10.5 (*)    HCT 32.6 (*)    Neutro Abs 11.4 (*)    Monocytes Absolute 1.7 (*)    All other components within normal limits  BASIC METABOLIC PANEL - Abnormal; Notable for the following components:   Glucose, Bld 121 (*)    BUN 53 (*)    Creatinine, Ser 2.44 (*)    GFR, Estimated 18 (*)    All other components within normal limits  URINALYSIS, ROUTINE W REFLEX MICROSCOPIC  LACTIC ACID, PLASMA  LACTIC ACID, PLASMA    EKG EKG Interpretation  Date/Time:  Thursday September 08 2021 10:46:25 EDT Ventricular Rate:  76 PR Interval:  52 QRS Duration: 169 QT Interval:  496 QTC Calculation: 558 R Axis:   -68 Text Interpretation: Sinus rhythm Atrial premature complex Short PR interval Left bundle branch block LBBB Confirmed by Ezequiel Essex [phone]) on 09/08/2021 10:55:01 AM  Radiology DG Chest 2 View  Result Date: 09/08/2021 CLINICAL DATA:  Fall, dementia, left hip fracture EXAM: CHEST - 2 VIEW COMPARISON:  02/10/2021 FINDINGS: Similar cardiomegaly and central vascular congestion. Negative for CHF or definite pneumonia. Minor basilar atelectasis. No effusion or pneumothorax. Trachea midline. Aorta atherosclerotic. No acute osseous finding. Degenerative changes throughout the spine. IMPRESSION: Cardiomegaly without acute process Aortic Atherosclerosis (ICD10-I70.0). Electronically Signed   By: Jerilynn Mages.  Shick M.D.   On: 09/08/2021 10:23   DG Knee Complete 4 Views Right  Result Date: 09/08/2021 CLINICAL DATA:  Fall, left hip fracture EXAM: RIGHT KNEE - COMPLETE 4+ VIEW COMPARISON:  09/08/2021 FINDINGS: Bones are osteopenic.  Diffuse right knee degenerative changes and chondrocalcinosis. No acute osseous finding, fracture, malalignment or effusion. Peripheral atherosclerosis. No focal soft tissue abnormality. IMPRESSION: Osteopenia and degenerative changes. No acute finding by plain radiography Electronically Signed   By: Jerilynn Mages.  Shick M.D.   On: 09/08/2021 10:21   DG Hip Unilat W or Wo Pelvis 2-3 Views Left  Result Date: 09/08/2021 CLINICAL DATA:  Unwitnessed fall, pain EXAM: DG HIP (WITH OR WITHOUT PELVIS) 2-3V LEFT COMPARISON:  09/08/2021 FINDINGS: There is an acute displaced and mildly impacted left hip subcapital  femoral neck fracture noted. Bones are osteopenic. Pelvis and right hip appear intact. Peripheral atherosclerosis noted. IMPRESSION: Acute displaced left hip subcapital femoral neck fracture. Electronically Signed   By: Jerilynn Mages.  Shick M.D.   On: 09/08/2021 10:20   DG Knee Complete 4 Views Left  Result Date: 09/08/2021 CLINICAL DATA:  Unwitnessed fall, dementia EXAM: LEFT KNEE - COMPLETE 4+ VIEW COMPARISON:  09/08/2021 FINDINGS: Bones are osteopenic. Degenerative changes throughout the knee and chondrocalcinosis. No acute osseous finding, fracture, malalignment, or effusion. Patella located. No soft tissue abnormality. Peripheral vascular calcifications present. IMPRESSION: Osteopenia and degenerative changes as above. No acute osseous finding. Peripheral atherosclerosis Electronically Signed   By: Jerilynn Mages.  Shick M.D.   On: 09/08/2021 10:17   CT Head Wo Contrast  Result Date: 09/08/2021 CLINICAL DATA:  Neck trauma (Age >= 65y); Head trauma, moderate-severe EXAM: CT HEAD WITHOUT CONTRAST CT CERVICAL SPINE WITHOUT CONTRAST TECHNIQUE: Multidetector CT imaging of the head and cervical spine was performed following the standard protocol without intravenous contrast. Multiplanar CT image reconstructions of the cervical spine were also generated. RADIATION DOSE REDUCTION: This exam was performed according to the departmental  dose-optimization program which includes automated exposure control, adjustment of the mA and/or kV according to patient size and/or use of iterative reconstruction technique. COMPARISON:  None Available. FINDINGS: CT HEAD FINDINGS Brain: No evidence of acute infarction, hemorrhage, hydrocephalus, extra-axial collection or mass lesion/mass effect. Cerebral atrophy. Vascular: Calcific intracranial atherosclerosis. Skull: No acute fracture. Sinuses/Orbits: Clear sinuses.  No acute orbital findings. Other: No mastoid effusions. CT CERVICAL SPINE FINDINGS Alignment: Unchanged alignment. Similar slight anterolisthesis of C7 on T1. Straightening. Skull base and vertebrae: Vertebral body heights are maintained. No evidence of acute fracture. Soft tissues and spinal canal: No prevertebral fluid or swelling. No visible canal hematoma. Disc levels: Multilevel degenerative change including degenerative disc disease, facet and uncovertebral hypertrophy with varying degrees of neural foraminal stenosis. Upper chest: Visualized lung apices are clear. Other: Extensive calcific atherosclerosis of the right greater than left carotid bifurcations. Subcentimeter thyroid nodules. Further imaging is not required (ref: J Am Coll Radiol. 2015 Feb;12(2): 143-50). IMPRESSION: 1. No evidence of acute intracranial abnormality. 2. No evidence of acute fracture or traumatic malalignment in the cervical spine. 3. Extensive calcific atherosclerosis of the right greater than left carotid bifurcations. Potentially hemodynamically significant stenosis on the right. A CT of the neck could further characterize if clinically warranted. Electronically Signed   By: Margaretha Sheffield M.D.   On: 09/08/2021 09:59   CT Cervical Spine Wo Contrast  Result Date: 09/08/2021 CLINICAL DATA:  Neck trauma (Age >= 65y); Head trauma, moderate-severe EXAM: CT HEAD WITHOUT CONTRAST CT CERVICAL SPINE WITHOUT CONTRAST TECHNIQUE: Multidetector CT imaging of the head  and cervical spine was performed following the standard protocol without intravenous contrast. Multiplanar CT image reconstructions of the cervical spine were also generated. RADIATION DOSE REDUCTION: This exam was performed according to the departmental dose-optimization program which includes automated exposure control, adjustment of the mA and/or kV according to patient size and/or use of iterative reconstruction technique. COMPARISON:  None Available. FINDINGS: CT HEAD FINDINGS Brain: No evidence of acute infarction, hemorrhage, hydrocephalus, extra-axial collection or mass lesion/mass effect. Cerebral atrophy. Vascular: Calcific intracranial atherosclerosis. Skull: No acute fracture. Sinuses/Orbits: Clear sinuses.  No acute orbital findings. Other: No mastoid effusions. CT CERVICAL SPINE FINDINGS Alignment: Unchanged alignment. Similar slight anterolisthesis of C7 on T1. Straightening. Skull base and vertebrae: Vertebral body heights are maintained. No evidence of acute fracture. Soft tissues and spinal  canal: No prevertebral fluid or swelling. No visible canal hematoma. Disc levels: Multilevel degenerative change including degenerative disc disease, facet and uncovertebral hypertrophy with varying degrees of neural foraminal stenosis. Upper chest: Visualized lung apices are clear. Other: Extensive calcific atherosclerosis of the right greater than left carotid bifurcations. Subcentimeter thyroid nodules. Further imaging is not required (ref: J Am Coll Radiol. 2015 Feb;12(2): 143-50). IMPRESSION: 1. No evidence of acute intracranial abnormality. 2. No evidence of acute fracture or traumatic malalignment in the cervical spine. 3. Extensive calcific atherosclerosis of the right greater than left carotid bifurcations. Potentially hemodynamically significant stenosis on the right. A CT of the neck could further characterize if clinically warranted. Electronically Signed   By: Margaretha Sheffield M.D.   On: 09/08/2021  09:59   CT PELVIS WO CONTRAST  Result Date: 09/08/2021 CLINICAL DATA:  Hip trauma, fracture suspected, xray done EXAM: CT PELVIS WITHOUT CONTRAST TECHNIQUE: Multidetector CT imaging of the pelvis was performed following the standard protocol without intravenous contrast. RADIATION DOSE REDUCTION: This exam was performed according to the departmental dose-optimization program which includes automated exposure control, adjustment of the mA and/or kV according to patient size and/or use of iterative reconstruction technique. COMPARISON:  None Available. FINDINGS: Urinary Tract:  Distended bladder. Bowel:  Unremarkable visualized pelvic bowel loops. Vascular/Lymphatic: Aorto bi-iliac atherosclerosis. Reproductive:  No mass or other significant abnormality Musculoskeletal: Potentially acute or subacute impacted left subcapital femoral neck fracture. Approximately half shaft with superior displacement of the distal fracture fragment. Osteopenia. IMPRESSION: 1. Potentially acute or subacute impacted left subcapital femoral neck fracture. Approximately half shaft with superior displacement of the distal fracture fragment. 2. Osteopenia. 3. Aortic atherosclerosis (ICD10-I70.0). Electronically Signed   By: Margaretha Sheffield M.D.   On: 09/08/2021 09:54    Procedures Procedures    Medications Ordered in ED Medications - No data to display  ED Course/ Medical Decision Making/ A&P                           Medical Decision Making Amount and/or Complexity of Data Reviewed Labs: ordered. Decision-making details documented in ED Course. Radiology: ordered and independent interpretation performed. Decision-making details documented in ED Course. ECG/medicine tests: ordered and independent interpretation performed. Decision-making details documented in ED Course.  Risk Decision regarding hospitalization.  Unwitnessed fall with hip and knee pain.  No evidence of head injury.  No blood thinner use  Patient  found to have subcapital femoral neck fracture on the left.  Discussed with Dr. Marlou Sa of orthopedics.  He request patient transferred to [facility] for surgery today. Attempted to call patient's son without answer  CT head and C-spine are negative for acute traumatic injury.  Results reviewed and interpreted by me.  X-ray does confirm a left subcapital femoral neck fracture.  Knee x-rays are negative bilaterally.  Labs show stable anemia.  Leukocytosis likely stress-induced.  Creatinine slightly worse at 2.4.  Will hydrate gently.  Patient's son updated by phone.  He states she had a large MI about 3 or 4 years ago with a reduced ejection fraction which recovered.  She has a cardiologist at [facility].  He was told she was a poor candidate for surgery in the past.  Discussed with Dr. Olevia Bowens he will admit the patient to [facility] and consult cardiology for preoperative clearance as necessary.       Final Clinical Impression(s) / ED Diagnoses Final diagnoses:  Fall, initial encounter  Closed left hip  fracture, initial encounter [facility])    Rx / DC Orders ED Discharge Orders     None         Dahir Ayer, Annie Main, MD 09/08/21 1227

## 2021-09-09 ENCOUNTER — Inpatient Hospital Stay (HOSPITAL_COMMUNITY): Payer: Medicare Other

## 2021-09-09 ENCOUNTER — Encounter (HOSPITAL_COMMUNITY): Payer: Self-pay | Admitting: Internal Medicine

## 2021-09-09 ENCOUNTER — Other Ambulatory Visit: Payer: Self-pay

## 2021-09-09 ENCOUNTER — Inpatient Hospital Stay (HOSPITAL_COMMUNITY): Payer: Medicare Other | Admitting: Anesthesiology

## 2021-09-09 ENCOUNTER — Encounter (HOSPITAL_COMMUNITY)
Admission: EM | Disposition: A | Discharge status: Skilled Nursing Facility | Payer: Self-pay | Source: Skilled Nursing Facility | Attending: Family Medicine

## 2021-09-09 DIAGNOSIS — Z87891 Personal history of nicotine dependence: Secondary | ICD-10-CM

## 2021-09-09 DIAGNOSIS — I110 Hypertensive heart disease with heart failure: Secondary | ICD-10-CM | POA: Diagnosis not present

## 2021-09-09 DIAGNOSIS — I509 Heart failure, unspecified: Secondary | ICD-10-CM | POA: Diagnosis not present

## 2021-09-09 DIAGNOSIS — I252 Old myocardial infarction: Secondary | ICD-10-CM

## 2021-09-09 DIAGNOSIS — S72002A Fracture of unspecified part of neck of left femur, initial encounter for closed fracture: Secondary | ICD-10-CM | POA: Diagnosis not present

## 2021-09-09 HISTORY — PX: TOTAL HIP ARTHROPLASTY: SHX124

## 2021-09-09 LAB — CREATININE, SERUM
Creatinine, Ser: 2.38 mg/dL — ABNORMAL HIGH (ref 0.44–1.00)
GFR, Estimated: 19 mL/min — ABNORMAL LOW (ref >60)

## 2021-09-09 LAB — CBC
HCT: 29.2 % — ABNORMAL LOW (ref 36.0–46.0)
HCT: 30.4 % — ABNORMAL LOW (ref 36.0–46.0)
Hemoglobin: 9.5 g/dL — ABNORMAL LOW (ref 12.0–15.0)
Hemoglobin: 9.5 g/dL — ABNORMAL LOW (ref 12.0–15.0)
MCH: 32.4 pg (ref 26.0–34.0)
MCH: 31.1 pg (ref 26.0–34.0)
MCHC: 32.5 g/dL (ref 30.0–36.0)
MCHC: 31.3 g/dL (ref 30.0–36.0)
MCV: 99.7 fL (ref 80.0–100.0)
MCV: 99.7 fL (ref 80.0–100.0)
Platelets: 232 10*3/uL (ref 150–400)
Platelets: 268 10*3/uL (ref 150–400)
RBC: 2.93 MIL/uL — ABNORMAL LOW (ref 3.87–5.11)
RBC: 3.05 MIL/uL — ABNORMAL LOW (ref 3.87–5.11)
RDW: 15.4 % (ref 11.5–15.5)
RDW: 15 % (ref 11.5–15.5)
WBC: 11.1 10*3/uL — ABNORMAL HIGH (ref 4.0–10.5)
WBC: 14.4 10*3/uL — ABNORMAL HIGH (ref 4.0–10.5)
nRBC: 0 % (ref 0.0–0.2)
nRBC: 0 % (ref 0.0–0.2)

## 2021-09-09 LAB — TSH: TSH: 1.202 u[IU]/mL (ref 0.350–4.500)

## 2021-09-09 LAB — COMPREHENSIVE METABOLIC PANEL
ALT: 16 U/L (ref 0–44)
AST: 23 U/L (ref 15–41)
Albumin: 2.6 g/dL — ABNORMAL LOW (ref 3.5–5.0)
Alkaline Phosphatase: 42 U/L (ref 38–126)
Anion gap: 12 (ref 5–15)
BUN: 49 mg/dL — ABNORMAL HIGH (ref 8–23)
CO2: 21 mmol/L — ABNORMAL LOW (ref 22–32)
Calcium: 8.4 mg/dL — ABNORMAL LOW (ref 8.9–10.3)
Chloride: 111 mmol/L (ref 98–111)
Creatinine, Ser: 2.13 mg/dL — ABNORMAL HIGH (ref 0.44–1.00)
GFR, Estimated: 21 mL/min — ABNORMAL LOW (ref >60)
Glucose, Bld: 80 mg/dL (ref 70–99)
Potassium: 3.6 mmol/L (ref 3.5–5.1)
Sodium: 144 mmol/L (ref 135–145)
Total Bilirubin: 0.9 mg/dL (ref 0.3–1.2)
Total Protein: 6.2 g/dL — ABNORMAL LOW (ref 6.5–8.1)

## 2021-09-09 LAB — SURGICAL PCR SCREEN
MRSA, PCR: NEGATIVE
Staphylococcus aureus: POSITIVE — AB

## 2021-09-09 SURGERY — TOTAL HIP ARTHROPLASTY
Anesthesia: Spinal | Site: Hip | Laterality: Left

## 2021-09-09 MED ORDER — ALPRAZOLAM 0.25 MG PO TABS
0.2500 mg | ORAL_TABLET | Freq: Two times a day (BID) | ORAL | Status: DC
  Administered 2021-09-09 – 2021-09-18 (×19): 0.25 mg via ORAL
  Filled 2021-09-09 (×19): qty 1

## 2021-09-09 MED ORDER — MUPIROCIN 2 % EX OINT
1.0000 | TOPICAL_OINTMENT | Freq: Two times a day (BID) | CUTANEOUS | Status: AC
  Administered 2021-09-10 – 2021-09-14 (×10): 1 via NASAL
  Filled 2021-09-09 (×5): qty 22

## 2021-09-09 MED ORDER — ROCURONIUM BROMIDE 10 MG/ML (PF) SYRINGE
PREFILLED_SYRINGE | INTRAVENOUS | Status: AC
  Filled 2021-09-09: qty 10

## 2021-09-09 MED ORDER — KETAMINE HCL 50 MG/5ML IJ SOSY
PREFILLED_SYRINGE | INTRAMUSCULAR | Status: AC
  Filled 2021-09-09: qty 5

## 2021-09-09 MED ORDER — 0.9 % SODIUM CHLORIDE (POUR BTL) OPTIME
TOPICAL | Status: DC | PRN
  Administered 2021-09-09: 1000 mL

## 2021-09-09 MED ORDER — FERROUS SULFATE 325 (65 FE) MG PO TABS
325.0000 mg | ORAL_TABLET | Freq: Every day | ORAL | Status: DC
  Administered 2021-09-10 – 2021-09-18 (×9): 325 mg via ORAL
  Filled 2021-09-09 (×9): qty 1

## 2021-09-09 MED ORDER — PHENYLEPHRINE 80 MCG/ML (10ML) SYRINGE FOR IV PUSH (FOR BLOOD PRESSURE SUPPORT)
PREFILLED_SYRINGE | INTRAVENOUS | Status: AC
  Filled 2021-09-09: qty 10

## 2021-09-09 MED ORDER — PANTOPRAZOLE SODIUM 40 MG PO TBEC
40.0000 mg | DELAYED_RELEASE_TABLET | Freq: Every day | ORAL | Status: DC
Start: 2021-09-09 — End: 2021-09-16
  Administered 2021-09-10 – 2021-09-16 (×7): 40 mg via ORAL
  Filled 2021-09-09 (×7): qty 1

## 2021-09-09 MED ORDER — LACTATED RINGERS IV SOLN: INTRAVENOUS | Status: DC | PRN

## 2021-09-09 MED ORDER — MAGNESIUM OXIDE -MG SUPPLEMENT 400 (240 MG) MG PO TABS
400.0000 mg | ORAL_TABLET | Freq: Two times a day (BID) | ORAL | Status: DC
  Administered 2021-09-09 – 2021-09-18 (×18): 400 mg via ORAL
  Filled 2021-09-09 (×18): qty 1

## 2021-09-09 MED ORDER — CEFAZOLIN SODIUM-DEXTROSE 2-4 GM/100ML-% IV SOLN
2.0000 g | Freq: Four times a day (QID) | INTRAVENOUS | Status: AC
  Administered 2021-09-09 – 2021-09-10 (×3): 2 g via INTRAVENOUS
  Filled 2021-09-09 (×3): qty 100

## 2021-09-09 MED ORDER — DOCUSATE SODIUM 100 MG PO CAPS
100.0000 mg | ORAL_CAPSULE | Freq: Every day | ORAL | Status: DC
  Administered 2021-09-09: 100 mg via ORAL
  Filled 2021-09-09: qty 1

## 2021-09-09 MED ORDER — POVIDONE-IODINE 10 % EX SWAB: 2.0000 | Freq: Once | CUTANEOUS | Status: DC

## 2021-09-09 MED ORDER — TRANEXAMIC ACID 1000 MG/10ML IV SOLN
INTRAVENOUS | Status: DC | PRN
  Administered 2021-09-09: 2000 mg via TOPICAL

## 2021-09-09 MED ORDER — METHOCARBAMOL 500 MG PO TABS
500.0000 mg | ORAL_TABLET | Freq: Four times a day (QID) | ORAL | Status: DC | PRN
  Administered 2021-09-11 – 2021-09-15 (×2): 500 mg via ORAL
  Filled 2021-09-09 (×2): qty 1

## 2021-09-09 MED ORDER — LIDOCAINE 2% (20 MG/ML) 5 ML SYRINGE
INTRAMUSCULAR | Status: AC
  Filled 2021-09-09: qty 5

## 2021-09-09 MED ORDER — SODIUM CHLORIDE 0.9 % IV SOLN: INTRAVENOUS | Status: DC

## 2021-09-09 MED ORDER — TRANEXAMIC ACID 1000 MG/10ML IV SOLN
2000.0000 mg | Freq: Once | INTRAVENOUS | Status: DC
Start: 2021-09-09 — End: 2021-09-18
  Filled 2021-09-09: qty 20

## 2021-09-09 MED ORDER — FUROSEMIDE 40 MG PO TABS: 20.0000 mg | ORAL_TABLET | Freq: Every day | ORAL | Status: DC

## 2021-09-09 MED ORDER — PROPOFOL 10 MG/ML IV BOLUS
INTRAVENOUS | Status: DC | PRN
  Administered 2021-09-09: 20 mg via INTRAVENOUS
  Administered 2021-09-09: 10 mg via INTRAVENOUS

## 2021-09-09 MED ORDER — VANCOMYCIN HCL 1000 MG IV SOLR
INTRAVENOUS | Status: AC
  Filled 2021-09-09: qty 20

## 2021-09-09 MED ORDER — VANCOMYCIN HCL 1 G IV SOLR
INTRAVENOUS | Status: DC | PRN
  Administered 2021-09-09: 1000 mg

## 2021-09-09 MED ORDER — ROSUVASTATIN CALCIUM 5 MG PO TABS
10.0000 mg | ORAL_TABLET | Freq: Every day | ORAL | Status: DC
  Administered 2021-09-09 – 2021-09-17 (×10): 10 mg via ORAL
  Filled 2021-09-09 (×4): qty 2
  Filled 2021-09-09: qty 1
  Filled 2021-09-09 (×6): qty 2

## 2021-09-09 MED ORDER — BUPIVACAINE IN DEXTROSE 0.75-8.25 % IT SOLN
INTRATHECAL | Status: DC | PRN
  Administered 2021-09-09: 1.6 mL via INTRATHECAL

## 2021-09-09 MED ORDER — ACETAMINOPHEN 325 MG PO TABS
325.0000 mg | ORAL_TABLET | Freq: Four times a day (QID) | ORAL | Status: DC | PRN
  Administered 2021-09-13 – 2021-09-17 (×2): 325 mg via ORAL
  Filled 2021-09-09: qty 1

## 2021-09-09 MED ORDER — CALCIUM CARBONATE ANTACID 500 MG PO CHEW
1000.0000 mg | CHEWABLE_TABLET | Freq: Every day | ORAL | Status: DC
  Administered 2021-09-10 – 2021-09-18 (×9): 1000 mg via ORAL
  Filled 2021-09-09 (×9): qty 5

## 2021-09-09 MED ORDER — SORBITOL 70 % SOLN: 30.0000 mL | Freq: Every day | Status: DC | PRN

## 2021-09-09 MED ORDER — EPHEDRINE 5 MG/ML INJ
INTRAVENOUS | Status: AC
  Filled 2021-09-09: qty 5

## 2021-09-09 MED ORDER — DOCUSATE SODIUM 100 MG PO CAPS
100.0000 mg | ORAL_CAPSULE | Freq: Two times a day (BID) | ORAL | Status: DC
  Administered 2021-09-09 – 2021-09-18 (×17): 100 mg via ORAL
  Filled 2021-09-09 (×18): qty 1

## 2021-09-09 MED ORDER — SODIUM CHLORIDE 0.9 % IR SOLN
Status: DC | PRN
  Administered 2021-09-09: 1

## 2021-09-09 MED ORDER — PHENOL 1.4 % MT LIQD
1.0000 | OROMUCOSAL | Status: DC | PRN
Start: 2021-09-09 — End: 2021-09-18

## 2021-09-09 MED ORDER — ACETAMINOPHEN 500 MG PO TABS
ORAL_TABLET | ORAL | Status: AC
  Administered 2021-09-09: 1000 mg via ORAL
  Filled 2021-09-09: qty 2

## 2021-09-09 MED ORDER — SUCCINYLCHOLINE CHLORIDE 200 MG/10ML IV SOSY
PREFILLED_SYRINGE | INTRAVENOUS | Status: AC
Start: 2021-09-09 — End: ?
  Filled 2021-09-09: qty 10

## 2021-09-09 MED ORDER — METHOCARBAMOL 1000 MG/10ML IJ SOLN: 500.0000 mg | Freq: Four times a day (QID) | INTRAVENOUS | Status: DC | PRN

## 2021-09-09 MED ORDER — ESCITALOPRAM OXALATE 10 MG PO TABS
10.0000 mg | ORAL_TABLET | Freq: Every day | ORAL | Status: DC
  Administered 2021-09-10 – 2021-09-18 (×9): 10 mg via ORAL
  Filled 2021-09-09 (×9): qty 1

## 2021-09-09 MED ORDER — ONDANSETRON HCL 4 MG/2ML IJ SOLN: 4.0000 mg | Freq: Once | INTRAMUSCULAR | Status: DC | PRN

## 2021-09-09 MED ORDER — TRANEXAMIC ACID-NACL 1000-0.7 MG/100ML-% IV SOLN
1000.0000 mg | INTRAVENOUS | Status: AC
Start: 2021-09-09 — End: 2021-09-09
  Administered 2021-09-09: 1000 mg via INTRAVENOUS
  Filled 2021-09-09: qty 100

## 2021-09-09 MED ORDER — ONDANSETRON HCL 4 MG/2ML IJ SOLN
INTRAMUSCULAR | Status: AC
  Filled 2021-09-09: qty 2

## 2021-09-09 MED ORDER — MENTHOL 3 MG MT LOZG: 1.0000 | LOZENGE | OROMUCOSAL | Status: DC | PRN

## 2021-09-09 MED ORDER — ORAL CARE MOUTH RINSE: 15.0000 mL | Freq: Once | OROMUCOSAL | Status: AC

## 2021-09-09 MED ORDER — ONDANSETRON HCL 4 MG/2ML IJ SOLN: 4.0000 mg | Freq: Four times a day (QID) | INTRAMUSCULAR | Status: DC | PRN

## 2021-09-09 MED ORDER — BUPIVACAINE-MELOXICAM ER 400-12 MG/14ML IJ SOLN
INTRAMUSCULAR | Status: AC
Start: 2021-09-09 — End: ?
  Filled 2021-09-09: qty 1

## 2021-09-09 MED ORDER — MORPHINE SULFATE (PF) 2 MG/ML IV SOLN: 0.5000 mg | INTRAVENOUS | Status: DC | PRN

## 2021-09-09 MED ORDER — CHLORHEXIDINE GLUCONATE 0.12 % MT SOLN: 15.0000 mL | Freq: Once | OROMUCOSAL | Status: AC

## 2021-09-09 MED ORDER — ACETAMINOPHEN 500 MG PO TABS: 1000.0000 mg | ORAL_TABLET | Freq: Once | ORAL | Status: AC

## 2021-09-09 MED ORDER — HYDROCODONE-ACETAMINOPHEN 5-325 MG PO TABS
1.0000 | ORAL_TABLET | Freq: Four times a day (QID) | ORAL | 0 refills | Status: DC | PRN
Start: 2021-09-09 — End: 2021-09-18

## 2021-09-09 MED ORDER — CHLORHEXIDINE GLUCONATE 4 % EX LIQD: 60.0000 mL | Freq: Once | CUTANEOUS | Status: DC

## 2021-09-09 MED ORDER — FENTANYL CITRATE (PF) 100 MCG/2ML IJ SOLN
INTRAMUSCULAR | Status: AC
  Administered 2021-09-09: 25 ug via INTRAVENOUS
  Filled 2021-09-09: qty 2

## 2021-09-09 MED ORDER — KETAMINE HCL 10 MG/ML IJ SOLN
INTRAMUSCULAR | Status: DC | PRN
  Administered 2021-09-09: 10 mg via INTRAVENOUS

## 2021-09-09 MED ORDER — DEXAMETHASONE SODIUM PHOSPHATE 10 MG/ML IJ SOLN
INTRAMUSCULAR | Status: DC | PRN
  Administered 2021-09-09: 4 mg via INTRAVENOUS

## 2021-09-09 MED ORDER — ONDANSETRON HCL 4 MG/2ML IJ SOLN
INTRAMUSCULAR | Status: DC | PRN
  Administered 2021-09-09: 4 mg via INTRAVENOUS

## 2021-09-09 MED ORDER — ALUM & MAG HYDROXIDE-SIMETH 200-200-20 MG/5ML PO SUSP: 30.0000 mL | ORAL | Status: DC | PRN

## 2021-09-09 MED ORDER — ENOXAPARIN SODIUM 30 MG/0.3ML IJ SOSY
30.0000 mg | PREFILLED_SYRINGE | INTRAMUSCULAR | Status: DC
  Administered 2021-09-10 – 2021-09-18 (×9): 30 mg via SUBCUTANEOUS
  Filled 2021-09-09 (×9): qty 0.3

## 2021-09-09 MED ORDER — ENOXAPARIN SODIUM 40 MG/0.4ML IJ SOSY: 40.0000 mg | PREFILLED_SYRINGE | INTRAMUSCULAR | Status: DC

## 2021-09-09 MED ORDER — DEXAMETHASONE SODIUM PHOSPHATE 10 MG/ML IJ SOLN
INTRAMUSCULAR | Status: AC
  Filled 2021-09-09: qty 1

## 2021-09-09 MED ORDER — FENTANYL CITRATE (PF) 100 MCG/2ML IJ SOLN
25.0000 ug | INTRAMUSCULAR | Status: DC | PRN
  Administered 2021-09-09: 25 ug via INTRAVENOUS
  Administered 2021-09-09: 50 ug via INTRAVENOUS

## 2021-09-09 MED ORDER — TRANEXAMIC ACID-NACL 1000-0.7 MG/100ML-% IV SOLN
1000.0000 mg | Freq: Once | INTRAVENOUS | Status: AC
  Administered 2021-09-09: 1000 mg via INTRAVENOUS
  Filled 2021-09-09: qty 100

## 2021-09-09 MED ORDER — PROPOFOL 500 MG/50ML IV EMUL
INTRAVENOUS | Status: DC | PRN
  Administered 2021-09-09: 30 ug/kg/min via INTRAVENOUS

## 2021-09-09 MED ORDER — ACETAMINOPHEN 10 MG/ML IV SOLN
INTRAVENOUS | Status: AC
  Filled 2021-09-09: qty 100

## 2021-09-09 MED ORDER — ENOXAPARIN SODIUM 40 MG/0.4ML IJ SOSY: 40.0000 mg | PREFILLED_SYRINGE | Freq: Every day | INTRAMUSCULAR | 0 refills | Status: DC

## 2021-09-09 MED ORDER — ACETAMINOPHEN 500 MG PO TABS
500.0000 mg | ORAL_TABLET | Freq: Four times a day (QID) | ORAL | Status: AC
  Administered 2021-09-10 (×3): 500 mg via ORAL
  Filled 2021-09-09 (×3): qty 1

## 2021-09-09 MED ORDER — HYDROCODONE-ACETAMINOPHEN 7.5-325 MG PO TABS
1.0000 | ORAL_TABLET | ORAL | Status: DC | PRN
  Administered 2021-09-11 – 2021-09-12 (×2): 1 via ORAL
  Filled 2021-09-09 (×2): qty 1

## 2021-09-09 MED ORDER — AMLODIPINE BESYLATE 10 MG PO TABS
10.0000 mg | ORAL_TABLET | Freq: Every day | ORAL | Status: DC
Start: 2021-09-09 — End: 2021-09-15
  Administered 2021-09-10 – 2021-09-15 (×6): 10 mg via ORAL
  Filled 2021-09-09 (×5): qty 1

## 2021-09-09 MED ORDER — FENTANYL CITRATE (PF) 250 MCG/5ML IJ SOLN
INTRAMUSCULAR | Status: AC
  Filled 2021-09-09: qty 5

## 2021-09-09 MED ORDER — BUPIVACAINE-MELOXICAM ER 400-12 MG/14ML IJ SOLN
INTRAMUSCULAR | Status: DC | PRN
  Administered 2021-09-09: 400 mg

## 2021-09-09 MED ORDER — POLYETHYLENE GLYCOL 3350 17 G PO PACK: 17.0000 g | PACK | Freq: Every day | ORAL | Status: DC | PRN

## 2021-09-09 MED ORDER — LACTATED RINGERS IV SOLN
INTRAVENOUS | Status: AC
Start: 2021-09-09 — End: 2021-09-10

## 2021-09-09 MED ORDER — CEFAZOLIN SODIUM-DEXTROSE 2-4 GM/100ML-% IV SOLN
2.0000 g | INTRAVENOUS | Status: AC
  Administered 2021-09-09: 2 g via INTRAVENOUS
  Filled 2021-09-09: qty 100

## 2021-09-09 MED ORDER — CHLORHEXIDINE GLUCONATE 0.12 % MT SOLN
OROMUCOSAL | Status: AC
  Administered 2021-09-09: 15 mL via OROMUCOSAL
  Filled 2021-09-09: qty 15

## 2021-09-09 MED ORDER — HYDROCODONE-ACETAMINOPHEN 5-325 MG PO TABS
1.0000 | ORAL_TABLET | ORAL | Status: DC | PRN
  Administered 2021-09-15: 1 via ORAL
  Filled 2021-09-09: qty 1

## 2021-09-09 MED ORDER — CHLORHEXIDINE GLUCONATE CLOTH 2 % EX PADS
6.0000 | MEDICATED_PAD | Freq: Every day | CUTANEOUS | Status: AC
  Administered 2021-09-10 – 2021-09-14 (×4): 6 via TOPICAL

## 2021-09-09 MED ORDER — PHENYLEPHRINE HCL-NACL 20-0.9 MG/250ML-% IV SOLN
INTRAVENOUS | Status: DC | PRN
  Administered 2021-09-09: 20 ug/min via INTRAVENOUS

## 2021-09-09 MED ORDER — CHLORHEXIDINE GLUCONATE CLOTH 2 % EX PADS: 6.0000 | MEDICATED_PAD | Freq: Every day | CUTANEOUS | Status: DC

## 2021-09-09 MED ORDER — ENSURE ENLIVE PO LIQD
237.0000 mL | Freq: Two times a day (BID) | ORAL | Status: DC
Start: 2021-09-09 — End: 2021-09-18
  Administered 2021-09-10 – 2021-09-16 (×13): 237 mL via ORAL
  Filled 2021-09-09 (×2): qty 237

## 2021-09-09 MED ORDER — FOLIC ACID 1 MG PO TABS
1.0000 mg | ORAL_TABLET | ORAL | Status: DC
  Administered 2021-09-11 – 2021-09-17 (×4): 1 mg via ORAL
  Filled 2021-09-09 (×7): qty 1

## 2021-09-09 MED ORDER — PRONTOSAN WOUND IRRIGATION OPTIME
TOPICAL | Status: DC | PRN
  Administered 2021-09-09: 1

## 2021-09-09 MED ORDER — PROPOFOL 10 MG/ML IV BOLUS
INTRAVENOUS | Status: AC
  Filled 2021-09-09: qty 20

## 2021-09-09 MED ORDER — ONDANSETRON HCL 4 MG PO TABS: 4.0000 mg | ORAL_TABLET | Freq: Four times a day (QID) | ORAL | Status: DC | PRN

## 2021-09-09 SURGICAL SUPPLY — 60 items
BAG COUNTER SPONGE SURGICOUNT (BAG) ×2
BAG DECANTER FOR FLEXI CONT (MISCELLANEOUS) ×2
BIPOLAR PROS AML 45 (Hips) ×2 IMPLANT
CELLS DAT CNTRL 66122 CELL SVR (MISCELLANEOUS) IMPLANT
COVER PERINEAL POST (MISCELLANEOUS) ×2
COVER SURGICAL LIGHT HANDLE (MISCELLANEOUS) ×2
DERMABOND ADVANCED (GAUZE/BANDAGES/DRESSINGS) ×1
DERMABOND ADVANCED .7 DNX12 (GAUZE/BANDAGES/DRESSINGS) ×1
DRAPE C-ARM 42X72 X-RAY (DRAPES) ×2
DRAPE POUCH INSTRU U-SHP 10X18 (DRAPES) ×2
DRAPE STERI IOBAN 125X83 (DRAPES) ×2
DRAPE U-SHAPE 47X51 STRL (DRAPES) ×4
DRSG AQUACEL AG ADV 3.5X10 (GAUZE/BANDAGES/DRESSINGS) ×2
DURAPREP 26ML APPLICATOR (WOUND CARE) ×4
ELECT BLADE 4.0 EZ CLEAN MEGAD (MISCELLANEOUS) ×2
ELECT REM PT RETURN 9FT ADLT (ELECTROSURGICAL) ×2
GLOVE BIOGEL PI IND STRL 7.0 (GLOVE) ×1
GLOVE BIOGEL PI IND STRL 7.5 (GLOVE) ×4
GLOVE BIOGEL PI INDICATOR 7.0 (GLOVE) ×1
GLOVE BIOGEL PI INDICATOR 7.5 (GLOVE) ×4
GLOVE ECLIPSE 7.0 STRL STRAW (GLOVE) ×4
GLOVE SKINSENSE NS SZ7.5 (GLOVE) ×1
GLOVE SKINSENSE STRL SZ7.5 (GLOVE) ×1
GLOVE SURG UNDER POLY LF SZ7 (GLOVE) ×2
GLOVE SURG UNDER POLY LF SZ7.5 (GLOVE) ×4
GOWN STRL REIN XL XLG (GOWN DISPOSABLE) ×2
GOWN STRL REUS W/ TWL LRG LVL3 (GOWN DISPOSABLE)
GOWN STRL REUS W/ TWL XL LVL3 (GOWN DISPOSABLE) ×1
GOWN STRL REUS W/TWL LRG LVL3 (GOWN DISPOSABLE)
GOWN STRL REUS W/TWL XL LVL3 (GOWN DISPOSABLE) ×1
HANDPIECE INTERPULSE COAX TIP (DISPOSABLE) ×1
HIP BALL ARTICU 28 +5 (Hips) ×2 IMPLANT
HOOD PEEL AWAY FLYTE STAYCOOL (MISCELLANEOUS) ×8
IV NS IRRIG 3000ML ARTHROMATIC (IV SOLUTION) ×2
JET LAVAGE IRRISEPT WOUND (IRRIGATION / IRRIGATOR) ×2
KIT BASIN OR (CUSTOM PROCEDURE TRAY) ×2
MARKER SKIN DUAL TIP RULER LAB (MISCELLANEOUS) ×2
NDL SPNL 18GX3.5 QUINCKE PK (NEEDLE) ×1 IMPLANT
NEEDLE SPNL 18GX3.5 QUINCKE PK (NEEDLE) ×2
PACK TOTAL JOINT (CUSTOM PROCEDURE TRAY) ×2
PACK UNIVERSAL I (CUSTOM PROCEDURE TRAY) ×2
RTRCTR WOUND ALEXIS 18CM MED (MISCELLANEOUS)
SAW OSC TIP CART 19.5X105X1.3 (SAW) ×2
SET HNDPC FAN SPRY TIP SCT (DISPOSABLE) ×1
STAPLER VISISTAT 35W (STAPLE)
STEM SUMMIT PRESSFIT SZ 6 (Hips) ×2 IMPLANT
SUT ETHIBOND 2 V 37 (SUTURE) ×2
SUT ETHILON 2 0 PSLX (SUTURE) ×2
SUT VIC AB 0 CT1 27 (SUTURE) ×1
SUT VIC AB 0 CT1 27XBRD ANBCTR (SUTURE) ×1
SUT VIC AB 1 CTX 36 (SUTURE) ×1
SUT VIC AB 1 CTX36XBRD ANBCTR (SUTURE) ×1
SUT VIC AB 2-0 CT1 27 (SUTURE) ×3
SUT VIC AB 2-0 CT1 TAPERPNT 27 (SUTURE) ×3
SYR 50ML LL SCALE MARK (SYRINGE) ×2
TOWEL GREEN STERILE (TOWEL DISPOSABLE) ×2
TRAY CATH 16FR W/PLASTIC CATH (SET/KITS/TRAYS/PACK)
TRAY FOLEY W/BAG SLVR 16FR (SET/KITS/TRAYS/PACK) ×1
TRAY FOLEY W/BAG SLVR 16FR ST (SET/KITS/TRAYS/PACK) ×1
YANKAUER SUCT BULB TIP NO VENT (SUCTIONS) ×2

## 2021-09-09 NOTE — Consult Note (Signed)
Reason for Consult:Left hip fx Referring Physician: Niel Hummer Time called: [id number] Time at bedside: Pendleton is an 88 y.o. female.  HPI: Delores suffered an unwitnessed fall at the memory care unit where she resides. She was brought to the ED where x-rays showed a left hip fx. She was transferred to [facility] for definitive care. She is demented and cannot contribute to history.  Past Medical History:  Diagnosis Date   Bradycardia    CHF (congestive heart failure) (HCC)    Closed right hip fracture, sequela 02/10/2021   COVID 12/06/2020   Emphysema, unspecified ([place])    Hematemesis 12/05/2020   History of MI (myocardial infarction)    Hypertension     Past Surgical History:  Procedure Laterality Date   HIP ARTHROPLASTY Right     Family History  Problem Relation Age of Onset   Hypertension Other     Social History:  reports that she has quit smoking. Her smoking use included cigarettes. She has never been exposed to tobacco smoke. She has never used smokeless tobacco. She reports that she does not currently use alcohol. She reports that she does not use drugs.  Allergies: No Known Allergies  Medications: I have reviewed the patient's current medications.  Results for orders placed or performed during the hospital encounter of 09/08/21 (from the past 48 hour(s))  CBC with Differential     Status: Abnormal   Collection Time: 09/08/21 11:06 AM  Result Value Ref Range   WBC 14.0 (H) 4.0 - 10.5 K/uL   RBC 3.31 (L) 3.87 - 5.11 MIL/uL   Hemoglobin 10.5 (L) 12.0 - 15.0 g/dL   HCT 32.6 (L) 36.0 - 46.0 %   MCV 98.5 80.0 - 100.0 fL   MCH 31.7 26.0 - 34.0 pg   MCHC 32.2 30.0 - 36.0 g/dL   RDW 15.3 11.5 - 15.5 %   Platelets 240 150 - 400 K/uL   nRBC 0.0 0.0 - 0.2 %   Neutrophils Relative % 82 %   Neutro Abs 11.4 (H) 1.7 - 7.7 K/uL   Lymphocytes Relative 5 %   Lymphs Abs 0.8 0.7 - 4.0 K/uL   Monocytes Relative 12 %   Monocytes Absolute 1.7 (H) 0.1 - 1.0 K/uL    Eosinophils Relative 1 %   Eosinophils Absolute 0.1 0.0 - 0.5 K/uL   Basophils Relative 0 %   Basophils Absolute 0.0 0.0 - 0.1 K/uL   Immature Granulocytes 0 %   Abs Immature Granulocytes 0.05 0.00 - 0.07 K/uL    Comment: Performed at [facility], [place] [address], [place], [place] [address]  Basic metabolic panel     Status: Abnormal   Collection Time: 09/08/21 11:06 AM  Result Value Ref Range   Sodium 139 135 - 145 mmol/L   Potassium 3.9 3.5 - 5.1 mmol/L   Chloride 106 98 - 111 mmol/L   CO2 23 22 - 32 mmol/L   Glucose, Bld 121 (H) 70 - 99 mg/dL    Comment: Glucose reference range applies only to samples taken after fasting for at least 8 hours.   BUN 53 (H) 8 - 23 mg/dL   Creatinine, Ser 2.44 (H) 0.44 - 1.00 mg/dL   Calcium 9.0 8.9 - 10.3 mg/dL   GFR, Estimated 18 (L) >60 mL/min    Comment: (NOTE) Calculated using the CKD-EPI Creatinine Equation (2021)    Anion gap 10 5 - 15    Comment: Performed at [facility], [place] [address], [place], [place] [address]  Urinalysis, Routine w reflex microscopic     Status: Abnormal   Collection Time: 09/08/21  9:32 PM  Result Value Ref Range   Color, Urine YELLOW YELLOW   APPearance HAZY (A) CLEAR   Specific Gravity, Urine 1.012 1.005 - 1.030   pH 6.0 5.0 - 8.0   Glucose, UA NEGATIVE NEGATIVE mg/dL   Hgb urine dipstick SMALL (A) NEGATIVE   Bilirubin Urine NEGATIVE NEGATIVE   Ketones, ur NEGATIVE NEGATIVE mg/dL   Protein, ur 100 (A) NEGATIVE mg/dL   Nitrite NEGATIVE NEGATIVE   Leukocytes,Ua NEGATIVE NEGATIVE   RBC / HPF 0-5 0 - 5 RBC/hpf   WBC, UA 0-5 0 - 5 WBC/hpf   Bacteria, UA MANY (A) NONE SEEN   Squamous Epithelial / LPF 0-5 0 - 5   Mucus PRESENT     Comment: Performed at [facility], [place] [address] [place], [place] [address]  CBC     Status: Abnormal   Collection Time: 09/09/21  4:35 AM  Result Value Ref Range   WBC 11.1 (H) 4.0 - 10.5 K/uL   RBC 2.93 (L) 3.87 - 5.11  MIL/uL   Hemoglobin 9.5 (L) 12.0 - 15.0 g/dL   HCT 29.2 (L) 36.0 - 46.0 %   MCV 99.7 80.0 - 100.0 fL   MCH 32.4 26.0 - 34.0 pg   MCHC 32.5 30.0 - 36.0 g/dL   RDW 15.4 11.5 - 15.5 %   Platelets 232 150 - 400 K/uL   nRBC 0.0 0.0 - 0.2 %    Comment: Performed at [facility], [place] [address], [place], [place] [address]  Comprehensive metabolic panel     Status: Abnormal   Collection Time: 09/09/21  4:35 AM  Result Value Ref Range   Sodium 144 135 - 145 mmol/L   Potassium 3.6 3.5 - 5.1 mmol/L   Chloride 111 98 - 111 mmol/L   CO2 21 (L) 22 - 32 mmol/L   Glucose, Bld 80 70 - 99 mg/dL    Comment: Glucose reference range applies only to samples taken after fasting for at least 8 hours.   BUN 49 (H) 8 - 23 mg/dL   Creatinine, Ser 2.13 (H) 0.44 - 1.00 mg/dL   Calcium 8.4 (L) 8.9 - 10.3 mg/dL   Total Protein 6.2 (L) 6.5 - 8.1 g/dL   Albumin 2.6 (L) 3.5 - 5.0 g/dL   AST 23 15 - 41 U/L   ALT 16 0 - 44 U/L   Alkaline Phosphatase 42 38 - 126 U/L   Total Bilirubin 0.9 0.3 - 1.2 mg/dL   GFR, Estimated 21 (L) >60 mL/min    Comment: (NOTE) Calculated using the CKD-EPI Creatinine Equation (2021)    Anion gap 12 5 - 15    Comment: Performed at [facility], [place] [address], [place], [place] [address]  Type and screen [place]     Status: None (Preliminary result)   Collection Time: 09/09/21  9:50 AM  Result Value Ref Range   ABO/RH(D) PENDING    Antibody Screen PENDING    Sample Expiration      09/12/2021,2359 Performed at [facility], [place] [address], [place], [place] [address]     VAS US CAROTID  Result Date: 09/08/2021 Carotid Arterial Duplex Study Patient Name:  LYNDEL DANCEL  Date of Exam:   09/08/2021 Medical Rec #: [id number]  Accession #:    [id number] Date of Birth: 11/27/1928       Patient Gender: F Patient Age:   20 years Exam Location:  [facility] Procedure:      VAS US CAROTID Referring Phys: DAVID ORTIZ  --------------------------------------------------------------------------------  Indications:       Atherosclerosis by CT. Risk Factors:      Hypertension, hyperlipidemia. Other Factors:     Extensive calcific atherosclerosis at the bilateral carotid                    bifurcations by CT, unable to have CTA secondary to CKD.                    Atrial fibrillation. Status post fall resulting in hip                    fracture. Limitations        Today's exam was limited due to tachycardia and arrythmia. Comparison Study:  No prior study on file Performing Technologist: Sharion Dove RVS  Examination Guidelines: A complete evaluation includes B-mode imaging, spectral Doppler, color Doppler, and power Doppler as needed of all accessible portions of each vessel. Bilateral testing is considered an integral part of a complete examination. Limited examinations for reoccurring indications may be performed as noted.  Right Carotid Findings: +----------+--------+--------+--------+------------------+---------+           PSV cm/sEDV cm/sStenosisPlaque DescriptionComments  +----------+--------+--------+--------+------------------+---------+ CCA Prox  47      8               heterogenous                +----------+--------+--------+--------+------------------+---------+ CCA Distal36      8               heterogenous                +----------+--------+--------+--------+------------------+---------+ ICA Prox  111     39              calcific          Shadowing +----------+--------+--------+--------+------------------+---------+ ICA Mid   87      28                                          +----------+--------+--------+--------+------------------+---------+ ICA Distal85      20                                          +----------+--------+--------+--------+------------------+---------+ ECA       67      5                                            +----------+--------+--------+--------+------------------+---------+ +----------+--------+-------+--------+-------------------+           PSV cm/sEDV cmsDescribeArm Pressure (mmHG) +----------+--------+-------+--------+-------------------+ [id number]                                        +----------+--------+-------+--------+-------------------+ +---------+--------+--+--------+-+ VertebralPSV cm/s26EDV cm/s4 +---------+--------+--+--------+-+  Left Carotid Findings: +----------+--------+--------+--------+------------------+---------+  PSV cm/sEDV cm/sStenosisPlaque DescriptionComments  +----------+--------+--------+--------+------------------+---------+ CCA Prox  65      7               heterogenous                +----------+--------+--------+--------+------------------+---------+ CCA Distal61      13              heterogenous                +----------+--------+--------+--------+------------------+---------+ ICA Prox  77      20              calcific          Shadowing +----------+--------+--------+--------+------------------+---------+ ICA Mid   109     26                                          +----------+--------+--------+--------+------------------+---------+ ICA Distal55      13                                          +----------+--------+--------+--------+------------------+---------+ ECA       36      2                                           +----------+--------+--------+--------+------------------+---------+ +----------+--------+--------+--------+-------------------+           PSV cm/sEDV cm/sDescribeArm Pressure (mmHG) +----------+--------+--------+--------+-------------------+ Subclavian80                                          +----------+--------+--------+--------+-------------------+ +---------+--------+--+--------+--+ VertebralPSV cm/s54EDV cm/s12 +---------+--------+--+--------+--+   Summary:  Right Carotid: Velocities in the right ICA are consistent with a 1-39% stenosis. Left Carotid: Velocities in the left ICA are consistent with a 1-39% stenosis. Vertebrals:  Bilateral vertebral arteries demonstrate antegrade flow. Subclavians: Normal flow hemodynamics were seen in bilateral subclavian              arteries. *See table(s) above for measurements and observations.  Electronically signed by Orlie Pollen on 09/08/2021 at 4:48:43 PM.    Final    DG Chest 2 View  Result Date: 09/08/2021 CLINICAL DATA:  Fall, dementia, left hip fracture EXAM: CHEST - 2 VIEW COMPARISON:  02/10/2021 FINDINGS: Similar cardiomegaly and central vascular congestion. Negative for CHF or definite pneumonia. Minor basilar atelectasis. No effusion or pneumothorax. Trachea midline. Aorta atherosclerotic. No acute osseous finding. Degenerative changes throughout the spine. IMPRESSION: Cardiomegaly without acute process Aortic Atherosclerosis (ICD10-I70.0). Electronically Signed   By: Jerilynn Mages.  Shick M.D.   On: 09/08/2021 10:23   DG Knee Complete 4 Views Right  Result Date: 09/08/2021 CLINICAL DATA:  Fall, left hip fracture EXAM: RIGHT KNEE - COMPLETE 4+ VIEW COMPARISON:  09/08/2021 FINDINGS: Bones are osteopenic. Diffuse right knee degenerative changes and chondrocalcinosis. No acute osseous finding, fracture, malalignment or effusion. Peripheral atherosclerosis. No focal soft tissue abnormality. IMPRESSION: Osteopenia and degenerative changes. No acute finding by plain radiography Electronically Signed   By: Jerilynn Mages.  Shick M.D.   On: 09/08/2021 10:21   DG Hip Unilat W or Wo Pelvis 2-3 Views  Left  Result Date: 09/08/2021 CLINICAL DATA:  Unwitnessed fall, pain EXAM: DG HIP (WITH OR WITHOUT PELVIS) 2-3V LEFT COMPARISON:  09/08/2021 FINDINGS: There is an acute displaced and mildly impacted left hip subcapital femoral neck fracture noted. Bones are osteopenic. Pelvis and right hip appear intact. Peripheral atherosclerosis noted.  IMPRESSION: Acute displaced left hip subcapital femoral neck fracture. Electronically Signed   By: Jerilynn Mages.  Shick M.D.   On: 09/08/2021 10:20   DG Knee Complete 4 Views Left  Result Date: 09/08/2021 CLINICAL DATA:  Unwitnessed fall, dementia EXAM: LEFT KNEE - COMPLETE 4+ VIEW COMPARISON:  09/08/2021 FINDINGS: Bones are osteopenic. Degenerative changes throughout the knee and chondrocalcinosis. No acute osseous finding, fracture, malalignment, or effusion. Patella located. No soft tissue abnormality. Peripheral vascular calcifications present. IMPRESSION: Osteopenia and degenerative changes as above. No acute osseous finding. Peripheral atherosclerosis Electronically Signed   By: Jerilynn Mages.  Shick M.D.   On: 09/08/2021 10:17   CT Head Wo Contrast  Result Date: 09/08/2021 CLINICAL DATA:  Neck trauma (Age >= 65y); Head trauma, moderate-severe EXAM: CT HEAD WITHOUT CONTRAST CT CERVICAL SPINE WITHOUT CONTRAST TECHNIQUE: Multidetector CT imaging of the head and cervical spine was performed following the standard protocol without intravenous contrast. Multiplanar CT image reconstructions of the cervical spine were also generated. RADIATION DOSE REDUCTION: This exam was performed according to the departmental dose-optimization program which includes automated exposure control, adjustment of the mA and/or kV according to patient size and/or use of iterative reconstruction technique. COMPARISON:  None Available. FINDINGS: CT HEAD FINDINGS Brain: No evidence of acute infarction, hemorrhage, hydrocephalus, extra-axial collection or mass lesion/mass effect. Cerebral atrophy. Vascular: Calcific intracranial atherosclerosis. Skull: No acute fracture. Sinuses/Orbits: Clear sinuses.  No acute orbital findings. Other: No mastoid effusions. CT CERVICAL SPINE FINDINGS Alignment: Unchanged alignment. Similar slight anterolisthesis of C7 on T1. Straightening. Skull base and vertebrae: Vertebral body heights are maintained. No evidence of  acute fracture. Soft tissues and spinal canal: No prevertebral fluid or swelling. No visible canal hematoma. Disc levels: Multilevel degenerative change including degenerative disc disease, facet and uncovertebral hypertrophy with varying degrees of neural foraminal stenosis. Upper chest: Visualized lung apices are clear. Other: Extensive calcific atherosclerosis of the right greater than left carotid bifurcations. Subcentimeter thyroid nodules. Further imaging is not required (ref: J Am Coll Radiol. 2015 Feb;12(2): 143-50). IMPRESSION: 1. No evidence of acute intracranial abnormality. 2. No evidence of acute fracture or traumatic malalignment in the cervical spine. 3. Extensive calcific atherosclerosis of the right greater than left carotid bifurcations. Potentially hemodynamically significant stenosis on the right. A CT of the neck could further characterize if clinically warranted. Electronically Signed   By: Margaretha Sheffield M.D.   On: 09/08/2021 09:59   CT Cervical Spine Wo Contrast  Result Date: 09/08/2021 CLINICAL DATA:  Neck trauma (Age >= 65y); Head trauma, moderate-severe EXAM: CT HEAD WITHOUT CONTRAST CT CERVICAL SPINE WITHOUT CONTRAST TECHNIQUE: Multidetector CT imaging of the head and cervical spine was performed following the standard protocol without intravenous contrast. Multiplanar CT image reconstructions of the cervical spine were also generated. RADIATION DOSE REDUCTION: This exam was performed according to the departmental dose-optimization program which includes automated exposure control, adjustment of the mA and/or kV according to patient size and/or use of iterative reconstruction technique. COMPARISON:  None Available. FINDINGS: CT HEAD FINDINGS Brain: No evidence of acute infarction, hemorrhage, hydrocephalus, extra-axial collection or mass lesion/mass effect. Cerebral atrophy. Vascular: Calcific intracranial atherosclerosis. Skull: No acute fracture. Sinuses/Orbits: Clear sinuses.   No acute orbital findings. Other:  No mastoid effusions. CT CERVICAL SPINE FINDINGS Alignment: Unchanged alignment. Similar slight anterolisthesis of C7 on T1. Straightening. Skull base and vertebrae: Vertebral body heights are maintained. No evidence of acute fracture. Soft tissues and spinal canal: No prevertebral fluid or swelling. No visible canal hematoma. Disc levels: Multilevel degenerative change including degenerative disc disease, facet and uncovertebral hypertrophy with varying degrees of neural foraminal stenosis. Upper chest: Visualized lung apices are clear. Other: Extensive calcific atherosclerosis of the right greater than left carotid bifurcations. Subcentimeter thyroid nodules. Further imaging is not required (ref: J Am Coll Radiol. 2015 Feb;12(2): 143-50). IMPRESSION: 1. No evidence of acute intracranial abnormality. 2. No evidence of acute fracture or traumatic malalignment in the cervical spine. 3. Extensive calcific atherosclerosis of the right greater than left carotid bifurcations. Potentially hemodynamically significant stenosis on the right. A CT of the neck could further characterize if clinically warranted. Electronically Signed   By: Margaretha Sheffield M.D.   On: 09/08/2021 09:59   CT PELVIS WO CONTRAST  Result Date: 09/08/2021 CLINICAL DATA:  Hip trauma, fracture suspected, xray done EXAM: CT PELVIS WITHOUT CONTRAST TECHNIQUE: Multidetector CT imaging of the pelvis was performed following the standard protocol without intravenous contrast. RADIATION DOSE REDUCTION: This exam was performed according to the departmental dose-optimization program which includes automated exposure control, adjustment of the mA and/or kV according to patient size and/or use of iterative reconstruction technique. COMPARISON:  None Available. FINDINGS: Urinary Tract:  Distended bladder. Bowel:  Unremarkable visualized pelvic bowel loops. Vascular/Lymphatic: Aorto bi-iliac atherosclerosis. Reproductive:  No  mass or other significant abnormality Musculoskeletal: Potentially acute or subacute impacted left subcapital femoral neck fracture. Approximately half shaft with superior displacement of the distal fracture fragment. Osteopenia. IMPRESSION: 1. Potentially acute or subacute impacted left subcapital femoral neck fracture. Approximately half shaft with superior displacement of the distal fracture fragment. 2. Osteopenia. 3. Aortic atherosclerosis (ICD10-I70.0). Electronically Signed   By: Margaretha Sheffield M.D.   On: 09/08/2021 09:54    Review of Systems  Unable to perform ROS: Dementia   Blood pressure (!) 161/85, pulse 74, temperature 98.2 F (36.8 C), temperature source Oral, resp. rate 18, height 5\' 5"  (1.651 m), SpO2 94 %. Physical Exam Constitutional:      General: She is not in acute distress.    Appearance: She is well-developed. She is not diaphoretic.  HENT:     Head: Normocephalic and atraumatic.  Eyes:     General: No scleral icterus.       Right eye: No discharge.        Left eye: No discharge.     Conjunctiva/sclera: Conjunctivae normal.  Cardiovascular:     Rate and Rhythm: Normal rate and regular rhythm.  Pulmonary:     Effort: Pulmonary effort is normal. No respiratory distress.  Musculoskeletal:     Cervical back: Normal range of motion.     Comments: LLE No traumatic wounds, ecchymosis, or rash  Nontender  No knee or ankle effusion  Knee stable to varus/ valgus and anterior/posterior stress  Sens DPN, SPN, TN could not assess  Motor EHL, ext, flex, evers could not assess  DP 1+, PT 0, No significant edema  Skin:    General: Skin is warm and dry.  Neurological:     Mental Status: She is alert.  Psychiatric:        Mood and Affect: Mood normal.        Behavior: Behavior normal.     Assessment/Plan: Left hip fx -- Plan hip  hemi today with Dr. Erlinda Hong. Please keep NPO.    Lisette Abu, PA-C Orthopedic Surgery [phone] 09/09/2021, 10:39 AM

## 2021-09-09 NOTE — Progress Notes (Signed)
Pt has a history of dementia is not able to answer admission questions.  Pt's family was at bedside for a few minutes and left without notifying staff.

## 2021-09-09 NOTE — Anesthesia Procedure Notes (Signed)
Procedure Name: MAC Date/Time: 09/09/2021 11:31 AM  Performed by: Janene Harvey, CRNAPre-anesthesia Checklist: Patient identified, Emergency Drugs available, Suction available and Patient being monitored Patient Re-evaluated:Patient Re-evaluated prior to induction Oxygen Delivery Method: Simple face mask Induction Type: IV induction Placement Confirmation: positive ETCO2 Dental Injury: Teeth and Oropharynx as per pre-operative assessment

## 2021-09-09 NOTE — Anesthesia Preprocedure Evaluation (Addendum)
Anesthesia Evaluation  Patient identified by MRN, date of birth, ID band Patient awake and Patient confused    Reviewed: Allergy & Precautions, NPO status , Patient's Chart, lab work & pertinent test results  Airway Mallampati: II  TM Distance: >3 FB Neck ROM: Full    Dental  (+) Dental Advisory Given, Poor Dentition   Pulmonary COPD, former smoker,    Pulmonary exam normal breath sounds clear to auscultation       Cardiovascular hypertension, + Past MI and +CHF  Normal cardiovascular exam Rhythm:Regular Rate:Normal  Echo 03/03/21: 1. Left ventricular ejection fraction, by estimation, is 55 to 60%. The left ventricle has normal function. The left ventricle has no regional wall motion abnormalities. There is mild left ventricular hypertrophy. Left ventricular diastolic parameters are consistent with Grade I diastolic dysfunction (impaired relaxation).  2. Right ventricular systolic function is normal. The right ventricular size is normal. There is normal pulmonary artery systolic pressure. The estimated right ventricular systolic pressure is 86.7 mmHg.  3. Left atrial size was mildly dilated.  4. The mitral valve is abnormal. Trivial mitral valve regurgitation.  5. The aortic valve is tricuspid. Aortic valve regurgitation is not visualized. Aortic valve sclerosis is present, with no evidence of aortic valve stenosis.  6. The inferior vena cava is normal in size with greater than 50% respiratory variability, suggesting right atrial pressure of 3 mmHg.    Neuro/Psych PSYCHIATRIC DISORDERS Dementia negative neurological ROS     GI/Hepatic Neg liver ROS, GERD  Medicated,  Endo/Other  negative endocrine ROS  Renal/GU Renal InsufficiencyRenal disease     Musculoskeletal negative musculoskeletal ROS (+) Left hip fracture    Abdominal   Peds  Hematology  (+) Blood dyscrasia, anemia , Plt 232k   Anesthesia Other Findings Day  of surgery medications reviewed with the patient.  Reproductive/Obstetrics                             Anesthesia Physical Anesthesia Plan  ASA: 4  Anesthesia Plan: Spinal   Post-op Pain Management: Ofirmev IV (intra-op)*   Induction: Intravenous  PONV Risk Score and Plan: 3 and Dexamethasone, Ondansetron, TIVA and Treatment may vary due to age or medical condition  Airway Management Planned: Natural Airway and Nasal Cannula  Additional Equipment:   Intra-op Plan:   Post-operative Plan:   Informed Consent: I have reviewed the patients History and Physical, chart, labs and discussed the procedure including the risks, benefits and alternatives for the proposed anesthesia with the patient or authorized representative who has indicated his/her understanding and acceptance.   Patient has DNR.  Discussed DNR with power of attorney and Continue DNR.   Dental advisory given and Consent reviewed with POA  Plan Discussed with: CRNA  Anesthesia Plan Comments:       Anesthesia Quick Evaluation

## 2021-09-09 NOTE — Op Note (Signed)
LEFT HIP HEMI ARTHROPLASTY  Procedure Note Tanya Koch   [id number]  Pre-op Diagnosis: Left displaced femoral neck fracture     Post-op Diagnosis: same   Operative Procedures  1. Prosthetic replacement for femoral neck fracture. CPT [phone]  Personnel  Surgeon(s): Tanya Koyanagi, MD  ASSIST: Tanya Rob, PA-C   Anesthesia: spinal  Prosthesis: Depuy Femur: Summit basic 6 Head: 45 mm size: +5 Bearing Type: bipolar  Hip Hemiarthroplasty (Anterior Approach) Op Note:  After informed consent was obtained and the operative extremity marked in the holding area, the patient was brought back to the operating room and placed supine on the HANA table. Next, the operative extremity was prepped and draped in normal sterile fashion. Surgical timeout occurred verifying patient identification, surgical site, surgical procedure and administration of antibiotics.  A modified anterior Smith-Peterson approach to the hip was performed, using the interval between tensor fascia lata and sartorius.  Dissection was carried bluntly down onto the anterior hip capsule. The lateral femoral circumflex vessels were identified and coagulated. A capsulotomy was performed and fracture hematoma was evacuated and the capsular flaps tagged for later repair.  The neck osteotomy was performed below the fracture. The femoral head was removed and found a 45 mm head was the appropriate fit.    We then turned our attention to the femur.  After placing the femoral hook, the leg was taken to externally rotated, extended and adducted position taking care to perform soft tissue releases to allow for adequate mobilization of the femur. Soft tissue was cleared from the shoulder of the greater trochanter and the hook elevator used to improve exposure of the proximal femur. Sequential broaching performed up to a size 6. Trial neck and head were placed. The leg was brought back up to neutral and the construct reduced. The  position and sizing of components, offset and leg lengths were checked using fluoroscopy. Stability of the construct was checked in extension and external rotation without any subluxation or impingement of prosthesis. We dislocated the prosthesis, dropped the leg back into position, removed trial components, and irrigated copiously. The final stem and head was then placed, the leg brought back up, the system reduced and fluoroscopy used to verify positioning.  We irrigated, obtained hemostasis and closed the capsule using #2 ethibond suture.  The fascia was closed with #1 vicryl plus, the deep fat layer was closed with 0 vicryl, the subcutaneous layers closed with 2.0 Vicryl Plus and the skin closed with 2.0 nylon. A sterile dressing was applied. The patient was awakened in the operating room and taken to recovery in stable condition. All sponge, needle, and instrument counts were correct at the end of the case.   Tanya Koch, my PA, was necessary for opening, closing, exposing, retracting, limb positioning and overall facilitation and completion of the surgery.  Position: supine  Complications: see description of procedure.  Time Out: performed   Drains/Packing: none  Estimated blood loss: see anesthesia record  Returned to Recovery Room: in good condition.   Antibiotics: yes   Mechanical VTE (DVT) Prophylaxis: sequential compression devices, TED thigh-high  Chemical VTE (DVT) Prophylaxis: lovenox  Fluid Replacement: Crystalloid: see anesthesia record  Specimens Removed: 1 to pathology   Sponge and Instrument Count Correct? yes   PACU: portable radiograph - low AP   Admission: inpatient status, start PT & OT POD#1  Plan/RTC: Return in 2 weeks for staple removal. Return in 6 weeks to see MD.  Weight Bearing/Load Lower Extremity: full  Hip precautions: none  N. Eduard Roux, MD [facility] 12:42 PM   Implant Name Type Inv. Item Serial No. Manufacturer Lot No. LRB  No. Used Action  BIPOLAR PROS AML 45 - [id number] Hips BIPOLAR PROS AML 45  [employer] [id number] Left 1 Implanted  STEM SUMMIT PRESSFIT SZ 6 - [id number] Hips STEM SUMMIT PRESSFIT SZ 6  [employer] [id number] Left 1 Implanted  HIP BALL ARTICU 28 +5 - [id number] Hips HIP BALL ARTICU 28 +5  [employer] [id number] Left 1 Implanted

## 2021-09-09 NOTE — Transfer of Care (Signed)
Immediate Anesthesia Transfer of Care Note  Patient: Tanya Koch  Procedure(s) Performed: LEFT HIP HEMI ARTHROPLASTY (Left: Hip)  Patient Location: PACU  Anesthesia Type:MAC and Spinal  Level of Consciousness: drowsy  Airway & Oxygen Therapy: Patient Spontanous Breathing and Patient connected to face mask oxygen  Post-op Assessment: Report given to RN and Post -op Vital signs reviewed and stable  Post vital signs: Reviewed and stable  Last Vitals:  Vitals Value Taken Time  BP 126/51 09/09/21 1333  Temp    Pulse 64 09/09/21 1338  Resp 20 09/09/21 1338  SpO2 100 % 09/09/21 1338  Vitals shown include unvalidated device data.  Last Pain:  Vitals:   09/09/21 0935  TempSrc: Oral  PainSc:          Complications: No notable events documented.

## 2021-09-09 NOTE — Anesthesia Procedure Notes (Signed)
Spinal  Patient location during procedure: OR Start time: 09/09/2021 11:33 AM End time: 09/09/2021 11:39 AM Reason for block: surgical anesthesia Staffing Performed: anesthesiologist  Anesthesiologist: Santa Lighter, MD Performed by: Santa Lighter, MD Authorized by: Santa Lighter, MD   Preanesthetic Checklist Completed: patient identified, IV checked, risks and benefits discussed, surgical consent, monitors and equipment checked, pre-op evaluation and timeout performed Spinal Block Patient position: left lateral decubitus Prep: DuraPrep and site prepped and draped Patient monitoring: continuous pulse ox and blood pressure Approach: midline Location: L3-4 Injection technique: single-shot Needle Needle type: Pencan  Needle gauge: 24 G Assessment Events: CSF return Additional Notes Functioning IV was confirmed and monitors were applied. Sterile prep and drape, including hand hygiene, mask and sterile gloves were used. The patient was positioned and the spine was prepped. The skin was anesthetized with lidocaine.  Free flow of clear CSF was obtained prior to injecting local anesthetic into the CSF.  The spinal needle aspirated freely following injection.  The needle was carefully withdrawn.  The patient tolerated the procedure well. Consent was obtained prior to procedure with all questions answered and concerns addressed. Risks including but not limited to bleeding, infection, nerve damage, paralysis, failed block, inadequate analgesia, allergic reaction, high spinal, itching and headache were discussed and the patient wished to proceed.   Hoy Morn, MD

## 2021-09-09 NOTE — Progress Notes (Addendum)
PROGRESS NOTE    Tanya Koch  BHA:[id number] DOB: Aug 25, 1928 DOA: 09/08/2021 PCP: Earmon Phoenix, NP   Brief Narrative: 88 year old with past medical history significant for bradycardia, history of MI, unspecified heart failure, with recovered ejection fraction, COVID-19, emphysema, hypertension,  history of hematemesis, history of right closed right hip fracture December 2022 who had an unwitnessed fall and was found on the floor at her skilled nursing facility the morning of admission with inability to stand up and bear weight on her left lower extremity.  Is not able to provide history due to her dementia.  She was found to have left hip fracture.  Orthopedic was consulted and plan was to transfer patient to [facility] and to proceed with surgery  CT cervical spine and CT head with no acute abnormality but show extensive calcific atherosclerosis of both carotid bifurcation.   Assessment & Plan:   Principal Problem:   Closed left hip fracture, initial encounter ([place]) Active Problems:   Dementia without behavioral disturbance (HCC)   GERD (gastroesophageal reflux disease)   Chronic kidney disease (CKD), stage IV (severe) (HCC)   Hypertension   Emphysema lung (HCC)   Hyperlipidemia   Hypothermia   Normocytic anemia   Leukocytosis   Bilateral carotid artery disease ([place])  1-Closed left hip fracture: Orthopedic has been consulted, plan is for surgery today at [facility].  Delirium precaution DVT prophylaxis per orthopedic  2-Bilateral carotid artery disease by CT: Carotid Doppler: Right and left ICA with 1 to 39% stenosis.  No significant stenosis.  Bilateral vertebral arteries demonstrate anterograde flow.  3-Hypothermia: Chest x-ray was normal. Urinalysis: negative.  Check TSH.  Warm blanket.   4-Leukocytosis: likely stress related.  follow white blood cell WBC trending down.   Anemia: Monitor hemoglobin Check anemia panel in am.   Dementia without behavioral  disturbance: Continue with Lexapro At risk for delirium  GERD: Continue with PPI  CKD stage IV: hold lasix due to NPO status.  Low rate IV fluids while NPO  HTN; continue with Norvasc.    Estimated body mass index is 27.62 kg/m as calculated from the following:   Height as of 12/06/20: 5\' 2"  (1.575 m).   Weight as of 02/10/21: 68.5 kg.   DVT prophylaxis: SCD Code Status: DNR Family Communication: ortho discuss plan of care with family. No family at bedside during my evaluation.  Disposition Plan:  Status is: Inpatient Remains inpatient appropriate because:     Consultants:  Dr Erlinda Hong  Procedures:  Carotid doppler 1-39% ICA  Antimicrobials:    Subjective: She is alert, pleasantly confuse, denies chest pain or dyspnea.    Objective: Vitals:   09/09/21 0300 09/09/21 0400 09/09/21 0500 09/09/21 0600  BP: (!) 170/65 (!) 164/77 (!) 147/78 (!) 157/65  Pulse: (!) 56 69 62 62  Resp: 17 20 19 19   Temp:      TempSrc:      SpO2: 96% 91% 95% 96%    Intake/Output Summary (Last 24 hours) at 09/09/2021 0836 Last data filed at 09/08/2021 1514 Gross per 24 hour  Intake 1000 ml  Output --  Net 1000 ml   There were no vitals filed for this visit.  Examination:  General exam: Appears calm and comfortable  Respiratory system: Clear to auscultation. Respiratory effort normal. Cardiovascular system: S1 & S2 heard, RRR. No JVD, murmurs, rubs, gallops or clicks. No pedal edema. Gastrointestinal system: Abdomen is nondistended, soft and nontender. No organomegaly or masses felt. Normal bowel sounds heard. Central nervous  system: Alert and oriented. No focal neurological deficits. Extremities: Symmetric 5 x 5 power. Skin: No rashes, lesions or ulcers Psychiatry: Judgement and insight appear normal. Mood & affect appropriate.     Data Reviewed: I have personally reviewed following labs and imaging studies  CBC: Recent Labs  Lab 09/08/21 1106 09/09/21 0435  WBC 14.0* 11.1*   NEUTROABS 11.4*  --   HGB 10.5* 9.5*  HCT 32.6* 29.2*  MCV 98.5 99.7  PLT 240 324   Basic Metabolic Panel: Recent Labs  Lab 09/08/21 1106 09/09/21 0435  NA 139 144  K 3.9 3.6  CL 106 111  CO2 23 21*  GLUCOSE 121* 80  BUN 53* 49*  CREATININE 2.44* 2.13*  CALCIUM 9.0 8.4*   GFR: CrCl cannot be calculated (Unknown ideal weight.). Liver Function Tests: Recent Labs  Lab 09/09/21 0435  AST 23  ALT 16  ALKPHOS 42  BILITOT 0.9  PROT 6.2*  ALBUMIN 2.6*   No results for input(s): "LIPASE", "AMYLASE" in the last 168 hours. No results for input(s): "AMMONIA" in the last 168 hours. Coagulation Profile: No results for input(s): "INR", "PROTIME" in the last 168 hours. Cardiac Enzymes: No results for input(s): "CKTOTAL", "CKMB", "CKMBINDEX", "TROPONINI" in the last 168 hours. BNP (last 3 results) No results for input(s): "PROBNP" in the last 8760 hours. HbA1C: No results for input(s): "HGBA1C" in the last 72 hours. CBG: No results for input(s): "GLUCAP" in the last 168 hours. Lipid Profile: No results for input(s): "CHOL", "HDL", "LDLCALC", "TRIG", "CHOLHDL", "LDLDIRECT" in the last 72 hours. Thyroid Function Tests: No results for input(s): "TSH", "T4TOTAL", "FREET4", "T3FREE", "THYROIDAB" in the last 72 hours. Anemia Panel: No results for input(s): "VITAMINB12", "FOLATE", "FERRITIN", "TIBC", "IRON", "RETICCTPCT" in the last 72 hours. Sepsis Labs: No results for input(s): "PROCALCITON", "LATICACIDVEN" in the last 168 hours.  No results found for this or any previous visit (from the past 240 hour(s)).       Radiology Studies: VAS US CAROTID  Result Date: 09/08/2021 Carotid Arterial Duplex Study Patient Name:  BRITTIANY WIEHE  Date of Exam:   09/08/2021 Medical Rec #: [id number]       Accession #:    [id number] Date of Birth: September 09, 1928       Patient Gender: F Patient Age:   27 years Exam Location:  [facility] Procedure:      VAS US CAROTID Referring Phys: Shanon Brow  ORTIZ --------------------------------------------------------------------------------  Indications:       Atherosclerosis by CT. Risk Factors:      Hypertension, hyperlipidemia. Other Factors:     Extensive calcific atherosclerosis at the bilateral carotid                    bifurcations by CT, unable to have CTA secondary to CKD.                    Atrial fibrillation. Status post fall resulting in hip                    fracture. Limitations        Today's exam was limited due to tachycardia and arrythmia. Comparison Study:  No prior study on file Performing Technologist: Sharion Dove RVS  Examination Guidelines: A complete evaluation includes B-mode imaging, spectral Doppler, color Doppler, and power Doppler as needed of all accessible portions of each vessel. Bilateral testing is considered an integral part of a complete examination. Limited examinations for reoccurring indications may be performed  as noted.  Right Carotid Findings: +----------+--------+--------+--------+------------------+---------+           PSV cm/sEDV cm/sStenosisPlaque DescriptionComments  +----------+--------+--------+--------+------------------+---------+ CCA Prox  47      8               heterogenous                +----------+--------+--------+--------+------------------+---------+ CCA Distal36      8               heterogenous                +----------+--------+--------+--------+------------------+---------+ ICA Prox  111     39              calcific          Shadowing +----------+--------+--------+--------+------------------+---------+ ICA Mid   87      28                                          +----------+--------+--------+--------+------------------+---------+ ICA Distal85      20                                          +----------+--------+--------+--------+------------------+---------+ ECA       67      5                                            +----------+--------+--------+--------+------------------+---------+ +----------+--------+-------+--------+-------------------+           PSV cm/sEDV cmsDescribeArm Pressure (mmHG) +----------+--------+-------+--------+-------------------+ [id number]                                        +----------+--------+-------+--------+-------------------+ +---------+--------+--+--------+-+ VertebralPSV cm/s26EDV cm/s4 +---------+--------+--+--------+-+  Left Carotid Findings: +----------+--------+--------+--------+------------------+---------+           PSV cm/sEDV cm/sStenosisPlaque DescriptionComments  +----------+--------+--------+--------+------------------+---------+ CCA Prox  65      7               heterogenous                +----------+--------+--------+--------+------------------+---------+ CCA Distal61      13              heterogenous                +----------+--------+--------+--------+------------------+---------+ ICA Prox  77      20              calcific          Shadowing +----------+--------+--------+--------+------------------+---------+ ICA Mid   109     26                                          +----------+--------+--------+--------+------------------+---------+ ICA Distal55      13                                          +----------+--------+--------+--------+------------------+---------+ ECA  36      2                                           +----------+--------+--------+--------+------------------+---------+ +----------+--------+--------+--------+-------------------+           PSV cm/sEDV cm/sDescribeArm Pressure (mmHG) +----------+--------+--------+--------+-------------------+ Subclavian80                                          +----------+--------+--------+--------+-------------------+ +---------+--------+--+--------+--+ VertebralPSV cm/s54EDV cm/s12 +---------+--------+--+--------+--+   Summary:  Right Carotid: Velocities in the right ICA are consistent with a 1-39% stenosis. Left Carotid: Velocities in the left ICA are consistent with a 1-39% stenosis. Vertebrals:  Bilateral vertebral arteries demonstrate antegrade flow. Subclavians: Normal flow hemodynamics were seen in bilateral subclavian              arteries. *See table(s) above for measurements and observations.  Electronically signed by Orlie Pollen on 09/08/2021 at 4:48:43 PM.    Final    DG Chest 2 View  Result Date: 09/08/2021 CLINICAL DATA:  Fall, dementia, left hip fracture EXAM: CHEST - 2 VIEW COMPARISON:  02/10/2021 FINDINGS: Similar cardiomegaly and central vascular congestion. Negative for CHF or definite pneumonia. Minor basilar atelectasis. No effusion or pneumothorax. Trachea midline. Aorta atherosclerotic. No acute osseous finding. Degenerative changes throughout the spine. IMPRESSION: Cardiomegaly without acute process Aortic Atherosclerosis (ICD10-I70.0). Electronically Signed   By: Jerilynn Mages.  Shick M.D.   On: 09/08/2021 10:23   DG Knee Complete 4 Views Right  Result Date: 09/08/2021 CLINICAL DATA:  Fall, left hip fracture EXAM: RIGHT KNEE - COMPLETE 4+ VIEW COMPARISON:  09/08/2021 FINDINGS: Bones are osteopenic. Diffuse right knee degenerative changes and chondrocalcinosis. No acute osseous finding, fracture, malalignment or effusion. Peripheral atherosclerosis. No focal soft tissue abnormality. IMPRESSION: Osteopenia and degenerative changes. No acute finding by plain radiography Electronically Signed   By: Jerilynn Mages.  Shick M.D.   On: 09/08/2021 10:21   DG Hip Unilat W or Wo Pelvis 2-3 Views Left  Result Date: 09/08/2021 CLINICAL DATA:  Unwitnessed fall, pain EXAM: DG HIP (WITH OR WITHOUT PELVIS) 2-3V LEFT COMPARISON:  09/08/2021 FINDINGS: There is an acute displaced and mildly impacted left hip subcapital femoral neck fracture noted. Bones are osteopenic. Pelvis and right hip appear intact. Peripheral atherosclerosis noted.  IMPRESSION: Acute displaced left hip subcapital femoral neck fracture. Electronically Signed   By: Jerilynn Mages.  Shick M.D.   On: 09/08/2021 10:20   DG Knee Complete 4 Views Left  Result Date: 09/08/2021 CLINICAL DATA:  Unwitnessed fall, dementia EXAM: LEFT KNEE - COMPLETE 4+ VIEW COMPARISON:  09/08/2021 FINDINGS: Bones are osteopenic. Degenerative changes throughout the knee and chondrocalcinosis. No acute osseous finding, fracture, malalignment, or effusion. Patella located. No soft tissue abnormality. Peripheral vascular calcifications present. IMPRESSION: Osteopenia and degenerative changes as above. No acute osseous finding. Peripheral atherosclerosis Electronically Signed   By: Jerilynn Mages.  Shick M.D.   On: 09/08/2021 10:17   CT Head Wo Contrast  Result Date: 09/08/2021 CLINICAL DATA:  Neck trauma (Age >= 65y); Head trauma, moderate-severe EXAM: CT HEAD WITHOUT CONTRAST CT CERVICAL SPINE WITHOUT CONTRAST TECHNIQUE: Multidetector CT imaging of the head and cervical spine was performed following the standard protocol without intravenous contrast. Multiplanar CT image reconstructions of the cervical spine were also generated. RADIATION DOSE REDUCTION: This exam was performed according to  the departmental dose-optimization program which includes automated exposure control, adjustment of the mA and/or kV according to patient size and/or use of iterative reconstruction technique. COMPARISON:  None Available. FINDINGS: CT HEAD FINDINGS Brain: No evidence of acute infarction, hemorrhage, hydrocephalus, extra-axial collection or mass lesion/mass effect. Cerebral atrophy. Vascular: Calcific intracranial atherosclerosis. Skull: No acute fracture. Sinuses/Orbits: Clear sinuses.  No acute orbital findings. Other: No mastoid effusions. CT CERVICAL SPINE FINDINGS Alignment: Unchanged alignment. Similar slight anterolisthesis of C7 on T1. Straightening. Skull base and vertebrae: Vertebral body heights are maintained. No evidence of  acute fracture. Soft tissues and spinal canal: No prevertebral fluid or swelling. No visible canal hematoma. Disc levels: Multilevel degenerative change including degenerative disc disease, facet and uncovertebral hypertrophy with varying degrees of neural foraminal stenosis. Upper chest: Visualized lung apices are clear. Other: Extensive calcific atherosclerosis of the right greater than left carotid bifurcations. Subcentimeter thyroid nodules. Further imaging is not required (ref: J Am Coll Radiol. 2015 Feb;12(2): 143-50). IMPRESSION: 1. No evidence of acute intracranial abnormality. 2. No evidence of acute fracture or traumatic malalignment in the cervical spine. 3. Extensive calcific atherosclerosis of the right greater than left carotid bifurcations. Potentially hemodynamically significant stenosis on the right. A CT of the neck could further characterize if clinically warranted. Electronically Signed   By: Margaretha Sheffield M.D.   On: 09/08/2021 09:59   CT Cervical Spine Wo Contrast  Result Date: 09/08/2021 CLINICAL DATA:  Neck trauma (Age >= 65y); Head trauma, moderate-severe EXAM: CT HEAD WITHOUT CONTRAST CT CERVICAL SPINE WITHOUT CONTRAST TECHNIQUE: Multidetector CT imaging of the head and cervical spine was performed following the standard protocol without intravenous contrast. Multiplanar CT image reconstructions of the cervical spine were also generated. RADIATION DOSE REDUCTION: This exam was performed according to the departmental dose-optimization program which includes automated exposure control, adjustment of the mA and/or kV according to patient size and/or use of iterative reconstruction technique. COMPARISON:  None Available. FINDINGS: CT HEAD FINDINGS Brain: No evidence of acute infarction, hemorrhage, hydrocephalus, extra-axial collection or mass lesion/mass effect. Cerebral atrophy. Vascular: Calcific intracranial atherosclerosis. Skull: No acute fracture. Sinuses/Orbits: Clear sinuses.   No acute orbital findings. Other: No mastoid effusions. CT CERVICAL SPINE FINDINGS Alignment: Unchanged alignment. Similar slight anterolisthesis of C7 on T1. Straightening. Skull base and vertebrae: Vertebral body heights are maintained. No evidence of acute fracture. Soft tissues and spinal canal: No prevertebral fluid or swelling. No visible canal hematoma. Disc levels: Multilevel degenerative change including degenerative disc disease, facet and uncovertebral hypertrophy with varying degrees of neural foraminal stenosis. Upper chest: Visualized lung apices are clear. Other: Extensive calcific atherosclerosis of the right greater than left carotid bifurcations. Subcentimeter thyroid nodules. Further imaging is not required (ref: J Am Coll Radiol. 2015 Feb;12(2): 143-50). IMPRESSION: 1. No evidence of acute intracranial abnormality. 2. No evidence of acute fracture or traumatic malalignment in the cervical spine. 3. Extensive calcific atherosclerosis of the right greater than left carotid bifurcations. Potentially hemodynamically significant stenosis on the right. A CT of the neck could further characterize if clinically warranted. Electronically Signed   By: Margaretha Sheffield M.D.   On: 09/08/2021 09:59   CT PELVIS WO CONTRAST  Result Date: 09/08/2021 CLINICAL DATA:  Hip trauma, fracture suspected, xray done EXAM: CT PELVIS WITHOUT CONTRAST TECHNIQUE: Multidetector CT imaging of the pelvis was performed following the standard protocol without intravenous contrast. RADIATION DOSE REDUCTION: This exam was performed according to the departmental dose-optimization program which includes automated exposure control, adjustment of the mA and/or  kV according to patient size and/or use of iterative reconstruction technique. COMPARISON:  None Available. FINDINGS: Urinary Tract:  Distended bladder. Bowel:  Unremarkable visualized pelvic bowel loops. Vascular/Lymphatic: Aorto bi-iliac atherosclerosis. Reproductive:  No  mass or other significant abnormality Musculoskeletal: Potentially acute or subacute impacted left subcapital femoral neck fracture. Approximately half shaft with superior displacement of the distal fracture fragment. Osteopenia. IMPRESSION: 1. Potentially acute or subacute impacted left subcapital femoral neck fracture. Approximately half shaft with superior displacement of the distal fracture fragment. 2. Osteopenia. 3. Aortic atherosclerosis (ICD10-I70.0). Electronically Signed   By: Margaretha Sheffield M.D.   On: 09/08/2021 09:54        Scheduled Meds:  ALPRAZolam  0.25 mg Oral BID   amLODipine  10 mg Oral Daily   calcium carbonate  1,000 mg Oral Daily   docusate sodium  100 mg Oral QHS   escitalopram  10 mg Oral Daily   feeding supplement  237 mL Oral BID BM   ferrous sulfate  325 mg Oral Q breakfast   folic acid  1 mg Oral QODAY   furosemide  20 mg Oral Daily   magnesium oxide  400 mg Oral BID   pantoprazole  40 mg Oral Daily   rosuvastatin  10 mg Oral QHS   Continuous Infusions:   LOS: 1 day    Time spent: [place], MD [facility]   If 7PM-7AM, please contact night-coverage [email or web address]  09/09/2021, 8:36 AM

## 2021-09-09 NOTE — ED Notes (Signed)
[facility] at bedside 

## 2021-09-09 NOTE — Discharge Instructions (Signed)
° ° °  1. Change dressings as needed °2. May shower but keep incisions covered and dry °3. Take lovenox to prevent blood clots °4. Take stool softeners as needed °5. Take pain meds as needed ° °

## 2021-09-09 NOTE — H&P (Signed)

## 2021-09-10 DIAGNOSIS — S72002A Fracture of unspecified part of neck of left femur, initial encounter for closed fracture: Secondary | ICD-10-CM | POA: Diagnosis not present

## 2021-09-10 LAB — BASIC METABOLIC PANEL
Anion gap: 12 (ref 5–15)
BUN: 55 mg/dL — ABNORMAL HIGH (ref 8–23)
CO2: 21 mmol/L — ABNORMAL LOW (ref 22–32)
Calcium: 8.1 mg/dL — ABNORMAL LOW (ref 8.9–10.3)
Chloride: 105 mmol/L (ref 98–111)
Creatinine, Ser: 2.46 mg/dL — ABNORMAL HIGH (ref 0.44–1.00)
GFR, Estimated: 18 mL/min — ABNORMAL LOW (ref >60)
Glucose, Bld: 142 mg/dL — ABNORMAL HIGH (ref 70–99)
Potassium: 4.3 mmol/L (ref 3.5–5.1)
Sodium: 138 mmol/L (ref 135–145)

## 2021-09-10 LAB — CBC
HCT: 24.4 % — ABNORMAL LOW (ref 36.0–46.0)
Hemoglobin: 7.8 g/dL — ABNORMAL LOW (ref 12.0–15.0)
MCH: 31.5 pg (ref 26.0–34.0)
MCHC: 32 g/dL (ref 30.0–36.0)
MCV: 98.4 fL (ref 80.0–100.0)
Platelets: 241 10*3/uL (ref 150–400)
RBC: 2.48 MIL/uL — ABNORMAL LOW (ref 3.87–5.11)
RDW: 14.9 % (ref 11.5–15.5)
WBC: 12.8 10*3/uL — ABNORMAL HIGH (ref 4.0–10.5)
nRBC: 0 % (ref 0.0–0.2)

## 2021-09-10 LAB — PREPARE RBC (CROSSMATCH)

## 2021-09-10 LAB — RETICULOCYTES
Immature Retic Fract: 21 % — ABNORMAL HIGH (ref 2.3–15.9)
RBC.: 2.46 MIL/uL — ABNORMAL LOW (ref 3.87–5.11)
Retic Count, Absolute: 43.1 10*3/uL (ref 19.0–186.0)
Retic Ct Pct: 1.8 % (ref 0.4–3.1)

## 2021-09-10 LAB — VITAMIN B12: Vitamin B-12: 811 pg/mL (ref 180–914)

## 2021-09-10 LAB — IRON AND TIBC
Iron: 19 ug/dL — ABNORMAL LOW (ref 28–170)
Saturation Ratios: 12 % (ref 10.4–31.8)
TIBC: 160 ug/dL — ABNORMAL LOW (ref 250–450)
UIBC: 141 ug/dL

## 2021-09-10 LAB — FERRITIN: Ferritin: 324 ng/mL — ABNORMAL HIGH (ref 11–307)

## 2021-09-10 LAB — FOLATE: Folate: 29.3 ng/mL (ref >5.9)

## 2021-09-10 MED ORDER — ADULT MULTIVITAMIN W/MINERALS CH
1.0000 | ORAL_TABLET | Freq: Every day | ORAL | Status: DC
  Administered 2021-09-10 – 2021-09-18 (×9): 1 via ORAL
  Filled 2021-09-10 (×9): qty 1

## 2021-09-10 MED ORDER — ORAL CARE MOUTH RINSE: 15.0000 mL | OROMUCOSAL | Status: DC | PRN

## 2021-09-10 MED ORDER — SODIUM CHLORIDE 0.9% IV SOLUTION
Freq: Once | INTRAVENOUS | Status: AC
Start: 2021-09-10 — End: 2021-09-10

## 2021-09-10 NOTE — Progress Notes (Signed)
Phone consent obtained from son Gershon Mussel with dual RN signatures.  MD had already explained the risks and benefits of blood transfusions with Tom.  Tom v/u and agreed with plan.

## 2021-09-10 NOTE — Evaluation (Addendum)
Physical Therapy Evaluation Patient Details Name: Tanya Koch MRN: [id number] DOB: 1928-05-30 Today's Date: 09/10/2021  History of Present Illness  Pt is a 88 y.o. female with PMH of bradycardia, history of MI, history of unspecified CHF with recovered EF, COVID-19, emphysema, history of hematemesis, hypertension, dementia, history of closed right hip fracture in December 2022 who had an unwitnessed fall and was found on the floor at her SNF facility this morning with inability to stand up and bear weight on her LLE. Pt s/p left hip hemiarthroplasty performed on 07/14.  Clinical Impression  Pt agreeable to physical therapy evaluation with family member present to provide pt's baseline level 2/2 pt's dementia. Pt unable to perform sit to stand with RW and max assist x 2 due to severe LE weakness and deconditioning. Pt transferred from bed to chair with total assistance (pt provided <25% of effort) via squat pivot technique without AD. Pt currently presents with functional limitations secondary to impairments listed in PT problem list. Pt to benefit from skilled, acute care physical therapy interventions to maximize her independence level and quality of life.       Recommendations for follow up therapy are one component of a multi-disciplinary discharge planning process, led by the attending physician.  Recommendations may be updated based on patient status, additional functional criteria and insurance authorization.  Follow Up Recommendations Skilled nursing-short term rehab (<3 hours/day) Can patient physically be transported by private vehicle: No    Assistance Recommended at Discharge Frequent or constant Supervision/Assistance  Patient can return home with the following  A lot of help with walking and/or transfers;A lot of help with bathing/dressing/bathroom;Assistance with cooking/housework;Assistance with feeding;Assist for transportation;Direct supervision/assist for financial  management;Direct supervision/assist for medications management    Equipment Recommendations None recommended by PT  Recommendations for Other Services       Functional Status Assessment Patient has had a recent decline in their functional status and/or demonstrates limited ability to make significant improvements in function in a reasonable and predictable amount of time     Precautions / Restrictions Precautions Precautions: Fall Precaution Comments: no hip precautions Restrictions Weight Bearing Restrictions: Yes LLE Weight Bearing: Weight bearing as tolerated      Mobility  Bed Mobility Overal bed mobility: Needs Assistance Bed Mobility: Supine to Sit     Supine to sit: Min assist     General bed mobility comments: Increased time to complete task. Sling pad utilized for scooting.    Transfers Overall transfer level: Needs assistance Equipment used: Rolling walker (2 wheels), None Transfers: Bed to chair/wheelchair/BSC, Sit to/from Stand Sit to Stand: Max assist, +2 physical assistance, +2 safety/equipment     Squat pivot transfers: Total assist     General transfer comment: Attempted max assist x 2 with and without RW for stand pivot transfer but pt unable to achieve full standing position. Pt performed total assist squat pivot transfer thereafter with PT directly in front of pt (pt grabbing therapist's elbows) and OT helping for line management and safety. Pt demonstrated severe hip and knee flexion bilaterally with knee valgus.    Ambulation/Gait               General Gait Details: unable  Stairs            Wheelchair Mobility    Modified Rankin (Stroke Patients Only)       Balance Overall balance assessment: Needs assistance Sitting-balance support: Single extremity supported, Bilateral upper extremity supported Sitting balance-Leahy Scale: Fair  Standing balance support: Bilateral upper extremity supported Standing balance-Leahy  Scale: Zero                               Pertinent Vitals/Pain Pain Assessment Pain Assessment: 0-10 Pain Score: 0-No pain Pain Location: L hip Pain Descriptors / Indicators:  (unable to obtain) Pain Intervention(s): Limited activity within patient's tolerance, Monitored during session    Home Living Family/patient expects to be discharged to:: Assisted living                 Home Equipment: Wheelchair - Publishing copy (2 wheels) Additional Comments: from memory care unit    Prior Function Prior Level of Function : Needs assist;History of Falls (last six months)  Cognitive Assist : Mobility (cognitive);ADLs (cognitive) Mobility (Cognitive): Step by step cues ADLs (Cognitive): Step by step cues Physical Assist : ADLs (physical)   ADLs (physical): Feeding;Grooming;Bathing;Dressing;Toileting;IADLs Mobility Comments: Pt required max assistance x 1 for transfers. Pt primarily in [place]. Unknown level of assistance for walking.       Hand Dominance        Extremity/Trunk Assessment   Upper Extremity Assessment Upper Extremity Assessment: Defer to OT evaluation    Lower Extremity Assessment Lower Extremity Assessment:  (Grossly 3- to 3+/5 in major muscle groups R LE and 2- to 3-/5 L LE. Pt with severe IR of L hip (no ER present).)    Cervical / Trunk Assessment Cervical / Trunk Assessment: Kyphotic  Communication   Communication: HOH  Cognition Arousal/Alertness: Awake/alert Behavior During Therapy: Flat affect Overall Cognitive Status: History of cognitive impairments - at baseline                                 General Comments: Pt has dementia.        General Comments      Exercises     Assessment/Plan    PT Assessment Patient needs continued PT services  PT Problem List Decreased strength;Decreased range of motion;Decreased activity tolerance;Decreased balance;Decreased mobility;Decreased cognition;Decreased knowledge  of use of DME;Decreased safety awareness;Decreased knowledge of precautions;Pain       PT Treatment Interventions DME instruction;Gait training;Functional mobility training;Therapeutic activities;Therapeutic exercise;Wheelchair mobility training;Patient/family education;Neuromuscular re-education;Balance training;Modalities    PT Goals (Current goals can be found in the Care Plan section)  Acute Rehab PT Goals Patient Stated Goal: unable to state PT Goal Formulation: With patient Time For Goal Achievement: 09/16/21 Potential to Achieve Goals:  (poor to fair) Additional Goals Additional Goal #1: Pt will mobilize herself in [facility] >/= 100 feet with moderate assistance from therapist for turning and safety.    Frequency Min 3X/week     Co-evaluation               AM-PAC PT "6 Clicks" Mobility  Outcome Measure Help needed turning from your back to your side while in a flat bed without using bedrails?: A Little Help needed moving from lying on your back to sitting on the side of a flat bed without using bedrails?: A Little Help needed moving to and from a bed to a chair (including a wheelchair)?: Total Help needed standing up from a chair using your arms (e.g., wheelchair or bedside chair)?: Total Help needed to walk in hospital room?: Total Help needed climbing 3-5 steps with a railing? : Total 6 Click Score: 10    End of Session Equipment  Utilized During Treatment: Gait belt Activity Tolerance: Other (comment) (limited by pt's weakness and poor motor planning) Patient left: in chair;with chair alarm set (lift pad in place underneath pt) Nurse Communication: Mobility status;Need for lift equipment PT Visit Diagnosis: Other abnormalities of gait and mobility (R26.89);Repeated falls (R29.6);Muscle weakness (generalized) (M62.81);Pain Pain - Right/Left: Left Pain - part of body: Hip    Time: 7915-0569 PT Time Calculation (min) (ACUTE ONLY): 28 min   Charges:   PT  Evaluation $PT Eval Moderate Complexity: 1 Mod          Donna Bernard, PT   [facility] 09/10/2021, 4:07 PM

## 2021-09-10 NOTE — Plan of Care (Signed)

## 2021-09-10 NOTE — Evaluation (Signed)
Clinical/Bedside Swallow Evaluation Patient Details  Name: Tanya Koch MRN: [id number] Date of Birth: 09-12-1928  Today's Date: 09/10/2021 Time: SLP Start Time (ACUTE ONLY): 1155 SLP Stop Time (ACUTE ONLY): 1214 SLP Time Calculation (min) (ACUTE ONLY): 19 min  Past Medical History:  Past Medical History:  Diagnosis Date   Bradycardia    CHF (congestive heart failure) ([place])    Closed right hip fracture, sequela 02/10/2021   COVID 12/06/2020   Emphysema, unspecified ([place])    Hematemesis 12/05/2020   History of MI (myocardial infarction)    Hypertension    Past Surgical History:  Past Surgical History:  Procedure Laterality Date   HIP ARTHROPLASTY Right    HPI:  Pt is a 88 yo female admitted after unwitnessed fall with L hip fx s/p prostehtic replacement for L femoral neck fx on 7/14. OP MBS 08/16/21 whoed flash penetration that was WNL for age but with concerns for reduced clearance of the esophagus. Dys 3 diet and thin liquids with meds crushed in puree recommended, as well as consideration for GI consult. PMH  also includes: dementia, COPD/emphysema, former smoker, CAD, bradycardia, CHF, HTN, COVID    Assessment / Plan / Recommendation  Clinical Impression  Pt's oropharyngeal swallow appears to be generally functional and consistent with outpatient MBS recently completed, at which time Dys 3 diet and thin liquids were recommended. That evaluating SLP describes concern for a primary esophageal component and did recommend that pills be crushed as the barium tablet did not appear to exit the mid-distal esophagus. Recommend continuing Dys 3 diet and thin liquids with meds crushed in puree, following aspiration and esophageal precautions as able (note that positioning may be somewhat limited in light of acute hip fx s/p surgical repair). In light of any possible acute deconditioning and cognitive status, will f/u at least briefly. SLP Visit Diagnosis: Dysphagia, unspecified (R13.10)     Aspiration Risk  Mild aspiration risk;Moderate aspiration risk    Diet Recommendation Dysphagia 3 (Mech soft);Thin liquid   Liquid Administration via: Cup;Straw Medication Administration: Crushed with puree Supervision: Staff to assist with self feeding Compensations: Minimize environmental distractions;Slow rate;Small sips/bites;Follow solids with liquid Postural Changes: Seated upright at 90 degrees;Remain upright for at least 30 minutes after po intake    Other  Recommendations Oral Care Recommendations: Oral care BID    Recommendations for follow up therapy are one component of a multi-disciplinary discharge planning process, led by the attending physician.  Recommendations may be updated based on patient status, additional functional criteria and insurance authorization.  Follow up Recommendations No SLP follow up      Assistance Recommended at Discharge Frequent or constant Supervision/Assistance  Functional Status Assessment Patient has had a recent decline in their functional status and demonstrates the ability to make significant improvements in function in a reasonable and predictable amount of time.  Frequency and Duration min 2x/week  1 week       Prognosis Prognosis for Safe Diet Advancement: Good Barriers to Reach Goals: Cognitive deficits;Time post onset      Swallow Study   General HPI: Pt is a 88 yo female admitted after unwitnessed fall with L hip fx s/p prostehtic replacement for L femoral neck fx on 7/14. OP MBS 08/16/21 whoed flash penetration that was WNL for age but with concerns for reduced clearance of the esophagus. Dys 3 diet and thin liquids with meds crushed in puree recommended, as well as consideration for GI consult. PMH  also includes: dementia, COPD/emphysema, former smoker, CAD,  bradycardia, CHF, HTN, COVID Type of Study: Bedside Swallow Evaluation Previous Swallow Assessment: see HPI Diet Prior to this Study: Dysphagia 3 (soft);Thin  liquids Temperature Spikes Noted: No Respiratory Status: Room air History of Recent Intubation: No Behavior/Cognition: Alert;Cooperative;Pleasant mood;Confused;Requires cueing Oral Cavity Assessment: Within Functional Limits Oral Care Completed by SLP: No Oral Cavity - Dentition: Missing dentition (missing lower dentition) Vision: Functional for self-feeding Self-Feeding Abilities: Able to feed self Patient Positioning: Upright in bed Baseline Vocal Quality: Normal Volitional Cough: Strong Volitional Swallow: Able to elicit    Oral/Motor/Sensory Function Overall Oral Motor/Sensory Function: Within functional limits   Ice Chips Ice chips: Not tested   Thin Liquid Thin Liquid: Within functional limits Presentation: Straw    Nectar Thick Nectar Thick Liquid: Not tested   Honey Thick Honey Thick Liquid: Not tested   Puree Puree: Within functional limits Presentation: Spoon   Solid     Solid: Within functional limits Presentation: Self Fed      Osie Bond., M.A. [place] Office [phone]  Secure chat preferred  09/10/2021,12:56 PM

## 2021-09-10 NOTE — Plan of Care (Signed)
  Problem: Clinical Measurements: Goal: Cardiovascular complication will be avoided Outcome: Progressing   Problem: Nutrition: Goal: Adequate nutrition will be maintained Outcome: Progressing   Problem: Coping: Goal: Level of anxiety will decrease Outcome: Progressing

## 2021-09-10 NOTE — Progress Notes (Addendum)
PROGRESS NOTE Tanya Koch  DUK:[id number] DOB: [birth year] DOA: 09/08/2021 PCP: Earmon Phoenix, NP   Brief Narrative/Hospital Course: bradycardia, history of MI, unspecified heart failure, with recovered ejection fraction, COVID-19, emphysema, hypertension,  history of hematemesis, history of right closed right hip fracture December 2022 who had an unwitnessed fall and was found on the floor at her skilled nursing facility, in ED was found to have left hip fracture,CT cervical spine and CT head with no acute abnormality but show extensive calcific atherosclerosis of both carotid bifurcation-underwent carotid Doppler that shows 1 to 39% stenosis.,  Bilateral vertebral arteries demonstrated antegrade flow.  [facility] underwent left hip hemiarthroplasty by Dr. Erlinda Hong 7/14.    Subjective: Seen and examined. Overnight afebrile BP in 140s to 150s, on room air Labs showed blood loss anemia hemoglobin 7.8 g from 9.5 Creatinine is at 2.4 Patient has dementia but aware she is in the hospital did not know she had a broken hip.  Assessment and Plan: Principal Problem:   Closed left hip fracture, initial encounter [facility]) Active Problems:   Dementia without behavioral disturbance (HCC)   GERD (gastroesophageal reflux disease)   Chronic kidney disease (CKD), stage IV (severe) (HCC)   Hypertension   Emphysema lung (HCC)   Hyperlipidemia   Hypothermia   Normocytic anemia   Leukocytosis   Closed left hip fracture, initial encounter s/p  left hip hemiarthroplasty by Dr. Erlinda Hong 7/14.  Surgically stable orthopedic following, WBAT LLE, DVT prophylaxis with Lovenox/SCD and outpatient follow-up with Dr. Beatriz Chancellor in 2 weeks.  Continue PT OT pain control bowel regimen and further plan as per orthopedics  Acute blood loss anemia in the setting of anemia chronic disease from CKD: Hemoglobin on admission 9.5 g now down trended-will likely need 1 unit PRBC transfusion we will trend h/h.  Discussed with Dr.  Erlinda Hong recommending1 unit transfusion.  Updated son, discussed risk benefits alternatives, and has agreed for 1 unit blood transfusion , Recent Labs  Lab 09/08/21 1106 09/09/21 0435 09/09/21 1829 09/10/21 0141  HGB 10.5* 9.5* 9.5* 7.8*  HCT 32.6* 29.2* 30.4* 24.4*   Leukocytosis likely reactive in the setting of hip fracture, downtrending.  Afebrile.  Monitor  Dementia without behavioral disturbance: Continue supportive care fall precaution delirium precaution continue Lexapro GERD-continue PPI  CKD stage IV creatinine around the baseline Lasix on hold monitor closely Essential hypertension continue home amlodipine.   Hypothermia on admission resolved, chest x-ray UA unremarkable continue warm blanket, TSH euthyroid  Emphysema lung-encourage incentive spirometry  Bilateral carotid artery disease-on CT imaging but Doppler unremarkable.  DVT prophylaxis: enoxaparin (LOVENOX) injection 30 mg Start: 09/10/21 0800 SCDs Start: 09/09/21 1712 Place TED hose Start: 09/09/21 1712 SCDs Start: 09/08/21 1108 Code Status:   Code Status: DNR Family Communication: plan of care discussed with patient at bedside. Patient status is: Inpatient because of positive management Level of care: Telemetry Medical   Dispo: The patient is from: SNF            Anticipated disposition:  snf in 2 DAYS  Mobility Assessment (last 72 hours)     Mobility Assessment     Row Name 09/09/21 22:47:42           Does patient have an order for bedrest or is patient medically unstable No - Continue assessment       What is the highest level of mobility based on the progressive mobility assessment? Level 1 (Bedfast) - Unable to balance while sitting on edge of bed  Is the above level different from baseline mobility prior to current illness? Yes - Recommend PT order                 Objective: Vitals last 24 hrs: Vitals:   09/09/21 2244 09/10/21 0500 09/10/21 0532 09/10/21 0751  BP:  (!) 150/53 (!)  145/65 (!) 153/58  Pulse:  74 70 67  Resp:  15 15 19   Temp:  98.5 F (36.9 C) 98.7 F (37.1 C) 99 F (37.2 C)  TempSrc:  Oral  Oral  SpO2:   98%   Weight: 68.8 kg     Height:       Weight change:   Physical Examination: General exam: alert awake,older than stated age, weak appearing. HEENT:Oral mucosa moist, Ear/Nose WNL grossly, dentition normal. Respiratory system: bilaterally diminished BS, no use of accessory muscle Cardiovascular system: S1 & S2 +, No JVD. Gastrointestinal system: Abdomen soft,NT,ND, BS+ Nervous System:Alert, awake, moving extremities and grossly nonfocal Extremities: Left hip surgical site with dressing in place no edema NEG,,distal peripheral pulses palpable.  Skin: No rashes,no icterus. MSK: Normal muscle bulk,tone, power  Medications reviewed:  Scheduled Meds:  acetaminophen  500 mg Oral Q6H   ALPRAZolam  0.25 mg Oral BID   amLODipine  10 mg Oral Daily   calcium carbonate  1,000 mg Oral Daily   Chlorhexidine Gluconate Cloth  6 each Topical Q0600   docusate sodium  100 mg Oral BID   enoxaparin (LOVENOX) injection  30 mg Subcutaneous Q24H   escitalopram  10 mg Oral Daily   feeding supplement  237 mL Oral BID BM   ferrous sulfate  325 mg Oral Q breakfast   folic acid  1 mg Oral QODAY   magnesium oxide  400 mg Oral BID   multivitamin with minerals  1 tablet Oral Daily   mupirocin ointment  1 Application Nasal BID   pantoprazole  40 mg Oral Daily   rosuvastatin  10 mg Oral QHS   tranexamic acid (CYKLOKAPRON) 2,000 mg in sodium chloride 0.9 % 50 mL Topical Application  4,401 mg Topical Once   Continuous Infusions:  sodium chloride 75 mL/hr at 09/09/21 2142    ceFAZolin (ANCEF) IV 2 g (09/10/21 1136)   lactated ringers 45 mL/hr at 09/09/21 1644   methocarbamol (ROBAXIN) IV        Diet Order             DIET DYS 3 Room service appropriate? Yes; Fluid consistency: Thin  Diet effective now                    Nutrition Problem: Increased  nutrient needs Etiology: hip fracture, post-op healing Signs/Symptoms: estimated needs Interventions: Ensure Enlive (each supplement provides 350kcal and 20 grams of protein), MVI   Intake/Output Summary (Last 24 hours) at 09/10/2021 1200 Last data filed at 09/09/2021 2247 Gross per 24 hour  Intake 800.41 ml  Output 1575 ml  Net -774.59 ml   Net IO Since Admission: 325.41 mL [09/10/21 1200]  Wt Readings from Last 3 Encounters:  09/09/21 68.8 kg  02/10/21 68.5 kg  02/07/21 67.9 kg     Unresulted Labs (From admission, onward)     Start     Ordered   09/16/21 0500  Creatinine, serum  (enoxaparin (LOVENOX)  CrCl >/= 30 mL/min  )  Weekly,   R     Comments: while on enoxaparin therapy.    09/09/21 1712   09/11/21 0500  Basic  metabolic panel  Daily at 5am,   R      09/10/21 0835   09/10/21 0500  CBC  Daily at 5am,   R     Comments: For 3 days.    09/09/21 1712           Data Reviewed: I have personally reviewed following labs and imaging studies CBC: Recent Labs  Lab 09/08/21 1106 09/09/21 0435 09/09/21 1829 09/10/21 0141  WBC 14.0* 11.1* 14.4* 12.8*  NEUTROABS 11.4*  --   --   --   HGB 10.5* 9.5* 9.5* 7.8*  HCT 32.6* 29.2* 30.4* 24.4*  MCV 98.5 99.7 99.7 98.4  PLT 240 232 268 563   Basic Metabolic Panel: Recent Labs  Lab 09/08/21 1106 09/09/21 0435 09/09/21 1829 09/10/21 0141  NA 139 144  --  138  K 3.9 3.6  --  4.3  CL 106 111  --  105  CO2 23 21*  --  21*  GLUCOSE 121* 80  --  142*  BUN 53* 49*  --  55*  CREATININE 2.44* 2.13* 2.38* 2.46*  CALCIUM 9.0 8.4*  --  8.1*   GFR: Estimated Creatinine Clearance: 14.5 mL/min (A) (by C-G formula based on SCr of 2.46 mg/dL (H)). Liver Function Tests: Recent Labs  Lab 09/09/21 0435  AST 23  ALT 16  ALKPHOS 42  BILITOT 0.9  PROT 6.2*  ALBUMIN 2.6*   No results for input(s): "LIPASE", "AMYLASE" in the last 168 hours. No results for input(s): "AMMONIA" in the last 168 hours. Coagulation Profile: No  results for input(s): "INR", "PROTIME" in the last 168 hours. BNP (last 3 results) No results for input(s): "PROBNP" in the last 8760 hours. HbA1C: No results for input(s): "HGBA1C" in the last 72 hours. CBG: No results for input(s): "GLUCAP" in the last 168 hours. Lipid Profile: No results for input(s): "CHOL", "HDL", "LDLCALC", "TRIG", "CHOLHDL", "LDLDIRECT" in the last 72 hours. Thyroid Function Tests: Recent Labs    09/09/21 1829  TSH 1.202   Sepsis Labs: No results for input(s): "PROCALCITON", "LATICACIDVEN" in the last 168 hours.  Recent Results (from the past 240 hour(s))  Surgical pcr screen     Status: Abnormal   Collection Time: 09/09/21  9:59 AM   Specimen: Nasal Mucosa; Nasal Swab  Result Value Ref Range Status   MRSA, PCR NEGATIVE NEGATIVE Final   Staphylococcus aureus POSITIVE (A) NEGATIVE Final    Comment: (NOTE) The Xpert SA Assay (FDA approved for NASAL specimens in patients 31 years of age and older), is one component of a comprehensive surveillance program. It is not intended to diagnose infection nor to guide or monitor treatment. Performed at [facility], [place] [address], [place], [place] [address]     Antimicrobials: Anti-infectives (From admission, onward)    Start     Dose/Rate Route Frequency Ordered Stop   09/09/21 1800  ceFAZolin (ANCEF) IVPB 2g/100 mL premix        2 g 200 mL/hr over 30 Minutes Intravenous Every 6 hours 09/09/21 1712 09/10/21 1206   09/09/21 1223  vancomycin (VANCOCIN) powder  Status:  Discontinued          As needed 09/09/21 1223 09/09/21 1324   09/09/21 1045  ceFAZolin (ANCEF) IVPB 2g/100 mL premix        2 g 200 mL/hr over 30 Minutes Intravenous On call to O.R. 09/09/21 1044 09/09/21 1151      Culture/Microbiology No results found for: "SDES", "SPECREQUEST", "CULT", "  REPTSTATUS"  Other culture-see note  Radiology Studies: Pelvis Portable  Result Date: 09/09/2021 CLINICAL DATA:  A 88 year old presents for  follow-up following hip arthroplasty. EXAM: PORTABLE PELVIS 1-2 VIEWS COMPARISON:  CT of the pelvis of July of 2013. FINDINGS: Immediate postoperative appearance of the LEFT hip following hip arthroplasty with no unexpected radiographic abnormality. Soft tissue gas over the LEFT hip. Osteopenia without signs of complication on AP views. Incidental note is made again of fracture of the RIGHT greater trochanter which is a chronic finding. IMPRESSION: 1. Immediate postoperative appearance following LEFT hip arthroplasty without signs of complication on AP views. 2. Chronic RIGHT greater trochanter fracture. Electronically Signed   By: Zetta Bills M.D.   On: 09/09/2021 13:45   DG HIP UNILAT WITH PELVIS 1V LEFT  Result Date: 09/09/2021 CLINICAL DATA:  Fracture left hip, fluoroscopic assistance for left hip arthroplasty EXAM: DG HIP (WITH OR WITHOUT PELVIS) 1V*L* COMPARISON:  09/08/2021 FINDINGS: Fluoroscopic images show left hip arthroplasty. Fluoroscopic time was 9 seconds. Radiation dose 0.34 mGy. IMPRESSION: Fluoroscopic assistance was provided for left hip arthroplasty. Electronically Signed   By: Elmer Picker M.D.   On: 09/09/2021 12:55   DG C-Arm 1-60 Min-No Report  Result Date: 09/09/2021 Fluoroscopy was utilized by the requesting physician.  No radiographic interpretation.   VAS US CAROTID  Result Date: 09/08/2021 Carotid Arterial Duplex Study Patient Name:  Tanya Koch  Date of Exam:   09/08/2021 Medical Rec #: [id number]       Accession #:    [id number] Date of Birth: 1928-08-25       Patient Gender: F Patient Age:   4 years Exam Location:  [facility] Procedure:      VAS US CAROTID Referring Phys: Shanon Brow ORTIZ --------------------------------------------------------------------------------  Indications:       Atherosclerosis by CT. Risk Factors:      Hypertension, hyperlipidemia. Other Factors:     Extensive calcific atherosclerosis at the bilateral carotid                     bifurcations by CT, unable to have CTA secondary to CKD.                    Atrial fibrillation. Status post fall resulting in hip                    fracture. Limitations        Today's exam was limited due to tachycardia and arrythmia. Comparison Study:  No prior study on file Performing Technologist: Sharion Dove RVS  Examination Guidelines: A complete evaluation includes B-mode imaging, spectral Doppler, color Doppler, and power Doppler as needed of all accessible portions of each vessel. Bilateral testing is considered an integral part of a complete examination. Limited examinations for reoccurring indications may be performed as noted.  Right Carotid Findings: +----------+--------+--------+--------+------------------+---------+           PSV cm/sEDV cm/sStenosisPlaque DescriptionComments  +----------+--------+--------+--------+------------------+---------+ CCA Prox  47      8               heterogenous                +----------+--------+--------+--------+------------------+---------+ CCA Distal36      8               heterogenous                +----------+--------+--------+--------+------------------+---------+ ICA Prox  111     39  calcific          Shadowing +----------+--------+--------+--------+------------------+---------+ ICA Mid   87      28                                          +----------+--------+--------+--------+------------------+---------+ ICA Distal85      20                                          +----------+--------+--------+--------+------------------+---------+ ECA       67      5                                           +----------+--------+--------+--------+------------------+---------+ +----------+--------+-------+--------+-------------------+           PSV cm/sEDV cmsDescribeArm Pressure (mmHG) +----------+--------+-------+--------+-------------------+ [id number]                                         +----------+--------+-------+--------+-------------------+ +---------+--------+--+--------+-+ VertebralPSV cm/s26EDV cm/s4 +---------+--------+--+--------+-+  Left Carotid Findings: +----------+--------+--------+--------+------------------+---------+           PSV cm/sEDV cm/sStenosisPlaque DescriptionComments  +----------+--------+--------+--------+------------------+---------+ CCA Prox  65      7               heterogenous                +----------+--------+--------+--------+------------------+---------+ CCA Distal61      13              heterogenous                +----------+--------+--------+--------+------------------+---------+ ICA Prox  77      20              calcific          Shadowing +----------+--------+--------+--------+------------------+---------+ ICA Mid   109     26                                          +----------+--------+--------+--------+------------------+---------+ ICA Distal55      13                                          +----------+--------+--------+--------+------------------+---------+ ECA       36      2                                           +----------+--------+--------+--------+------------------+---------+ +----------+--------+--------+--------+-------------------+           PSV cm/sEDV cm/sDescribeArm Pressure (mmHG) +----------+--------+--------+--------+-------------------+ Subclavian80                                          +----------+--------+--------+--------+-------------------+ +---------+--------+--+--------+--+ VertebralPSV cm/s54EDV cm/s12 +---------+--------+--+--------+--+   Summary: Right Carotid:  Velocities in the right ICA are consistent with a 1-39% stenosis. Left Carotid: Velocities in the left ICA are consistent with a 1-39% stenosis. Vertebrals:  Bilateral vertebral arteries demonstrate antegrade flow. Subclavians: Normal flow hemodynamics were seen in bilateral subclavian               arteries. *See table(s) above for measurements and observations.  Electronically signed by Orlie Pollen on 09/08/2021 at 4:48:43 PM.    Final      LOS: 2 days   Antonieta Pert, MD [facility]  09/10/2021, 12:00 PM

## 2021-09-10 NOTE — Progress Notes (Signed)
Initial Nutrition Assessment  DOCUMENTATION CODES:   Not applicable  INTERVENTION:   Ensure Enlive po BID, each supplement provides 350 kcal and 20 grams of protein.  MVI with minerals daily.  NUTRITION DIAGNOSIS:   Increased nutrient needs related to hip fracture, post-op healing as evidenced by estimated needs.  GOAL:   Patient will meet greater than or equal to 90% of their needs  MONITOR:   PO intake, Supplement acceptance  REASON FOR ASSESSMENT:   Consult Hip fracture protocol  ASSESSMENT:   88 yo female admitted with L hip fx. PMH includes CAD, bradycardia, CHF, HTN, COPD/emphysema, former smoker.  7/14 - S/P prosthetic replacement for L femoral neck fracture.  RD working remotely. Unable to speak with patient or complete NFPE. Weight history reviewed. No significant weight changes noted over the past 9 months. Currently on a dysphagia 3 diet with thin liquids. Meal completions not documented.   Labs reviewed.   Medications reviewed and include calcium carbonate, Colace, ferrous sulfate, folic acid, mag-ox, Protonix.  NUTRITION - FOCUSED PHYSICAL EXAM:  Unable to complete  Diet Order:   Diet Order             DIET DYS 3 Room service appropriate? Yes; Fluid consistency: Thin  Diet effective now                   EDUCATION NEEDS:   No education needs have been identified at this time  Skin:  Skin Assessment: Reviewed RN Assessment  Last BM:  no BM documented  Height:   Ht Readings from Last 1 Encounters:  09/09/21 5\' 5"  (1.651 m)    Weight:   Wt Readings from Last 1 Encounters:  09/09/21 68.8 kg     BMI:  Body mass index is 25.24 kg/m.  Estimated Nutritional Needs:   Kcal:  1700-1900  Protein:  85-95 gm  Fluid:  1.7-1.9 L   Lucas Mallow RD, LDN, CNSC Please refer to Amion for contact information.

## 2021-09-10 NOTE — Evaluation (Signed)
Occupational Therapy Evaluation Patient Details Name: Tanya Koch MRN: [id number] DOB: 18-May-1928 Today's Date: 09/10/2021   History of Present Illness Pt is a 88 y.o. female with PMH of bradycardia, history of MI, history of unspecified CHF with recovered EF, COVID-19, emphysema, history of hematemesis, hypertension, dementia, history of closed right hip fracture in December 2022 who had an unwitnessed fall and was found on the floor at her SNF facility this morning with inability to stand up and bear weight on her LLE. Pt s/p left hip hemiarthroplasty performed on 07/14.   Clinical Impression   Pt dependent at baseline for ADLs, family reports pt can self feed and only needs assist sometimes. Pt uses w/c at baseline and is dependent with 1 person assist for transfers. Pt currently min-total A +2 for ADLs, min A for bed mobility, and max-total A +2 for squat pivot transfer to chair. Pt HOH needing increased cuing throughout. Pt presenting with impairments listed below, will follow acutely. Recommend SNF at d/c.     Recommendations for follow up therapy are one component of a multi-disciplinary discharge planning process, led by the attending physician.  Recommendations may be updated based on patient status, additional functional criteria and insurance authorization.   Follow Up Recommendations  Skilled nursing-short term rehab (<3 hours/day)    Assistance Recommended at Discharge Frequent or constant Supervision/Assistance  Patient can return home with the following A lot of help with bathing/dressing/bathroom;Two people to help with walking and/or transfers;Assistance with cooking/housework;Assistance with feeding;Direct supervision/assist for medications management;Direct supervision/assist for financial management;Assist for transportation;Help with stairs or ramp for entrance    Functional Status Assessment  Patient has had a recent decline in their functional status and demonstrates  the ability to make significant improvements in function in a reasonable and predictable amount of time.  Equipment Recommendations  None recommended by OT;Other (comment) (defer to next venue of care)    Recommendations for Other Services PT consult     Precautions / Restrictions Precautions Precautions: Fall Precaution Comments: no hip precautions Restrictions Weight Bearing Restrictions: Yes LLE Weight Bearing: Weight bearing as tolerated      Mobility Bed Mobility Overal bed mobility: Needs Assistance Bed Mobility: Supine to Sit     Supine to sit: Min assist     General bed mobility comments: Increased time to complete task. Sling pad utilized for scooting.    Transfers Overall transfer level: Needs assistance Equipment used: Rolling walker (2 wheels), None Transfers: Bed to chair/wheelchair/BSC, Sit to/from Stand Sit to Stand: Max assist, +2 physical assistance, +2 safety/equipment, Total assist   Squat pivot transfers: Total assist       General transfer comment: max -total A +2 standing attempt unsuccessful, able to transfer total A with face to face transfer      Balance Overall balance assessment: Needs assistance Sitting-balance support: Single extremity supported, Bilateral upper extremity supported Sitting balance-Leahy Scale: Fair     Standing balance support: Bilateral upper extremity supported Standing balance-Leahy Scale: Zero                             ADL either performed or assessed with clinical judgement   ADL Overall ADL's : Needs assistance/impaired Eating/Feeding: Sitting;Minimal assistance   Grooming: Minimal assistance;Sitting   Upper Body Bathing: Moderate assistance;Sitting;Standing   Lower Body Bathing: Sitting/lateral leans;Sit to/from stand;Maximal assistance;Total assistance   Upper Body Dressing : Moderate assistance;Sitting;Standing   Lower Body Dressing: Sitting/lateral leans;Sit to/from stand;Maximal  assistance;Total assistance   Toilet Transfer: Total assistance;+2 for physical assistance   Toileting- Clothing Manipulation and Hygiene: Maximal assistance       Functional mobility during ADLs: Total assistance;+2 for physical assistance       Vision   Vision Assessment?: No apparent visual deficits     Perception     Praxis      Pertinent Vitals/Pain Pain Assessment Pain Assessment: Faces Pain Score: 2  Faces Pain Scale: Hurts a little bit Pain Location: L hip Pain Descriptors / Indicators:  (unable to obtain) Pain Intervention(s): Limited activity within patient's tolerance, Monitored during session     Hand Dominance     Extremity/Trunk Assessment Upper Extremity Assessment Upper Extremity Assessment: Generalized weakness   Lower Extremity Assessment Lower Extremity Assessment: Defer to PT evaluation   Cervical / Trunk Assessment Cervical / Trunk Assessment: Kyphotic   Communication Communication Communication: HOH   Cognition Arousal/Alertness: Awake/alert Behavior During Therapy: Flat affect Overall Cognitive Status: History of cognitive impairments - at baseline                                 General Comments: Pt has dementia.     General Comments  VSS on RA, family present in room during session, provides PLOF    Exercises     Shoulder Instructions      Home Living Family/patient expects to be discharged to:: Assisted living                             Home Equipment: Wheelchair - Publishing copy (2 wheels)   Additional Comments: from memory care unit      Prior Functioning/Environment Prior Level of Function : Needs assist;History of Falls (last six months)  Cognitive Assist : Mobility (cognitive);ADLs (cognitive) Mobility (Cognitive): Step by step cues ADLs (Cognitive): Step by step cues Physical Assist : ADLs (physical)   ADLs (physical):  Feeding;Grooming;Bathing;Dressing;Toileting;IADLs Mobility Comments: Pt required max assistance x 1 for transfers. Pt primarily in [place]. Unknown level of assistance for walking.          OT Problem List: Decreased range of motion;Decreased strength;Decreased activity tolerance;Impaired balance (sitting and/or standing);Decreased coordination;Decreased cognition;Decreased safety awareness;Decreased knowledge of use of DME or AE      OT Treatment/Interventions: Self-care/ADL training;Therapeutic exercise;DME and/or AE instruction;Therapeutic activities;Patient/family education;Balance training    OT Goals(Current goals can be found in the care plan section) Acute Rehab OT Goals Patient Stated Goal: none stated OT Goal Formulation: Patient unable to participate in goal setting Time For Goal Achievement: 09/24/21 Potential to Achieve Goals: Fair  OT Frequency: Min 2X/week    Co-evaluation PT/OT/SLP Co-Evaluation/Treatment: Yes Reason for Co-Treatment: For patient/therapist safety;To address functional/ADL transfers   OT goals addressed during session: ADL's and self-care      AM-PAC OT "6 Clicks" Daily Activity     Outcome Measure Help from another person eating meals?: A Little Help from another person taking care of personal grooming?: A Little Help from another person toileting, which includes using toliet, bedpan, or urinal?: Total Help from another person bathing (including washing, rinsing, drying)?: A Lot Help from another person to put on and taking off regular upper body clothing?: A Lot Help from another person to put on and taking off regular lower body clothing?: Total 6 Click Score: 12   End of Session Equipment Utilized During Treatment: Gait belt;Rolling walker (2 wheels)  Nurse Communication: Mobility status;Need for lift equipment (possible use of lift to get pt back to bed)  Activity Tolerance: Patient tolerated treatment well Patient left: in chair;with chair alarm  set;with call bell/phone within reach  OT Visit Diagnosis: Unsteadiness on feet (R26.81);Other abnormalities of gait and mobility (R26.89);Muscle weakness (generalized) (M62.81)                Time: 3074-6002 OT Time Calculation (min): 28 min Charges:  OT General Charges $OT Visit: 1 Visit OT Evaluation $OT Eval Moderate Complexity: 1 [address] OTD, OTR/L Acute Rehab [phone] [phone] - [place] 09/10/2021, 4:16 PM

## 2021-09-10 NOTE — Progress Notes (Signed)
Subjective: 1 Day Post-Op Procedure(s) (LRB): LEFT HIP HEMI ARTHROPLASTY (Left) Patient reports pain as mild.  Pleasantly demented and resting comfortably  Objective: Vital signs in last 24 hours: Temp:  [97 F (36.1 C)-99 F (37.2 C)] 99 F (37.2 C) (07/15 0751) Pulse Rate:  [58-74] 67 (07/15 0751) Resp:  [11-19] 19 (07/15 0751) BP: (90-185)/(53-85) 153/58 (07/15 0751) SpO2:  [79 %-98 %] 98 % (07/15 0532) Weight:  [68.8 kg] 68.8 kg (07/14 2244)  Intake/Output from previous day: 07/14 0701 - 07/15 0700 In: 900.4 [I.V.:800.4; IV Piggyback:100] Out: 2336 [Urine:1525; Blood:50] Intake/Output this shift: No intake/output data recorded.  Recent Labs    09/08/21 1106 09/09/21 0435 09/09/21 1829 09/10/21 0141  HGB 10.5* 9.5* 9.5* 7.8*   Recent Labs    09/09/21 1829 09/10/21 0141  WBC 14.4* 12.8*  RBC 3.05* 2.48*  2.46*  HCT 30.4* 24.4*  PLT 268 241   Recent Labs    09/09/21 0435 09/09/21 1829 09/10/21 0141  NA 144  --  138  K 3.6  --  4.3  CL 111  --  105  CO2 21*  --  21*  BUN 49*  --  55*  CREATININE 2.13* 2.38* 2.46*  GLUCOSE 80  --  142*  CALCIUM 8.4*  --  8.1*   No results for input(s): "LABPT", "INR" in the last 72 hours.  Neurovascular intact Sensation intact distally Intact pulses distally Incision: dressing C/D/I No cellulitis present Compartment soft   Assessment/Plan: 1 Day Post-Op Procedure(s) (LRB): LEFT HIP HEMI ARTHROPLASTY (Left) Up with therapy WBAT LLE ABLA- will defer ? Transfusion to primary team Lovenox/scds for dvt ppx F/u with Dr. Erlinda Hong 2 weeks post-op     Aundra Dubin 09/10/2021, 8:10 AM

## 2021-09-10 NOTE — Hospital Course (Signed)
bradycardia, history of MI, unspecified heart failure, with recovered ejection fraction, COVID-19, emphysema, hypertension,  history of hematemesis, history of right closed right hip fracture December 2022 who had an unwitnessed fall and was found on the floor at her skilled nursing facility, in ED was found to have left hip fracture,CT cervical spine and CT head with no acute abnormality but show extensive calcific atherosclerosis of both carotid bifurcation-underwent carotid Doppler that shows 1 to 39% stenosis.,  Bilateral vertebral arteries demonstrated antegrade flow.  Patient transferred to [facility] underwent left hip hemiarthroplasty by Dr. Erlinda Hong 7/14.

## 2021-09-10 NOTE — Anesthesia Postprocedure Evaluation (Signed)
Anesthesia Post Note  Patient: Tanya Koch  Procedure(s) Performed: LEFT HIP HEMI ARTHROPLASTY (Left: Hip)     Patient location during evaluation: PACU Anesthesia Type: Spinal Level of consciousness: awake, awake and alert and oriented Pain management: pain level controlled Vital Signs Assessment: post-procedure vital signs reviewed and stable Respiratory status: spontaneous breathing, nonlabored ventilation and respiratory function stable Cardiovascular status: blood pressure returned to baseline and stable Postop Assessment: no headache, no backache, spinal receding and no apparent nausea or vomiting Anesthetic complications: no   No notable events documented.  Last Vitals:  Vitals:   09/10/21 0532 09/10/21 0751  BP: (!) 145/65 (!) 153/58  Pulse: 70 67  Resp: 15 19  Temp: 37.1 C 37.2 C  SpO2: 98%     Last Pain:  Vitals:   09/10/21 0829  TempSrc:   PainSc: 0-No pain                 Santa Lighter

## 2021-09-10 NOTE — Progress Notes (Signed)
Pt refused to put on cpap at this time due to being in pain and not being able to sleep.

## 2021-09-11 DIAGNOSIS — S72002A Fracture of unspecified part of neck of left femur, initial encounter for closed fracture: Secondary | ICD-10-CM | POA: Diagnosis not present

## 2021-09-11 LAB — BASIC METABOLIC PANEL
Anion gap: 12 (ref 5–15)
BUN: 57 mg/dL — ABNORMAL HIGH (ref 8–23)
CO2: 21 mmol/L — ABNORMAL LOW (ref 22–32)
Calcium: 8.1 mg/dL — ABNORMAL LOW (ref 8.9–10.3)
Chloride: 104 mmol/L (ref 98–111)
Creatinine, Ser: 2.65 mg/dL — ABNORMAL HIGH (ref 0.44–1.00)
GFR, Estimated: 17 mL/min — ABNORMAL LOW (ref >60)
Glucose, Bld: 117 mg/dL — ABNORMAL HIGH (ref 70–99)
Potassium: 4.1 mmol/L (ref 3.5–5.1)
Sodium: 137 mmol/L (ref 135–145)

## 2021-09-11 LAB — TYPE AND SCREEN
ABO/RH(D): O POS
Antibody Screen: NEGATIVE
Unit division: 0

## 2021-09-11 LAB — BPAM RBC
Blood Product Expiration Date: 202308152359
ISSUE DATE / TIME: 202307151801
Unit Type and Rh: 5100

## 2021-09-11 LAB — CBC
HCT: 25.8 % — ABNORMAL LOW (ref 36.0–46.0)
Hemoglobin: 8.7 g/dL — ABNORMAL LOW (ref 12.0–15.0)
MCH: 31.3 pg (ref 26.0–34.0)
MCHC: 33.7 g/dL (ref 30.0–36.0)
MCV: 92.8 fL (ref 80.0–100.0)
Platelets: 211 10*3/uL (ref 150–400)
RBC: 2.78 MIL/uL — ABNORMAL LOW (ref 3.87–5.11)
RDW: 16.2 % — ABNORMAL HIGH (ref 11.5–15.5)
WBC: 16 10*3/uL — ABNORMAL HIGH (ref 4.0–10.5)
nRBC: 0 % (ref 0.0–0.2)

## 2021-09-11 LAB — GLUCOSE, CAPILLARY: Glucose-Capillary: 104 mg/dL — ABNORMAL HIGH (ref 70–99)

## 2021-09-11 NOTE — Plan of Care (Signed)

## 2021-09-11 NOTE — Progress Notes (Signed)
PROGRESS NOTE Tanya Koch  YOV:[id number] DOB: [birth year] DOA: 09/08/2021 PCP: Earmon Phoenix, NP   Brief Narrative/Hospital Course: bradycardia, history of MI, unspecified heart failure, with recovered ejection fraction, COVID-19, emphysema, hypertension,  history of hematemesis, history of right closed right hip fracture December 2022 who had an unwitnessed fall and was found on the floor at her skilled nursing facility, in ED was found to have left hip fracture,CT cervical spine and CT head with no acute abnormality but show extensive calcific atherosclerosis of both carotid bifurcation-underwent carotid Doppler that shows 1 to 39% stenosis.,  Bilateral vertebral arteries demonstrated antegrade flow.  Patient transferred to [facility] underwent left hip hemiarthroplasty by Dr. Erlinda Hong 7/14.  Underwent 1 unit PRBC transfusion 7/15    Subjective:  Seen and examined this morning.  Alert awake oriented to self, No new complaints  Assessment and Plan: Principal Problem:   Closed left hip fracture, initial encounter [facility]) Active Problems:   Dementia without behavioral disturbance (HCC)   GERD (gastroesophageal reflux disease)   Chronic kidney disease (CKD), stage IV (severe) (HCC)   Hypertension   Emphysema lung (HCC)   Hyperlipidemia   Hypothermia   Normocytic anemia   Leukocytosis   Closed left hip fracture, initial encounter s/p  left hip hemiarthroplasty by Dr. Erlinda Hong 7/14.  Surgically stable orthopedic following, WBAT LLE, DVT prophylaxis with Lovenox/SCD and outpatient follow-up with Dr. Beatriz Chancellor in 2 weeks.  Continue PT OT pain control bowel regimen and further plan as per orthopedics.  Awaiting for placement likely tomorrow  Acute blood loss anemia in the setting of anemia chronic disease from XAJ:OINOMVEHM 1 unit PRBC transfusion 7/15 - hb up in 8.7. monitor.  Recent Labs  Lab 09/08/21 1106 09/09/21 0435 09/09/21 1829 09/10/21 0141 09/11/21 0228  HGB 10.5* 9.5* 9.5* 7.8* 8.7*  HCT 32.6*  29.2* 30.4* 24.4* 25.8*   Leukocytosis likely reactive in the setting of hip fracture, overnight up at 16 K.  Recheck in a.m. Recent Labs  Lab 09/08/21 1106 09/09/21 0435 09/09/21 1829 09/10/21 0141 09/11/21 0228  WBC 14.0* 11.1* 14.4* 12.8* 16.0*    Dementia without behavioral disturbance: Continue supportive care fall precaution delirium precaution continue Lexapro GERD-continue PPI CKD stage IV creatinine around the baseline. resume Lasix in am Essential hypertension stable on amlodipine.  Hypothermia on admission resolved, chest x-ray UA unremarkable continue warm blanket, TSH euthyroid Emphysema lung-encourage incentive spirometry Bilateral carotid artery disease- ruled on. Seen in CT imaging but Doppler neck unremarkable.  DVT prophylaxis: enoxaparin (LOVENOX) injection 30 mg Start: 09/10/21 0800 SCDs Start: 09/09/21 1712 Place TED hose Start: 09/09/21 1712 SCDs Start: 09/08/21 1108 Code Status:   Code Status: DNR Family Communication: plan of care discussed with patient at bedside. Patient status is: Inpatient because of pos op management Level of care: Telemetry Medical   Dispo: The patient is from: SNF            Anticipated disposition:  snf  once available  Mobility Assessment (last 72 hours)     Mobility Assessment     Row Name 09/10/21 2300 09/10/21 1600 09/10/21 1400 09/09/21 22:47:42     Does patient have an order for bedrest or is patient medically unstable No - Continue assessment -- -- No - Continue assessment    What is the highest level of mobility based on the progressive mobility assessment? Level 2 (Chairfast) - Balance while sitting on edge of bed and cannot stand Level 2 (Chairfast) - Balance while sitting on edge of bed and  cannot stand Level 3 (Stands with assist) - Balance while standing  and cannot march in place Level 1 (Bedfast) - Unable to balance while sitting on edge of bed    Is the above level different from baseline mobility prior to  current illness? Yes - Recommend PT order -- -- Yes - Recommend PT order              Objective: Vitals last 24 hrs: Vitals:   09/10/21 2130 09/11/21 0412 09/11/21 0417 09/11/21 0742  BP: (!) 144/75  (!) 161/57 (!) 164/58  Pulse: 68 65 63 60  Resp:   16 16  Temp: 98.7 F (37.1 C)  98.5 F (36.9 C) 98.8 F (37.1 C)  TempSrc: Oral  Oral Oral  SpO2: 94% (!) 88% 91% 90%  Weight:      Height:       Weight change:   Physical Examination: General exam: Aaox1-2, older than stated age, weak appearing. HEENT:Oral mucosa moist, Ear/Nose WNL grossly, dentition normal. Respiratory system: bilaterally clear, no use of accessory muscle Cardiovascular system: S1 & S2 +, No JVD,. Gastrointestinal system: Abdomen soft,NT,ND,BS+ Nervous System:Alert, awake, moving extremities and grossly nonfocal Extremities: Left hip surgical site with Aquacel dressing in place c/d/i, distal peripheral pulses palpable.  Skin: No rashes,no icterus. MSK: Normal muscle bulk,tone, power   Medications reviewed:  Scheduled Meds:  ALPRAZolam  0.25 mg Oral BID   amLODipine  10 mg Oral Daily   calcium carbonate  1,000 mg Oral Daily   Chlorhexidine Gluconate Cloth  6 each Topical Q0600   docusate sodium  100 mg Oral BID   enoxaparin (LOVENOX) injection  30 mg Subcutaneous Q24H   escitalopram  10 mg Oral Daily   feeding supplement  237 mL Oral BID BM   ferrous sulfate  325 mg Oral Q breakfast   folic acid  1 mg Oral QODAY   magnesium oxide  400 mg Oral BID   multivitamin with minerals  1 tablet Oral Daily   mupirocin ointment  1 Application Nasal BID   pantoprazole  40 mg Oral Daily   rosuvastatin  10 mg Oral QHS   tranexamic acid (CYKLOKAPRON) 2,000 mg in sodium chloride 0.9 % 50 mL Topical Application  8,416 mg Topical Once   Continuous Infusions:  methocarbamol (ROBAXIN) IV        Diet Order             DIET DYS 3 Room service appropriate? Yes; Fluid consistency: Thin  Diet effective now                     Nutrition Problem: Increased nutrient needs Etiology: hip fracture, post-op healing Signs/Symptoms: estimated needs Interventions: Ensure Enlive (each supplement provides 350kcal and 20 grams of protein), MVI   Intake/Output Summary (Last 24 hours) at 09/11/2021 0805 Last data filed at 09/11/2021 0300 Gross per 24 hour  Intake 530 ml  Output --  Net 530 ml   Net IO Since Admission: 855.41 mL [09/11/21 0805]  Wt Readings from Last 3 Encounters:  09/09/21 68.8 kg  02/10/21 68.5 kg  02/07/21 67.9 kg     Unresulted Labs (From admission, onward)     Start     Ordered   09/16/21 0500  Creatinine, serum  (enoxaparin (LOVENOX)  CrCl >/= 30 mL/min  )  Weekly,   R     Comments: while on enoxaparin therapy.    09/09/21 1712   09/11/21 6063  Basic metabolic  panel  Daily at 5am,   R      09/10/21 0835           Data Reviewed: I have personally reviewed following labs and imaging studies CBC: Recent Labs  Lab 09/08/21 1106 09/09/21 0435 09/09/21 1829 09/10/21 0141 09/11/21 0228  WBC 14.0* 11.1* 14.4* 12.8* 16.0*  NEUTROABS 11.4*  --   --   --   --   HGB 10.5* 9.5* 9.5* 7.8* 8.7*  HCT 32.6* 29.2* 30.4* 24.4* 25.8*  MCV 98.5 99.7 99.7 98.4 92.8  PLT 240 232 268 241 462   Basic Metabolic Panel: Recent Labs  Lab 09/08/21 1106 09/09/21 0435 09/09/21 1829 09/10/21 0141 09/11/21 0228  NA 139 144  --  138 137  K 3.9 3.6  --  4.3 4.1  CL 106 111  --  105 104  CO2 23 21*  --  21* 21*  GLUCOSE 121* 80  --  142* 117*  BUN 53* 49*  --  55* 57*  CREATININE 2.44* 2.13* 2.38* 2.46* 2.65*  CALCIUM 9.0 8.4*  --  8.1* 8.1*   GFR: Estimated Creatinine Clearance: 13.5 mL/min (A) (by C-G formula based on SCr of 2.65 mg/dL (H)). Liver Function Tests: Recent Labs  Lab 09/09/21 0435  AST 23  ALT 16  ALKPHOS 42  BILITOT 0.9  PROT 6.2*  ALBUMIN 2.6*   No results for input(s): "LIPASE", "AMYLASE" in the last 168 hours. No results for input(s): "AMMONIA" in  the last 168 hours. Coagulation Profile: No results for input(s): "INR", "PROTIME" in the last 168 hours. BNP (last 3 results) No results for input(s): "PROBNP" in the last 8760 hours. HbA1C: No results for input(s): "HGBA1C" in the last 72 hours. CBG: No results for input(s): "GLUCAP" in the last 168 hours. Lipid Profile: No results for input(s): "CHOL", "HDL", "LDLCALC", "TRIG", "CHOLHDL", "LDLDIRECT" in the last 72 hours. Thyroid Function Tests: Recent Labs    09/09/21 1829  TSH 1.202   Sepsis Labs: No results for input(s): "PROCALCITON", "LATICACIDVEN" in the last 168 hours.  Recent Results (from the past 240 hour(s))  Surgical pcr screen     Status: Abnormal   Collection Time: 09/09/21  9:59 AM   Specimen: Nasal Mucosa; Nasal Swab  Result Value Ref Range Status   MRSA, PCR NEGATIVE NEGATIVE Final   Staphylococcus aureus POSITIVE (A) NEGATIVE Final    Comment: (NOTE) The Xpert SA Assay (FDA approved for NASAL specimens in patients 57 years of age and older), is one component of a comprehensive surveillance program. It is not intended to diagnose infection nor to guide or monitor treatment. Performed at [facility], [place] [address] [place], [place] [address]     Antimicrobials: Anti-infectives (From admission, onward)    Start     Dose/Rate Route Frequency Ordered Stop   09/09/21 1800  ceFAZolin (ANCEF) IVPB 2g/100 mL premix        2 g 200 mL/hr over 30 Minutes Intravenous Every 6 hours 09/09/21 1712 09/10/21 2242   09/09/21 1223  vancomycin (VANCOCIN) powder  Status:  Discontinued          As needed 09/09/21 1223 09/09/21 1324   09/09/21 1045  ceFAZolin (ANCEF) IVPB 2g/100 mL premix        2 g 200 mL/hr over 30 Minutes Intravenous On call to O.R. 09/09/21 1044 09/09/21 1151      Culture/Microbiology No results found for: "SDES", "SPECREQUEST", "CULT", "REPTSTATUS"  Other culture-see note  Radiology  Studies: Pelvis Portable  Result Date:  09/09/2021 CLINICAL DATA:  A 88 year old presents for follow-up following hip arthroplasty. EXAM: PORTABLE PELVIS 1-2 VIEWS COMPARISON:  CT of the pelvis of July of 2013. FINDINGS: Immediate postoperative appearance of the LEFT hip following hip arthroplasty with no unexpected radiographic abnormality. Soft tissue gas over the LEFT hip. Osteopenia without signs of complication on AP views. Incidental note is made again of fracture of the RIGHT greater trochanter which is a chronic finding. IMPRESSION: 1. Immediate postoperative appearance following LEFT hip arthroplasty without signs of complication on AP views. 2. Chronic RIGHT greater trochanter fracture. Electronically Signed   By: Zetta Bills M.D.   On: 09/09/2021 13:45   DG HIP UNILAT WITH PELVIS 1V LEFT  Result Date: 09/09/2021 CLINICAL DATA:  Fracture left hip, fluoroscopic assistance for left hip arthroplasty EXAM: DG HIP (WITH OR WITHOUT PELVIS) 1V*L* COMPARISON:  09/08/2021 FINDINGS: Fluoroscopic images show left hip arthroplasty. Fluoroscopic time was 9 seconds. Radiation dose 0.34 mGy. IMPRESSION: Fluoroscopic assistance was provided for left hip arthroplasty. Electronically Signed   By: Elmer Picker M.D.   On: 09/09/2021 12:55   DG C-Arm 1-60 Min-No Report  Result Date: 09/09/2021 Fluoroscopy was utilized by the requesting physician.  No radiographic interpretation.     LOS: 3 days   Antonieta Pert, MD [facility]  09/11/2021, 8:05 AM

## 2021-09-11 NOTE — Plan of Care (Signed)
  Problem: Clinical Measurements: Goal: Respiratory complications will improve Outcome: Progressing Goal: Cardiovascular complication will be avoided Outcome: Progressing   

## 2021-09-11 NOTE — Progress Notes (Signed)
Subjective: 2 Days Post-Op Procedure(s) (LRB): LEFT HIP HEMI ARTHROPLASTY (Left) Patient reports pain as  resting comfortably .    Objective: Vital signs in last 24 hours: Temp:  [98.1 F (36.7 C)-98.8 F (37.1 C)] 98.8 F (37.1 C) (07/16 0742) Pulse Rate:  [60-69] 60 (07/16 0742) Resp:  [15-20] 16 (07/16 0742) BP: (141-177)/(41-75) 164/58 (07/16 0742) SpO2:  [88 %-99 %] 90 % (07/16 0742)  Intake/Output from previous day: 07/15 0701 - 07/16 0700 In: 530 [P.O.:120; Blood:410] Out: -  Intake/Output this shift: No intake/output data recorded.  Recent Labs    09/08/21 1106 09/09/21 0435 09/09/21 1829 09/10/21 0141 09/11/21 0228  HGB 10.5* 9.5* 9.5* 7.8* 8.7*   Recent Labs    09/10/21 0141 09/11/21 0228  WBC 12.8* 16.0*  RBC 2.48*  2.46* 2.78*  HCT 24.4* 25.8*  PLT 241 211   Recent Labs    09/10/21 0141 09/11/21 0228  NA 138 137  K 4.3 4.1  CL 105 104  CO2 21* 21*  BUN 55* 57*  CREATININE 2.46* 2.65*  GLUCOSE 142* 117*  CALCIUM 8.1* 8.1*   No results for input(s): "LABPT", "INR" in the last 72 hours.  Incision: dressing C/D/I No cellulitis present Compartment soft    Assessment/Plan: 2 Days Post-Op Procedure(s) (LRB): LEFT HIP HEMI ARTHROPLASTY (Left) Up with therapy Weightbearing: WBAT LLE Insicional and dressing care: Dressings left intact until follow-up Orthopedic device(s): None Showering: ok to shower with bandage pod #3 VTE prophylaxis: Lovenox 40mg  qd  2 weeks Pain control: norco  Follow - up plan: 2 weeks Contact information: michale xu MD, Mendel Ryder Venida Jarvis PA       Aundra Dubin 09/11/2021, 8:34 AM

## 2021-09-12 ENCOUNTER — Encounter (HOSPITAL_COMMUNITY): Payer: Self-pay | Admitting: Orthopaedic Surgery

## 2021-09-12 DIAGNOSIS — S72002A Fracture of unspecified part of neck of left femur, initial encounter for closed fracture: Secondary | ICD-10-CM | POA: Diagnosis not present

## 2021-09-12 LAB — BASIC METABOLIC PANEL
Anion gap: 9 (ref 5–15)
BUN: 57 mg/dL — ABNORMAL HIGH (ref 8–23)
CO2: 21 mmol/L — ABNORMAL LOW (ref 22–32)
Calcium: 8.1 mg/dL — ABNORMAL LOW (ref 8.9–10.3)
Chloride: 107 mmol/L (ref 98–111)
Creatinine, Ser: 2.56 mg/dL — ABNORMAL HIGH (ref 0.44–1.00)
GFR, Estimated: 17 mL/min — ABNORMAL LOW (ref >60)
Glucose, Bld: 116 mg/dL — ABNORMAL HIGH (ref 70–99)
Potassium: 4.4 mmol/L (ref 3.5–5.1)
Sodium: 137 mmol/L (ref 135–145)

## 2021-09-12 LAB — CBC
HCT: 25.8 % — ABNORMAL LOW (ref 36.0–46.0)
Hemoglobin: 8.3 g/dL — ABNORMAL LOW (ref 12.0–15.0)
MCH: 31.1 pg (ref 26.0–34.0)
MCHC: 32.2 g/dL (ref 30.0–36.0)
MCV: 96.6 fL (ref 80.0–100.0)
Platelets: 196 10*3/uL (ref 150–400)
RBC: 2.67 MIL/uL — ABNORMAL LOW (ref 3.87–5.11)
RDW: 16.5 % — ABNORMAL HIGH (ref 11.5–15.5)
WBC: 13.5 10*3/uL — ABNORMAL HIGH (ref 4.0–10.5)
nRBC: 0 % (ref 0.0–0.2)

## 2021-09-12 NOTE — Progress Notes (Signed)
Physical Therapy Treatment Patient Details Name: Tanya Koch MRN: [id number] DOB: Jun 18, 1928 Today's Date: 09/12/2021   History of Present Illness Pt is a 88 y.o. female with PMH of bradycardia, history of MI, history of unspecified CHF with recovered EF, COVID-19, emphysema, history of hematemesis, hypertension, dementia, history of closed right hip fracture in December 2022 who had an unwitnessed fall and was found on the floor at her SNF facility this morning with inability to stand up and bear weight on her LLE. Pt s/p left hip hemiarthroplasty performed on 07/14.    PT Comments    Pt agreeable to work with PT with some encouragement. Requiring modA for bed mobility and maxA +2 for STS trials. Pt requiring less assist with STS using RW versus stedy. Remains unsafe for higher level transfers and ambulation due to decreased strength and balance. SNF remains most appropriate discharge disposition due to pts current required assist levels and limitations due to decreased cognition.    Recommendations for follow up therapy are one component of a multi-disciplinary discharge planning process, led by the attending physician.  Recommendations may be updated based on patient status, additional functional criteria and insurance authorization.  Follow Up Recommendations  Skilled nursing-short term rehab (<3 hours/day) Can patient physically be transported by private vehicle: No   Assistance Recommended at Discharge Frequent or constant Supervision/Assistance  Patient can return home with the following A lot of help with walking and/or transfers;A lot of help with bathing/dressing/bathroom;Assistance with cooking/housework;Assistance with feeding;Assist for transportation;Direct supervision/assist for financial management;Direct supervision/assist for medications management   Equipment Recommendations  None recommended by PT    Recommendations for Other Services       Precautions /  Restrictions Precautions Precautions: Fall Restrictions Weight Bearing Restrictions: Yes LLE Weight Bearing: Weight bearing as tolerated     Mobility  Bed Mobility Overal bed mobility: Needs Assistance Bed Mobility: Supine to Sit     Supine to sit: Mod assist     General bed mobility comments: pt able to initiate with increased time, HOB elevated, bed pad and LLE assist    Transfers Overall transfer level: Needs assistance Equipment used: Rolling walker (2 wheels), Ambulation equipment used Transfers: Sit to/from Stand, Bed to chair/wheelchair/BSC Sit to Stand: Max assist, +2 physical assistance           General transfer comment: 1st trial with RW, pt able to assist with power up but requiring max+2 to achieve full stand. Noted increase LLE valgus in standing. Pt with decreased assistance with stedy trial requiring maxA throughout and still unable to achieve full stand with B knee flexion and trunk flexion Transfer via Lift Equipment: Stedy  Ambulation/Gait                   Stairs             Wheelchair Mobility    Modified Rankin (Stroke Patients Only)       Balance Overall balance assessment: Needs assistance Sitting-balance support: Bilateral upper extremity supported, Feet supported Sitting balance-Leahy Scale: Fair     Standing balance support: Bilateral upper extremity supported Standing balance-Leahy Scale: Zero                              Cognition Arousal/Alertness: Awake/alert Behavior During Therapy: Flat affect Overall Cognitive Status: History of cognitive impairments - at baseline  General Comments: hx of dementia        Exercises      General Comments        Pertinent Vitals/Pain Pain Assessment Pain Assessment: Faces Faces Pain Scale: Hurts little more Pain Location: L hip Pain Descriptors / Indicators: Discomfort, Guarding Pain Intervention(s):  Limited activity within patient's tolerance, Monitored during session    Home Living                          Prior Function            PT Goals (current goals can now be found in the care plan section) Acute Rehab PT Goals Patient Stated Goal: unable to state PT Goal Formulation: With patient Time For Goal Achievement: 09/16/21 Potential to Achieve Goals: Good Progress towards PT goals: Progressing toward goals    Frequency    Min 3X/week      PT Plan Current plan remains appropriate    Co-evaluation              AM-PAC PT "6 Clicks" Mobility   Outcome Measure  Help needed turning from your back to your side while in a flat bed without using bedrails?: A Little Help needed moving from lying on your back to sitting on the side of a flat bed without using bedrails?: A Lot Help needed moving to and from a bed to a chair (including a wheelchair)?: Total Help needed standing up from a chair using your arms (e.g., wheelchair or bedside chair)?: Total Help needed to walk in hospital room?: Total Help needed climbing 3-5 steps with a railing? : Total 6 Click Score: 9    End of Session Equipment Utilized During Treatment: Gait belt Activity Tolerance: Patient tolerated treatment well Patient left: in chair;with call bell/phone within reach;with chair alarm set (lift pad underneath) Nurse Communication: Mobility status;Need for lift equipment PT Visit Diagnosis: Other abnormalities of gait and mobility (R26.89);Repeated falls (R29.6);Muscle weakness (generalized) (M62.81);Pain Pain - Right/Left: Left Pain - part of body: Hip     Time: 1144-1200 PT Time Calculation (min) (ACUTE ONLY): 16 min  Charges:  $Therapeutic Activity: 8-22 mins                     Mackie Pai, SPT Acute Rehabilitation Services  Office: [phone]    Mackie Pai 09/12/2021, 12:39 PM

## 2021-09-12 NOTE — Plan of Care (Signed)

## 2021-09-12 NOTE — Care Management Important Message (Signed)
Important Message  Patient Details  Name: Tanya Koch MRN: [id number] Date of Birth: [birth year]   Medicare Important Message Given:  Yes     Wilder Kurowski Montine Circle 09/12/2021, 3:47 PM

## 2021-09-12 NOTE — Plan of Care (Signed)
  Problem: Education: Goal: Knowledge of General Education information will improve Description: Including pain rating scale, medication(s)/side effects and non-pharmacologic comfort measures Outcome: Progressing   Problem: Clinical Measurements: Goal: Ability to maintain clinical measurements within normal limits will improve Outcome: Progressing Goal: Cardiovascular complication will be avoided Outcome: Progressing    Problem: Activity: Goal: Risk for activity intolerance will decrease Outcome: Progressing   Problem: Nutrition: Goal: Adequate nutrition will be maintained Outcome: Progressing   Problem: Coping: Goal: Level of anxiety will decrease Outcome: Progressing     

## 2021-09-12 NOTE — NC FL2 (Signed)
[place] LEVEL OF CARE SCREENING TOOL     IDENTIFICATION  Patient Name: Tanya Koch Birthdate: [birth year] Sex: female Admission Date (Current Location): 09/08/2021  [facility] and [place] Number:  Herbalist and Address:  [facility], [place] [address], [place], [place] [address]      Provider Number: [id number]  Attending Physician Name and Address:  Antonieta Pert, MD  Relative Name and Phone Number:  Zarra, Geffert [phone]    Current Level of Care: Hospital Recommended Level of Care: [place] Prior Approval Number:    Date Approved/Denied:   PASRR Number: [id number]  Discharge Plan: SNF    Current Diagnoses: Patient Active Problem List   Diagnosis Date Noted   Closed left hip fracture, initial encounter ([place]) 09/08/2021   Hypertension 09/08/2021   Emphysema lung ([place]) 09/08/2021   Hyperlipidemia 09/08/2021   Hypothermia 09/08/2021   Normocytic anemia 09/08/2021   Leukocytosis 09/08/2021   Closed right hip fracture, sequela 02/10/2021   Dementia without behavioral disturbance ([place]) 12/05/2020   GERD (gastroesophageal reflux disease) 12/05/2020   Chronic kidney disease (CKD), stage IV (severe) ([place]) 07/30/2020    Orientation RESPIRATION BLADDER Height & Weight     Self  Normal Incontinent Weight: 151 lb 10.8 oz (68.8 kg) Height:  5\' 5"  (165.1 cm)  BEHAVIORAL SYMPTOMS/MOOD NEUROLOGICAL BOWEL NUTRITION STATUS      Continent Diet (see discharge summary)  AMBULATORY STATUS COMMUNICATION OF NEEDS Skin   Total Care Verbally Surgical wounds                       Personal Care Assistance Level of Assistance  Bathing, Feeding, Dressing, Total care Bathing Assistance: Maximum assistance Feeding assistance: Limited assistance Dressing Assistance: Maximum assistance Total Care Assistance: Maximum assistance   Functional Limitations Info  Sight, Hearing, Speech Sight Info: Adequate Hearing Info:  Impaired Speech Info: Adequate    SPECIAL CARE FACTORS FREQUENCY  PT (By licensed PT), OT (By licensed OT)     PT Frequency: 5x week OT Frequency: 5x week            Contractures Contractures Info: Not present    Additional Factors Info  Code Status, Allergies Code Status Info: DNR Allergies Info: NKA           Current Medications (09/12/2021):  This is the current hospital active medication list Current Facility-Administered Medications  Medication Dose Route Frequency Provider Last Rate Last Admin   acetaminophen (TYLENOL) tablet 325-650 mg  325-650 mg Oral Q6H PRN Leandrew Koyanagi, MD       ALPRAZolam Duanne Moron) tablet 0.25 mg  0.25 mg Oral BID Leandrew Koyanagi, MD   0.25 mg at 09/12/21 0924   alum & mag hydroxide-simeth (MAALOX/MYLANTA) 200-200-20 MG/5ML suspension 30 mL  30 mL Oral Q4H PRN Leandrew Koyanagi, MD       amLODipine (NORVASC) tablet 10 mg  10 mg Oral Daily Leandrew Koyanagi, MD   10 mg at 09/12/21 4142   calcium carbonate (TUMS - dosed in mg elemental calcium) chewable tablet 1,000 mg  1,000 mg Oral Daily Leandrew Koyanagi, MD   1,000 mg at 09/12/21 3953   Chlorhexidine Gluconate Cloth 2 % PADS 6 each  6 each Topical Q0600 Regalado, Belkys A, MD   6 each at 09/12/21 0530   docusate sodium (COLACE) capsule 100 mg  100 mg Oral BID Leandrew Koyanagi, MD   100 mg at 09/12/21 [phone]  enoxaparin (LOVENOX) injection 30 mg  30 mg Subcutaneous Q24H Regalado, Belkys A, MD   30 mg at 09/12/21 0924   escitalopram (LEXAPRO) tablet 10 mg  10 mg Oral Daily Leandrew Koyanagi, MD   10 mg at 09/12/21 0924   feeding supplement (ENSURE ENLIVE / ENSURE PLUS) liquid 237 mL  237 mL Oral BID BM Leandrew Koyanagi, MD   237 mL at 09/12/21 6160   ferrous sulfate tablet 325 mg  325 mg Oral Q breakfast Leandrew Koyanagi, MD   325 mg at 73/71/06 2694   folic acid (FOLVITE) tablet 1 mg  1 mg Oral Geanie Cooley, MD   1 mg at 09/11/21 0954   HYDROcodone-acetaminophen (NORCO) 7.5-325 MG per tablet 1-2 tablet  1-2 tablet Oral  Q4H PRN Leandrew Koyanagi, MD   1 tablet at 09/12/21 8546   HYDROcodone-acetaminophen (NORCO/VICODIN) 5-325 MG per tablet 1-2 tablet  1-2 tablet Oral Q4H PRN Leandrew Koyanagi, MD       HYDROmorphone (DILAUDID) injection 0.5 mg  0.5 mg Intravenous Q2H PRN Leandrew Koyanagi, MD   0.5 mg at 09/10/21 2243   magnesium oxide (MAG-OX) tablet 400 mg  400 mg Oral BID Leandrew Koyanagi, MD   400 mg at 09/12/21 2703   menthol-cetylpyridinium (CEPACOL) lozenge 3 mg  1 lozenge Oral PRN Leandrew Koyanagi, MD       Or   phenol (CHLORASEPTIC) mouth spray 1 spray  1 spray Mouth/Throat PRN Leandrew Koyanagi, MD       methocarbamol (ROBAXIN) tablet 500 mg  500 mg Oral Q6H PRN Leandrew Koyanagi, MD   500 mg at 09/11/21 2137   Or   methocarbamol (ROBAXIN) 500 mg in dextrose 5 % 50 mL IVPB  500 mg Intravenous Q6H PRN Leandrew Koyanagi, MD       morphine (PF) 2 MG/ML injection 0.5-1 mg  0.5-1 mg Intravenous Q2H PRN Leandrew Koyanagi, MD       multivitamin with minerals tablet 1 tablet  1 tablet Oral Daily Kc, Ramesh, MD   1 tablet at 09/12/21 0924   mupirocin ointment (BACTROBAN) 2 % 1 Application  1 Application Nasal BID Regalado, Belkys A, MD   1 Application at 50/09/38 0925   ondansetron (ZOFRAN) tablet 4 mg  4 mg Oral Q6H PRN Leandrew Koyanagi, MD       Or   ondansetron [facility]) injection 4 mg  4 mg Intravenous Q6H PRN Leandrew Koyanagi, MD       Oral care mouth rinse  15 mL Mouth Rinse PRN Kc, Ramesh, MD       oxyCODONE (Oxy IR/ROXICODONE) immediate release tablet 2.5 mg  2.5 mg Oral Q3H PRN Leandrew Koyanagi, MD       pantoprazole (PROTONIX) EC tablet 40 mg  40 mg Oral Daily Leandrew Koyanagi, MD   40 mg at 09/12/21 0924   polyethylene glycol (MIRALAX / GLYCOLAX) packet 17 g  17 g Oral Daily PRN Leandrew Koyanagi, MD       rosuvastatin (CRESTOR) tablet 10 mg  10 mg Oral QHS Leandrew Koyanagi, MD   10 mg at 09/11/21 2137   sorbitol 70 % solution 30 mL  30 mL Oral Daily PRN Leandrew Koyanagi, MD       tranexamic acid (CYKLOKAPRON) 2,000 mg in sodium chloride 0.9 % 50 mL  Topical Application  1,829 mg Topical Once Leandrew Koyanagi, MD  Discharge Medications: Please see discharge summary for a list of discharge medications.  Relevant Imaging Results:  Relevant Lab Results:   Additional Information SSN: [id number], pt is vaccinated for covid with one booster.  Joanne Chars, LCSW

## 2021-09-12 NOTE — TOC Initial Note (Signed)
Transition of Care [facility]) - Initial/Assessment Note    Patient Details  Name: Tanya Koch MRN: [id number] Date of Birth: 22-Oct-1928  [facility] of [facility]) CM/SW Contact:    Joanne Chars, LCSW Phone Number: 09/12/2021, 11:51 AM  Clinical Narrative:    Pt oriented x1, CSW spoke with son Tom by phone.  Pt is from [facility] care but they are considering a move to a different memory care.  Discussed PT recommendation for SNF and he would like to move forward with SNF plan.  Pt is in a wheelchair at baseline, [facility] has been providing PT and pt has been able to walk some with a walker during those sessions.  Pt is vaccinated for covid with one booster.   CSW later spoke with DIL Marcie Bal in room.  Choice document provided.               Expected Discharge Plan: Skilled Nursing Facility Barriers to Discharge: SNF Pending bed offer   Patient Goals and CMS Choice   CMS Medicare.gov Compare Post Acute Care list provided to:: Patient Represenative (must comment) Choice offered to / list presented to : Adult Children (daughter in law, Marcie Bal)  Expected Discharge Plan and Services Expected Discharge Plan: [place] In-house Referral: Clinical Social Work   Post Acute Care Choice: [place] Living arrangements for the past 2 months: [place] Educational psychologist)                                      Prior Living Arrangements/Services Living arrangements for the past 2 months: [place] [facility] psychologist) Lives with:: Facility Resident Patient language and need for interpreter reviewed:: Yes        Need for Family Participation in Patient Care: Yes (Comment) Care giver support system in place?: Yes (comment) Current home services: Home PT [facility]) Criminal Activity/Legal Involvement Pertinent to Current Situation/Hospitalization: No - Comment as needed  Activities of Daily Living Home Assistive  Devices/Equipment: Other (Comment) (pt unable to answer, pt comes from [place] [facility] [place]) ADL Screening (condition at time of admission) Patient's cognitive ability adequate to safely complete daily activities?: No Is the patient deaf or have difficulty hearing?: Yes Does the patient have difficulty seeing, even when wearing glasses/contacts?: No Does the patient have difficulty concentrating, remembering, or making decisions?: Yes Patient able to express need for assistance with ADLs?: Yes Does the patient have difficulty dressing or bathing?: Yes Independently performs ADLs?: No Communication: Independent Dressing (OT): Needs assistance Is this a change from baseline?: Pre-admission baseline Grooming: Needs assistance Is this a change from baseline?: Pre-admission baseline Feeding: Independent Bathing: Needs assistance Is this a change from baseline?: Pre-admission baseline Toileting: Needs assistance Is this a change from baseline?: Pre-admission baseline In/Out Bed: Needs assistance Is this a change from baseline?: Pre-admission baseline Walks in Home: Dependent Is this a change from baseline?: Change from baseline, expected to last >3 days Does the patient have difficulty walking or climbing stairs?: Yes Weakness of Legs: Both ([place] left hip and right knee in fall per computer notes) Weakness of Arms/Hands: None  Permission Sought/Granted                  Emotional Assessment Appearance:: Appears stated age Attitude/Demeanor/Rapport: Engaged Affect (typically observed): Pleasant Orientation: : Oriented to Self Alcohol / Substance Use: Not Applicable Psych Involvement: No (comment)  Admission diagnosis:  Fall,  initial encounter [W19.XXXA] Closed left hip fracture, initial encounter [facility]) [S72.002A] Patient Active Problem List   Diagnosis Date Noted   Closed left hip fracture, initial encounter ([place]) 09/08/2021   Hypertension 09/08/2021   Emphysema lung ([place])  09/08/2021   Hyperlipidemia 09/08/2021   Hypothermia 09/08/2021   Normocytic anemia 09/08/2021   Leukocytosis 09/08/2021   Closed right hip fracture, sequela 02/10/2021   Dementia without behavioral disturbance ([place]) 12/05/2020   GERD (gastroesophageal reflux disease) 12/05/2020   Chronic kidney disease (CKD), stage IV (severe) ([place]) 07/30/2020   PCP:  Earmon Phoenix, NP Pharmacy:   [facility] [place] [address] Phone: [phone] Fax: [phone]     Social Determinants of Health (SDOH) Interventions    Readmission Risk Interventions     No data to display

## 2021-09-12 NOTE — Progress Notes (Signed)
PROGRESS NOTE Tanya Koch  WUJ:[id number] DOB: 1928/06/10 DOA: 09/08/2021 PCP: Earmon Phoenix, NP   Brief Narrative/Hospital Course: bradycardia, history of MI, unspecified heart failure, with recovered ejection fraction, COVID-19, emphysema, hypertension,  history of hematemesis, history of right closed right hip fracture December 2022 who had an unwitnessed fall and was found on the floor at her skilled nursing facility, in ED was found to have left hip fracture,CT cervical spine and CT head with no acute abnormality but show extensive calcific atherosclerosis of both carotid bifurcation-underwent carotid Doppler that shows 1 to 39% stenosis.,  Bilateral vertebral arteries demonstrated antegrade flow.  Patient transferred to [facility] underwent left hip hemiarthroplasty by Dr. Erlinda Hong 7/14.  Underwent 1 unit PRBC transfusion 7/15.Patient remains medically stable.  CKD with a stable renal function leukocytosis downtrending hemoglobin is stable >8 g. Stable for SNF.    Subjective: Seen and examined this morning.  Resting comfortably, pleasantly confused with baseline dementia Afebrile WBC downtrending.  Assessment and Plan: Principal Problem:   Closed left hip fracture, initial encounter [facility]) Active Problems:   Dementia without behavioral disturbance (HCC)   GERD (gastroesophageal reflux disease)   Chronic kidney disease (CKD), stage IV (severe) (HCC)   Hypertension   Emphysema lung (HCC)   Hyperlipidemia   Hypothermia   Normocytic anemia   Leukocytosis   Closed left hip fracture, initial encounter s/p  left hip hemiarthroplasty by Dr. Erlinda Hong 7/14.  Surgically stable orthopedic following, WBAT LLE, DVT prophylaxis with Lovenox/SCD and outpatient follow-up with Dr. Erlinda Hong in 2 weeks.  Continue PT OT pain control bowel regimen and further plan as per orthopedics.  Awaiting for placement likely tomorrow  Acute blood loss anemia in the setting of anemia chronic disease from NFA:[id number] 1 unit PRBC  transfusion 7/15 - hb up in 8.7> 8.3. monitor.  Recent Labs  Lab 09/09/21 0435 09/09/21 1829 09/10/21 0141 09/11/21 0228 09/12/21 0142  HGB 9.5* 9.5* 7.8* 8.7* 8.3*  HCT 29.2* 30.4* 24.4* 25.8* 25.8*   Leukocytosis likely reactive in the setting of hip fracture, downtrending. Recent Labs  Lab 09/09/21 0435 09/09/21 1829 09/10/21 0141 09/11/21 0228 09/12/21 0142  WBC 11.1* 14.4* 12.8* 16.0* 13.5*    Dementia without behavioral disturbance: Pleasantly confused, continue supportive care fall precaution delirium precaution continue Lexapro GERD-on PPI CKD stage IV creatinine around the baseline.  Resume Lasix Essential hypertension stable on amlodipine.  Hypothermia on admission resolved, chest x-ray UA unremarkable continue warm blanket, TSH euthyroid Emphysema lung-encourage incentive spirometry Bilateral carotid artery disease- ruled on. Seen in CT imaging but Doppler neck unremarkable.  DVT prophylaxis: enoxaparin (LOVENOX) injection 30 mg Start: 09/10/21 0800 SCDs Start: 09/09/21 1712 Place TED hose Start: 09/09/21 1712 SCDs Start: 09/08/21 1108 Code Status:   Code Status: DNR Family Communication: plan of care discussed with patient at bedside. Patient status is: Inpatient because of pos op management Level of care: Telemetry Medical   Dispo: The patient is from:SNF            Anticipated disposition:SNF once available. Patient is medically stable. Mobility Assessment (last 72 hours)     Mobility Assessment     Row Name 09/12/21 1209 09/11/21 2124 09/11/21 0845 09/10/21 2300 09/10/21 1600   Does patient have an order for bedrest or is patient medically unstable -- Yes- Bedfast (Level 1) - Complete Yes- Bedfast (Level 1) - Complete No - Continue assessment --   What is the highest level of mobility based on the progressive mobility assessment? Level 3 (Stands with assist) -  Balance while standing  and cannot march in place Level 1 (Bedfast) - Unable to balance while  sitting on edge of bed Level 1 (Bedfast) - Unable to balance while sitting on edge of bed Level 2 (Chairfast) - Balance while sitting on edge of bed and cannot stand Level 2 (Chairfast) - Balance while sitting on edge of bed and cannot stand   Is the above level different from baseline mobility prior to current illness? -- Yes - Recommend PT order Yes - Recommend PT order Yes - Recommend PT order --    Row Name 09/10/21 1400 09/09/21 22:47:42         Does patient have an order for bedrest or is patient medically unstable -- No - Continue assessment      What is the highest level of mobility based on the progressive mobility assessment? Level 3 (Stands with assist) - Balance while standing  and cannot march in place Level 1 (Bedfast) - Unable to balance while sitting on edge of bed      Is the above level different from baseline mobility prior to current illness? -- Yes - Recommend PT order              Objective: Vitals last 24 hrs: Vitals:   09/11/21 1700 09/11/21 1900 09/12/21 0420 09/12/21 0814  BP: (!) 173/95 (!) 150/110 (!) 151/66 (!) 166/51  Pulse: 64 79 64 66  Resp: 19 15  19   Temp: 98.6 F (37 C) 97.9 F (36.6 C) 97.9 F (36.6 C) 98.6 F (37 C)  TempSrc: Oral Oral Oral Oral  SpO2: 94% 96% 91% 90%  Weight:      Height:       Weight change:   Physical Examination: General exam: Aaox1-2, older than stated age, weak appearing. HEENT:Oral mucosa moist, Ear/Nose WNL grossly, dentition normal. Respiratory system: bilaterally diminished, no use of accessory muscle Cardiovascular system: S1 & S2 +, No JVD,. Gastrointestinal system: Abdomen soft,NT,ND,BS+ Nervous System:Alert, awake, moving extremities and grossly nonfocal Extremities: Left hip surgical site with Aquacel dressing in place  Skin: No rashes,no icterus. MSK: Normal muscle bulk,tone, power    Medications reviewed:  Scheduled Meds:  ALPRAZolam  0.25 mg Oral BID   amLODipine  10 mg Oral Daily   calcium  carbonate  1,000 mg Oral Daily   Chlorhexidine Gluconate Cloth  6 each Topical Q0600   docusate sodium  100 mg Oral BID   enoxaparin (LOVENOX) injection  30 mg Subcutaneous Q24H   escitalopram  10 mg Oral Daily   feeding supplement  237 mL Oral BID BM   ferrous sulfate  325 mg Oral Q breakfast   folic acid  1 mg Oral QODAY   magnesium oxide  400 mg Oral BID   multivitamin with minerals  1 tablet Oral Daily   mupirocin ointment  1 Application Nasal BID   pantoprazole  40 mg Oral Daily   rosuvastatin  10 mg Oral QHS   tranexamic acid (CYKLOKAPRON) 2,000 mg in sodium chloride 0.9 % 50 mL Topical Application  8,416 mg Topical Once   Continuous Infusions:  methocarbamol (ROBAXIN) IV        Diet Order             DIET DYS 3 Room service appropriate? Yes; Fluid consistency: Thin  Diet effective now                    Nutrition Problem: Increased nutrient needs Etiology: hip fracture, post-op  healing Signs/Symptoms: estimated needs Interventions: Ensure Enlive (each supplement provides 350kcal and 20 grams of protein), MVI   Intake/Output Summary (Last 24 hours) at 09/12/2021 1243 Last data filed at 09/12/2021 1100 Gross per 24 hour  Intake 240 ml  Output --  Net 240 ml   Net IO Since Admission: 1,095.41 mL [09/12/21 1243]  Wt Readings from Last 3 Encounters:  09/09/21 68.8 kg  02/10/21 68.5 kg  02/07/21 67.9 kg     Unresulted Labs (From admission, onward)     Start     Ordered   09/16/21 0500  Creatinine, serum  (enoxaparin (LOVENOX)  CrCl >/= 30 mL/min  )  Weekly,   R     Comments: while on enoxaparin therapy.    09/09/21 1712   09/12/21 0500  CBC  Daily at 5am,   R      09/11/21 0807   09/11/21 3419  Basic metabolic panel  Daily at 5am,   R      09/10/21 0835           Data Reviewed: I have personally reviewed following labs and imaging studies CBC: Recent Labs  Lab 09/08/21 1106 09/09/21 0435 09/09/21 1829 09/10/21 0141 09/11/21 0228  09/12/21 0142  WBC 14.0* 11.1* 14.4* 12.8* 16.0* 13.5*  NEUTROABS 11.4*  --   --   --   --   --   HGB 10.5* 9.5* 9.5* 7.8* 8.7* 8.3*  HCT 32.6* 29.2* 30.4* 24.4* 25.8* 25.8*  MCV 98.5 99.7 99.7 98.4 92.8 96.6  PLT 240 232 268 241 211 379   Basic Metabolic Panel: Recent Labs  Lab 09/08/21 1106 09/09/21 0435 09/09/21 1829 09/10/21 0141 09/11/21 0228 09/12/21 0142  NA 139 144  --  138 137 137  K 3.9 3.6  --  4.3 4.1 4.4  CL 106 111  --  105 104 107  CO2 23 21*  --  21* 21* 21*  GLUCOSE 121* 80  --  142* 117* 116*  BUN 53* 49*  --  55* 57* 57*  CREATININE 2.44* 2.13* 2.38* 2.46* 2.65* 2.56*  CALCIUM 9.0 8.4*  --  8.1* 8.1* 8.1*   GFR: Estimated Creatinine Clearance: 13.9 mL/min (A) (by C-G formula based on SCr of 2.56 mg/dL (H)). Liver Function Tests: Recent Labs  Lab 09/09/21 0435  AST 23  ALT 16  ALKPHOS 42  BILITOT 0.9  PROT 6.2*  ALBUMIN 2.6*   No results for input(s): "LIPASE", "AMYLASE" in the last 168 hours. No results for input(s): "AMMONIA" in the last 168 hours. Coagulation Profile: No results for input(s): "INR", "PROTIME" in the last 168 hours. BNP (last 3 results) No results for input(s): "PROBNP" in the last 8760 hours. HbA1C: No results for input(s): "HGBA1C" in the last 72 hours. CBG: Recent Labs  Lab 09/11/21 1201  GLUCAP 104*   Lipid Profile: No results for input(s): "CHOL", "HDL", "LDLCALC", "TRIG", "CHOLHDL", "LDLDIRECT" in the last 72 hours. Thyroid Function Tests: Recent Labs    09/09/21 1829  TSH 1.202   Sepsis Labs: No results for input(s): "PROCALCITON", "LATICACIDVEN" in the last 168 hours.  Recent Results (from the past 240 hour(s))  Surgical pcr screen     Status: Abnormal   Collection Time: 09/09/21  9:59 AM   Specimen: Nasal Mucosa; Nasal Swab  Result Value Ref Range Status   MRSA, PCR NEGATIVE NEGATIVE Final   Staphylococcus aureus POSITIVE (A) NEGATIVE Final    Comment: (NOTE) The Xpert SA Assay (FDA approved for  NASAL specimens in patients 72 years of age and older), is one component of a comprehensive surveillance program. It is not intended to diagnose infection nor to guide or monitor treatment. Performed at [facility], [place] [address] [place],  [address]     Antimicrobials: Anti-infectives (From admission, onward)    Start     Dose/Rate Route Frequency Ordered Stop   09/09/21 1800  ceFAZolin (ANCEF) IVPB 2g/100 mL premix        2 g 200 mL/hr over 30 Minutes Intravenous Every 6 hours 09/09/21 1712 09/10/21 2242   09/09/21 1223  vancomycin (VANCOCIN) powder  Status:  Discontinued          As needed 09/09/21 1223 09/09/21 1324   09/09/21 1045  ceFAZolin (ANCEF) IVPB 2g/100 mL premix        2 g 200 mL/hr over 30 Minutes Intravenous On call to O.R. 09/09/21 1044 09/09/21 1151      Culture/Microbiology No results found for: "SDES", "SPECREQUEST", "CULT", "REPTSTATUS"  Other culture-see note  Radiology Studies: No results found.   LOS: 4 days   Antonieta Pert, MD [facility]  09/12/2021, 12:43 PM

## 2021-09-13 ENCOUNTER — Other Ambulatory Visit: Payer: Self-pay

## 2021-09-13 DIAGNOSIS — S72002A Fracture of unspecified part of neck of left femur, initial encounter for closed fracture: Secondary | ICD-10-CM | POA: Diagnosis not present

## 2021-09-13 LAB — BASIC METABOLIC PANEL
Anion gap: 8 (ref 5–15)
BUN: 56 mg/dL — ABNORMAL HIGH (ref 8–23)
CO2: 22 mmol/L (ref 22–32)
Calcium: 8.2 mg/dL — ABNORMAL LOW (ref 8.9–10.3)
Chloride: 108 mmol/L (ref 98–111)
Creatinine, Ser: 2.47 mg/dL — ABNORMAL HIGH (ref 0.44–1.00)
GFR, Estimated: 18 mL/min — ABNORMAL LOW (ref >60)
Glucose, Bld: 104 mg/dL — ABNORMAL HIGH (ref 70–99)
Potassium: 4.8 mmol/L (ref 3.5–5.1)
Sodium: 138 mmol/L (ref 135–145)

## 2021-09-13 LAB — CBC
HCT: 28.5 % — ABNORMAL LOW (ref 36.0–46.0)
Hemoglobin: 9.2 g/dL — ABNORMAL LOW (ref 12.0–15.0)
MCH: 31 pg (ref 26.0–34.0)
MCHC: 32.3 g/dL (ref 30.0–36.0)
MCV: 96 fL (ref 80.0–100.0)
Platelets: 220 10*3/uL (ref 150–400)
RBC: 2.97 MIL/uL — ABNORMAL LOW (ref 3.87–5.11)
RDW: 16.5 % — ABNORMAL HIGH (ref 11.5–15.5)
WBC: 13.1 10*3/uL — ABNORMAL HIGH (ref 4.0–10.5)
nRBC: 0 % (ref 0.0–0.2)

## 2021-09-13 MED ORDER — ALPRAZOLAM 0.25 MG PO TABS: 0.2500 mg | ORAL_TABLET | Freq: Two times a day (BID) | ORAL | 0 refills | Status: DC

## 2021-09-13 MED ORDER — ENSURE ENLIVE PO LIQD: 237.0000 mL | Freq: Two times a day (BID) | ORAL | 12 refills | Status: AC

## 2021-09-13 MED ORDER — POLYETHYLENE GLYCOL 3350 17 G PO PACK
17.0000 g | PACK | Freq: Every day | ORAL | 0 refills | Status: AC | PRN
Start: 2021-09-13 — End: ?

## 2021-09-13 NOTE — Progress Notes (Signed)
Occupational Therapy Treatment Patient Details Name: Tanya Koch MRN: [id number] DOB: 06/29/1928 Today's Date: 09/13/2021   History of present illness Pt is a 88 y.o. female with PMH of bradycardia, history of MI, history of unspecified CHF with recovered EF, COVID-19, emphysema, history of hematemesis, hypertension, dementia, history of closed right hip fracture in December 2022 who had an unwitnessed fall and was found on the floor at her SNF facility this morning with inability to stand up and bear weight on her LLE. Pt s/p left hip hemiarthroplasty performed on 07/14.   OT comments  Pt making slow progression towards goals, able to stand x3 in [place] with mod-max A for pad change. Pt mod A for bed mobility, and min guard for seated ADLs at EOB. Pt reporting some dizziness sitting EOB, BP WNL. Pt presenting with impairments listed below, will follow acutely. Continue to recommend SNF at d/c.   Recommendations for follow up therapy are one component of a multi-disciplinary discharge planning process, led by the attending physician.  Recommendations may be updated based on patient status, additional functional criteria and insurance authorization.    Follow Up Recommendations  Skilled nursing-short term rehab (<3 hours/day)    Assistance Recommended at Discharge Frequent or constant Supervision/Assistance  Patient can return home with the following  A lot of help with bathing/dressing/bathroom;Two people to help with walking and/or transfers;Assistance with cooking/housework;Assistance with feeding;Direct supervision/assist for medications management;Direct supervision/assist for financial management;Assist for transportation;Help with stairs or ramp for entrance   Equipment Recommendations  None recommended by OT;Other (comment) (defer to next venue of care)    Recommendations for Other Services PT consult    Precautions / Restrictions Precautions Precautions: Fall Precaution Comments:  no hip precautions Restrictions Weight Bearing Restrictions: Yes LLE Weight Bearing: Weight bearing as tolerated       Mobility Bed Mobility Overal bed mobility: Needs Assistance Bed Mobility: Supine to Sit, Sit to Supine     Supine to sit: Mod assist Sit to supine: Mod assist   General bed mobility comments: minimal initiation, assist for BLE and trunk    Transfers Overall transfer level: Needs assistance Equipment used: Ambulation equipment used Transfers: Sit to/from Stand, Bed to chair/wheelchair/BSC Sit to Stand: Mod assist, Max assist           General transfer comment: max A for initial trial, able to perform sit to stand x3 in [place] with mod A Transfer via Lift Equipment: Stedy   Balance Overall balance assessment: Needs assistance Sitting-balance support: Bilateral upper extremity supported, Feet supported Sitting balance-Leahy Scale: Fair     Standing balance support: Bilateral upper extremity supported Standing balance-Leahy Scale: Zero                             ADL either performed or assessed with clinical judgement   ADL Overall ADL's : Needs assistance/impaired     Grooming: Wash/dry hands;Wash/dry face;Min guard;Sitting Grooming Details (indicate cue type and reason): completes sitting EOB                             Functional mobility during ADLs: Maximal assistance      Extremity/Trunk Assessment Upper Extremity Assessment Upper Extremity Assessment: Generalized weakness   Lower Extremity Assessment Lower Extremity Assessment: Defer to PT evaluation        Vision   Vision Assessment?: No apparent visual deficits   Perception Perception Perception: Not  tested   Praxis Praxis Praxis: Not tested    Cognition Arousal/Alertness: Awake/alert Behavior During Therapy: Flat affect Overall Cognitive Status: History of cognitive impairments - at baseline                                  General Comments: hx of dementia        Exercises      Shoulder Instructions       General Comments VSS on RA, pt reporting dizziness upon sitting EOB, BP WNL    Pertinent Vitals/ Pain       Pain Assessment Pain Assessment: Faces Pain Score: 3  Faces Pain Scale: Hurts little more Pain Location: L hip Pain Descriptors / Indicators: Discomfort, Guarding Pain Intervention(s): Limited activity within patient's tolerance, Monitored during session, Repositioned  Home Living                                          Prior Functioning/Environment              Frequency  Min 2X/week        Progress Toward Goals  OT Goals(current goals can now be found in the care plan section)  Progress towards OT goals: Progressing toward goals  Acute Rehab OT Goals Patient Stated Goal: none stated OT Goal Formulation: Patient unable to participate in goal setting Time For Goal Achievement: 09/24/21 Potential to Achieve Goals: Fair ADL Goals Pt Will Perform Grooming: with supervision;sitting Pt Will Perform Upper Body Dressing: with caregiver independent in assisting;sitting;standing Pt Will Perform Lower Body Dressing: with caregiver independent in assisting;sit to/from stand;sitting/lateral leans;bed level Pt Will Transfer to Toilet: squat pivot transfer;bedside commode;with mod assist;with +2 assist  Plan Discharge plan remains appropriate;Frequency remains appropriate    Co-evaluation                 AM-PAC OT "6 Clicks" Daily Activity     Outcome Measure   Help from another person eating meals?: A Little Help from another person taking care of personal grooming?: A Little Help from another person toileting, which includes using toliet, bedpan, or urinal?: Total Help from another person bathing (including washing, rinsing, drying)?: A Lot Help from another person to put on and taking off regular upper body clothing?: A Lot Help from another  person to put on and taking off regular lower body clothing?: Total 6 Click Score: 12    End of Session Equipment Utilized During Treatment: Gait belt;Other (comment) (stedy)  OT Visit Diagnosis: Unsteadiness on feet (R26.81);Other abnormalities of gait and mobility (R26.89);Muscle weakness (generalized) (M62.81)   Activity Tolerance Patient tolerated treatment well   Patient Left in bed;with call bell/phone within reach;with bed alarm set;with nursing/sitter in room   Nurse Communication Mobility status        Time: 5643-3295 OT Time Calculation (min): 22 min  Charges: OT General Charges $OT Visit: 1 Visit OT Treatments $Self Care/Home Management : 8-22 mins  Lynnda Child, OTD, OTR/L Acute Rehab [phone]) [phone] - [place] 09/13/2021, 3:45 PM

## 2021-09-13 NOTE — TOC Progression Note (Addendum)
Transition of Care [facility]) - Progression Note    Patient Details  Name: Tanya Koch MRN: [id number] Date of Birth: [birth year]  Transition [facility] CM/SW Contact  Joanne Chars, LCSW Phone Number: 09/13/2021, 8:48 AM  Clinical Narrative:   CSW received message from [place], [place], that they would like to choose [facility].  CSW confirmed bed available today with [facility].  Auth request submitted in [place].   1145: Auth still pending in [employer].    Expected Discharge Plan: Skilled Nursing Facility Barriers to Discharge: SNF Pending bed offer  Expected Discharge Plan and Services Expected Discharge Plan: [place] In-house Referral: Clinical Social Work   Post Acute Care Choice: [place] Living arrangements for the past 2 months: [place] [facility] Educational psychologist)                                       Social Determinants of Health (SDOH) Interventions    Readmission Risk Interventions     No data to display

## 2021-09-13 NOTE — Plan of Care (Addendum)
Careplan not progressing, Pt is memory impaired

## 2021-09-13 NOTE — Discharge Summary (Signed)
Physician Discharge Summary  Tanya Koch OQH:[id number] DOB: [birth year] DOA: 09/08/2021  PCP: Earmon Phoenix, NP  Admit date: 09/08/2021 Discharge date: 09/13/2021 Recommendations for Outpatient Follow-up:  Follow up with PCP in 1 weeks-call for appointment Please obtain BMP/CBC in one week  Discharge Dispo: SNF Discharge Condition: Stable Code Status:   Code Status: DNR Diet recommendation:  Diet Order             Diet - low sodium heart healthy           DIET DYS 3 Room service appropriate? Yes; Fluid consistency: Thin  Diet effective now                    Brief/Interim Summary: bradycardia, history of MI, unspecified heart failure, with recovered ejection fraction, COVID-19, emphysema, hypertension,  history of hematemesis, history of right closed right hip fracture December 2022 who had an unwitnessed fall and was found on the floor at her skilled nursing facility, in ED was found to have left hip fracture,CT cervical spine and CT head with no acute abnormality but show extensive calcific atherosclerosis of both carotid bifurcation-underwent carotid Doppler that shows 1 to 39% stenosis.,  Bilateral vertebral arteries demonstrated antegrade flow.  Patient transferred to [facility] underwent left hip hemiarthroplasty by Dr. Erlinda Hong 7/14.  Underwent 1 unit PRBC transfusion 7/15.Patient remains medically stable.  CKD with a stable renal function leukocytosis downtrending hemoglobin is stable >8 g. Stable for SNF.  Pending bed placement see still for discharge.  Discharge Diagnoses:  Principal Problem:   Closed left hip fracture, initial encounter [facility]) Active Problems:   Dementia without behavioral disturbance (HCC)   GERD (gastroesophageal reflux disease)   Chronic kidney disease (CKD), stage IV (severe) (HCC)   Hypertension   Emphysema lung (HCC)   Hyperlipidemia   Hypothermia   Normocytic anemia   Leukocytosis  Closed left hip fracture, initial encounter s/p  left hip  hemiarthroplasty by Dr. Erlinda Hong 7/14.  Surgically stable orthopedic following, WBAT LLE, DVT prophylaxis with Lovenox/SCD and outpatient follow-up with Dr. Erlinda Hong in 2 weeks.  Continue PT OT pain control and DVT prophylaxis with Lovenox x2 weeks and opiates as prescribed by orthopedics.    Acute blood loss anemia in the setting of anemia chronic disease from [facility]:[id number] 1 unit PRBC transfusion 7/15 - hb up in 8.7> 8.3? 8.2. monitor.  Recent Labs  Lab 09/09/21 1829 09/10/21 0141 09/11/21 0228 09/12/21 0142 09/13/21 0236  HGB 9.5* 7.8* 8.7* 8.3* 9.2*  HCT 30.4* 24.4* 25.8* 25.8* 28.5*   Leukocytosis likely reactive in the setting of hip fracture, downtrending.  Check CBC in 1 week Recent Labs  Lab 09/09/21 1829 09/10/21 0141 09/11/21 0228 09/12/21 0142 09/13/21 0236  WBC 14.4* 12.8* 16.0* 13.5* 13.1*    Dementia without behavioral disturbance: Pleasantly confused, continue supportive care fall precaution delirium precaution continue Lexapro/Xanax. GERD-on PPI CKD stage IV creatinine around the baseline.  Resume Lasix Essential hypertension stable on amlodipine.  Hypothermia on admission resolved, chest x-ray UA unremarkable continue warm blanket, TSH euthyroid Emphysema lung-encourage incentive spirometry Bilateral carotid artery disease- ruled on. Seen in CT imaging but Doppler neck unremarkable  Consults: ORTHO Subjective: Resting comfortably pleasantly confused  Discharge Exam: Vitals:   09/13/21 0546 09/13/21 0810  BP: (!) 151/65 (!) 156/52  Pulse: 69 61  Resp: 15 17  Temp:  99 F (37.2 C)  SpO2: 93% 93%   General: Pt is alert, awake, not in acute distress Cardiovascular: RRR, S1/S2 +, no rubs,  no gallops Respiratory: CTA bilaterally, no wheezing, no rhonchi Abdominal: Soft, NT, ND, bowel sounds + Extremities: no edema, no cyanosis  Discharge Instructions  Discharge Instructions     Diet - low sodium heart healthy   Complete by: As directed    Discharge  instructions   Complete by: As directed    Check CBC and BMP in 1 week Follow-up with orthopedic surgery 2 weeks from day of discharge  Please call call MD or return to ER for similar or worsening recurring problem that brought you to hospital or if any fever,nausea/vomiting,abdominal pain, uncontrolled pain, chest pain,  shortness of breath or any other alarming symptoms.  Please follow-up your doctor as instructed in a week time and call the office for appointment.  Please avoid alcohol, smoking, or any other illicit substance and maintain healthy habits including taking your regular medications as prescribed.  You were cared for by a hospitalist during your hospital stay. If you have any questions about your discharge medications or the care you received while you were in the hospital after you are discharged, you can call the unit and ask to speak with the hospitalist on call if the hospitalist that took care of you is not available.  Once you are discharged, your primary care physician will handle any further medical issues. Please note that NO REFILLS for any discharge medications will be authorized once you are discharged, as it is imperative that you return to your primary care physician (or establish a relationship with a primary care physician if you do not have one) for your aftercare needs so that they can reassess your need for medications and monitor your lab values   Discharge wound care:   Complete by: As directed    Reinforce dressing as needed and follow-up instruction provided   Increase activity slowly   Complete by: As directed    Weight bearing as tolerated   Complete by: As directed       Allergies as of 09/13/2021   No Known Allergies      Medication List     TAKE these medications    acetaminophen 325 MG tablet Commonly known as: TYLENOL Take 650 mg by mouth every 6 (six) hours as needed for moderate pain.   ALPRAZolam 0.25 MG tablet Commonly known as:  XANAX Take 1 tablet (0.25 mg total) by mouth 2 (two) times daily for 4 doses. May also have 1 tablet daily as needed for anxiety   amLODipine 10 MG tablet Commonly known as: NORVASC Take 10 mg by mouth daily.   calcium carbonate 500 MG chewable tablet Commonly known as: TUMS - dosed in mg elemental calcium Chew 1,000 mg by mouth daily.   docusate sodium 100 MG capsule Commonly known as: COLACE Take 100 mg by mouth at bedtime.   enoxaparin 40 MG/0.4ML injection Commonly known as: LOVENOX Inject 0.4 mLs (40 mg total) into the skin daily for 14 days.   Ensure Take 237 mLs by mouth 2 (two) times daily between meals. What changed: Another medication with the same name was added. Make sure you understand how and when to take each.   feeding supplement Liqd Take 237 mLs by mouth 2 (two) times daily between meals. What changed: You were already taking a medication with the same name, and this prescription was added. Make sure you understand how and when to take each.   escitalopram 10 MG tablet Commonly known as: LEXAPRO Take 10 mg by mouth daily.  ferrous sulfate 325 (65 FE) MG tablet Take 325 mg by mouth daily with breakfast.   folic acid 1 MG tablet Commonly known as: FOLVITE Take 1 mg by mouth every other day.   furosemide 20 MG tablet Commonly known as: LASIX Take 20 mg by mouth daily.   HYDROcodone-acetaminophen 5-325 MG tablet Commonly known as: Norco Take 1 tablet by mouth every 6 (six) hours as needed.   magnesium oxide 400 (240 Mg) MG tablet Commonly known as: MAG-OX Take 400 mg by mouth 2 (two) times daily.   ondansetron 4 MG tablet Commonly known as: ZOFRAN Take 1 tablet (4 mg total) by mouth every 8 (eight) hours as needed for nausea or vomiting.   pantoprazole 40 MG tablet Commonly known as: PROTONIX Take 40 mg by mouth daily.   polyethylene glycol 17 g packet Commonly known as: MIRALAX / GLYCOLAX Take 17 g by mouth daily as needed for mild  constipation.   rosuvastatin 10 MG tablet Commonly known as: CRESTOR Take 10 mg by mouth at bedtime.   vitamin B-12 1000 MCG tablet Commonly known as: CYANOCOBALAMIN Take 2,000 mcg by mouth daily.   Zinc 30 MG Tabs Take 30 mg by mouth daily.               Discharge Care Instructions  (From admission, onward)           Start     Ordered   09/13/21 0000  Discharge wound care:       Comments: Reinforce dressing as needed and follow-up instruction provided   09/13/21 1055   09/09/21 0000  Weight bearing as tolerated        09/09/21 1254            Follow-up Information     Leandrew Koyanagi, MD Follow up in 2 week(s).   Specialty: Orthopedic Surgery Why: For suture removal, For wound re-check Contact information: [place] [id number] [phone]         Earmon Phoenix, NP Follow up in 1 week(s).   Specialty: Gerontology Contact information: [place] [address] [phone]         Early Osmond, MD .   Specialty: Cardiology Contact information: [place] [address] [phone]                No Known Allergies  The results of significant diagnostics from this hospitalization (including imaging, microbiology, ancillary and laboratory) are listed below for reference.    Microbiology: Recent Results (from the past 240 hour(s))  Surgical pcr screen     Status: Abnormal   Collection Time: 09/09/21  9:59 AM   Specimen: Nasal Mucosa; Nasal Swab  Result Value Ref Range Status   MRSA, PCR NEGATIVE NEGATIVE Final   Staphylococcus aureus POSITIVE (A) NEGATIVE Final    Comment: (NOTE) The Xpert SA Assay (FDA approved for NASAL specimens in patients 46 years of age and older), is one component of a comprehensive surveillance program. It is not intended to diagnose infection nor to guide or monitor treatment. Performed at [facility], [place] [address]  [place], [place] [address]     Procedures/Studies: Pelvis Portable  Result Date: 09/09/2021 CLINICAL DATA:  A 87 year old presents for follow-up following hip arthroplasty. EXAM: PORTABLE PELVIS 1-2 VIEWS COMPARISON:  CT of the pelvis of July of 2013. FINDINGS: Immediate postoperative appearance of the LEFT hip following hip arthroplasty with no unexpected radiographic abnormality.  Soft tissue gas over the LEFT hip. Osteopenia without signs of complication on AP views. Incidental note is made again of fracture of the RIGHT greater trochanter which is a chronic finding. IMPRESSION: 1. Immediate postoperative appearance following LEFT hip arthroplasty without signs of complication on AP views. 2. Chronic RIGHT greater trochanter fracture. Electronically Signed   By: Zetta Bills M.D.   On: 09/09/2021 13:45   DG HIP UNILAT WITH PELVIS 1V LEFT  Result Date: 09/09/2021 CLINICAL DATA:  Fracture left hip, fluoroscopic assistance for left hip arthroplasty EXAM: DG HIP (WITH OR WITHOUT PELVIS) 1V*L* COMPARISON:  09/08/2021 FINDINGS: Fluoroscopic images show left hip arthroplasty. Fluoroscopic time was 9 seconds. Radiation dose 0.34 mGy. IMPRESSION: Fluoroscopic assistance was provided for left hip arthroplasty. Electronically Signed   By: Elmer Picker M.D.   On: 09/09/2021 12:55   DG C-Arm 1-60 Min-No Report  Result Date: 09/09/2021 Fluoroscopy was utilized by the requesting physician.  No radiographic interpretation.   VAS US CAROTID  Result Date: 09/08/2021 Carotid Arterial Duplex Study Patient Name:  KIMIE PIDCOCK  Date of Exam:   09/08/2021 Medical Rec #: [id number]       Accession #:    [id number] Date of Birth: 04/20/1928       Patient Gender: F Patient Age:   43 years Exam Location:  [facility] Procedure:      VAS US CAROTID Referring Phys: Shanon Brow ORTIZ --------------------------------------------------------------------------------  Indications:       Atherosclerosis by CT. Risk Factors:       Hypertension, hyperlipidemia. Other Factors:     Extensive calcific atherosclerosis at the bilateral carotid                    bifurcations by CT, unable to have CTA secondary to CKD.                    Atrial fibrillation. Status post fall resulting in hip                    fracture. Limitations        Today's exam was limited due to tachycardia and arrythmia. Comparison Study:  No prior study on file Performing Technologist: Sharion Dove RVS  Examination Guidelines: A complete evaluation includes B-mode imaging, spectral Doppler, color Doppler, and power Doppler as needed of all accessible portions of each vessel. Bilateral testing is considered an integral part of a complete examination. Limited examinations for reoccurring indications may be performed as noted.  Right Carotid Findings: +----------+--------+--------+--------+------------------+---------+           PSV cm/sEDV cm/sStenosisPlaque DescriptionComments  +----------+--------+--------+--------+------------------+---------+ CCA Prox  47      8               heterogenous                +----------+--------+--------+--------+------------------+---------+ CCA Distal36      8               heterogenous                +----------+--------+--------+--------+------------------+---------+ ICA Prox  111     39              calcific          Shadowing +----------+--------+--------+--------+------------------+---------+ ICA Mid   87      28                                          +----------+--------+--------+--------+------------------+---------+  ICA Distal85      20                                          +----------+--------+--------+--------+------------------+---------+ ECA       67      5                                           +----------+--------+--------+--------+------------------+---------+ +----------+--------+-------+--------+-------------------+           PSV cm/sEDV cmsDescribeArm  Pressure (mmHG) +----------+--------+-------+--------+-------------------+ [id number]                                        +----------+--------+-------+--------+-------------------+ +---------+--------+--+--------+-+ VertebralPSV cm/s26EDV cm/s4 +---------+--------+--+--------+-+  Left Carotid Findings: +----------+--------+--------+--------+------------------+---------+           PSV cm/sEDV cm/sStenosisPlaque DescriptionComments  +----------+--------+--------+--------+------------------+---------+ CCA Prox  65      7               heterogenous                +----------+--------+--------+--------+------------------+---------+ CCA Distal61      13              heterogenous                +----------+--------+--------+--------+------------------+---------+ ICA Prox  77      20              calcific          Shadowing +----------+--------+--------+--------+------------------+---------+ ICA Mid   109     26                                          +----------+--------+--------+--------+------------------+---------+ ICA Distal55      13                                          +----------+--------+--------+--------+------------------+---------+ ECA       36      2                                           +----------+--------+--------+--------+------------------+---------+ +----------+--------+--------+--------+-------------------+           PSV cm/sEDV cm/sDescribeArm Pressure (mmHG) +----------+--------+--------+--------+-------------------+ Subclavian80                                          +----------+--------+--------+--------+-------------------+ +---------+--------+--+--------+--+ VertebralPSV cm/s54EDV cm/s12 +---------+--------+--+--------+--+   Summary: Right Carotid: Velocities in the right ICA are consistent with a 1-39% stenosis. Left Carotid: Velocities in the left ICA are consistent with a 1-39% stenosis. Vertebrals:   Bilateral vertebral arteries demonstrate antegrade flow. Subclavians: Normal flow hemodynamics were seen in bilateral subclavian              arteries. *See table(s) above for measurements and observations.  Electronically  signed by Orlie Pollen on 09/08/2021 at 4:48:43 PM.    Final    DG Chest 2 View  Result Date: 09/08/2021 CLINICAL DATA:  Fall, dementia, left hip fracture EXAM: CHEST - 2 VIEW COMPARISON:  02/10/2021 FINDINGS: Similar cardiomegaly and central vascular congestion. Negative for CHF or definite pneumonia. Minor basilar atelectasis. No effusion or pneumothorax. Trachea midline. Aorta atherosclerotic. No acute osseous finding. Degenerative changes throughout the spine. IMPRESSION: Cardiomegaly without acute process Aortic Atherosclerosis (ICD10-I70.0). Electronically Signed   By: Jerilynn Mages.  Shick M.D.   On: 09/08/2021 10:23   DG Knee Complete 4 Views Right  Result Date: 09/08/2021 CLINICAL DATA:  Fall, left hip fracture EXAM: RIGHT KNEE - COMPLETE 4+ VIEW COMPARISON:  09/08/2021 FINDINGS: Bones are osteopenic. Diffuse right knee degenerative changes and chondrocalcinosis. No acute osseous finding, fracture, malalignment or effusion. Peripheral atherosclerosis. No focal soft tissue abnormality. IMPRESSION: Osteopenia and degenerative changes. No acute finding by plain radiography Electronically Signed   By: Jerilynn Mages.  Shick M.D.   On: 09/08/2021 10:21   DG Hip Unilat W or Wo Pelvis 2-3 Views Left  Result Date: 09/08/2021 CLINICAL DATA:  Unwitnessed fall, pain EXAM: DG HIP (WITH OR WITHOUT PELVIS) 2-3V LEFT COMPARISON:  09/08/2021 FINDINGS: There is an acute displaced and mildly impacted left hip subcapital femoral neck fracture noted. Bones are osteopenic. Pelvis and right hip appear intact. Peripheral atherosclerosis noted. IMPRESSION: Acute displaced left hip subcapital femoral neck fracture. Electronically Signed   By: Jerilynn Mages.  Shick M.D.   On: 09/08/2021 10:20   DG Knee Complete 4 Views Left  Result  Date: 09/08/2021 CLINICAL DATA:  Unwitnessed fall, dementia EXAM: LEFT KNEE - COMPLETE 4+ VIEW COMPARISON:  09/08/2021 FINDINGS: Bones are osteopenic. Degenerative changes throughout the knee and chondrocalcinosis. No acute osseous finding, fracture, malalignment, or effusion. Patella located. No soft tissue abnormality. Peripheral vascular calcifications present. IMPRESSION: Osteopenia and degenerative changes as above. No acute osseous finding. Peripheral atherosclerosis Electronically Signed   By: Jerilynn Mages.  Shick M.D.   On: 09/08/2021 10:17   CT Head Wo Contrast  Result Date: 09/08/2021 CLINICAL DATA:  Neck trauma (Age >= 65y); Head trauma, moderate-severe EXAM: CT HEAD WITHOUT CONTRAST CT CERVICAL SPINE WITHOUT CONTRAST TECHNIQUE: Multidetector CT imaging of the head and cervical spine was performed following the standard protocol without intravenous contrast. Multiplanar CT image reconstructions of the cervical spine were also generated. RADIATION DOSE REDUCTION: This exam was performed according to the departmental dose-optimization program which includes automated exposure control, adjustment of the mA and/or kV according to patient size and/or use of iterative reconstruction technique. COMPARISON:  None Available. FINDINGS: CT HEAD FINDINGS Brain: No evidence of acute infarction, hemorrhage, hydrocephalus, extra-axial collection or mass lesion/mass effect. Cerebral atrophy. Vascular: Calcific intracranial atherosclerosis. Skull: No acute fracture. Sinuses/Orbits: Clear sinuses.  No acute orbital findings. Other: No mastoid effusions. CT CERVICAL SPINE FINDINGS Alignment: Unchanged alignment. Similar slight anterolisthesis of C7 on T1. Straightening. Skull base and vertebrae: Vertebral body heights are maintained. No evidence of acute fracture. Soft tissues and spinal canal: No prevertebral fluid or swelling. No visible canal hematoma. Disc levels: Multilevel degenerative change including degenerative disc  disease, facet and uncovertebral hypertrophy with varying degrees of neural foraminal stenosis. Upper chest: Visualized lung apices are clear. Other: Extensive calcific atherosclerosis of the right greater than left carotid bifurcations. Subcentimeter thyroid nodules. Further imaging is not required (ref: J Am Coll Radiol. 2015 Feb;12(2): 143-50). IMPRESSION: 1. No evidence of acute intracranial abnormality. 2. No evidence of acute fracture or traumatic malalignment  in the cervical spine. 3. Extensive calcific atherosclerosis of the right greater than left carotid bifurcations. Potentially hemodynamically significant stenosis on the right. A CT of the neck could further characterize if clinically warranted. Electronically Signed   By: Margaretha Sheffield M.D.   On: 09/08/2021 09:59   CT Cervical Spine Wo Contrast  Result Date: 09/08/2021 CLINICAL DATA:  Neck trauma (Age >= 65y); Head trauma, moderate-severe EXAM: CT HEAD WITHOUT CONTRAST CT CERVICAL SPINE WITHOUT CONTRAST TECHNIQUE: Multidetector CT imaging of the head and cervical spine was performed following the standard protocol without intravenous contrast. Multiplanar CT image reconstructions of the cervical spine were also generated. RADIATION DOSE REDUCTION: This exam was performed according to the departmental dose-optimization program which includes automated exposure control, adjustment of the mA and/or kV according to patient size and/or use of iterative reconstruction technique. COMPARISON:  None Available. FINDINGS: CT HEAD FINDINGS Brain: No evidence of acute infarction, hemorrhage, hydrocephalus, extra-axial collection or mass lesion/mass effect. Cerebral atrophy. Vascular: Calcific intracranial atherosclerosis. Skull: No acute fracture. Sinuses/Orbits: Clear sinuses.  No acute orbital findings. Other: No mastoid effusions. CT CERVICAL SPINE FINDINGS Alignment: Unchanged alignment. Similar slight anterolisthesis of C7 on T1. Straightening. Skull  base and vertebrae: Vertebral body heights are maintained. No evidence of acute fracture. Soft tissues and spinal canal: No prevertebral fluid or swelling. No visible canal hematoma. Disc levels: Multilevel degenerative change including degenerative disc disease, facet and uncovertebral hypertrophy with varying degrees of neural foraminal stenosis. Upper chest: Visualized lung apices are clear. Other: Extensive calcific atherosclerosis of the right greater than left carotid bifurcations. Subcentimeter thyroid nodules. Further imaging is not required (ref: J Am Coll Radiol. 2015 Feb;12(2): 143-50). IMPRESSION: 1. No evidence of acute intracranial abnormality. 2. No evidence of acute fracture or traumatic malalignment in the cervical spine. 3. Extensive calcific atherosclerosis of the right greater than left carotid bifurcations. Potentially hemodynamically significant stenosis on the right. A CT of the neck could further characterize if clinically warranted. Electronically Signed   By: Margaretha Sheffield M.D.   On: 09/08/2021 09:59   CT PELVIS WO CONTRAST  Result Date: 09/08/2021 CLINICAL DATA:  Hip trauma, fracture suspected, xray done EXAM: CT PELVIS WITHOUT CONTRAST TECHNIQUE: Multidetector CT imaging of the pelvis was performed following the standard protocol without intravenous contrast. RADIATION DOSE REDUCTION: This exam was performed according to the departmental dose-optimization program which includes automated exposure control, adjustment of the mA and/or kV according to patient size and/or use of iterative reconstruction technique. COMPARISON:  None Available. FINDINGS: Urinary Tract:  Distended bladder. Bowel:  Unremarkable visualized pelvic bowel loops. Vascular/Lymphatic: Aorto bi-iliac atherosclerosis. Reproductive:  No mass or other significant abnormality Musculoskeletal: Potentially acute or subacute impacted left subcapital femoral neck fracture. Approximately half shaft with superior  displacement of the distal fracture fragment. Osteopenia. IMPRESSION: 1. Potentially acute or subacute impacted left subcapital femoral neck fracture. Approximately half shaft with superior displacement of the distal fracture fragment. 2. Osteopenia. 3. Aortic atherosclerosis (ICD10-I70.0). Electronically Signed   By: Margaretha Sheffield M.D.   On: 09/08/2021 09:54   DG SWALLOW FUNC OP MEDICARE SPEECH PATH  Result Date: 08/16/2021 CLINICAL DATA:  Dysphagia, regurgitation of foods, feeding difficulties, cough EXAM: MODIFIED BARIUM SWALLOW TECHNIQUE: Different consistencies of barium were administered orally to the patient by the Speech Pathologist. Imaging of the pharynx was performed in the lateral projection and minimal AP views were also obtained. Hayley Boisseau, PA-C was present in the fluoroscopy room during this study, which was supervised and interpreted by Edison Nasuti  Chancy Milroy, MD. FLUOROSCOPY: Radiation Exposure Index (as provided by the fluoroscopic device): 37.45 mGy Kerm[place] COMPARISON:  None Available. FINDINGS: Vestibular Penetration: Only one occurrence during multiple trials of thin consistency barium. Aspiration:  None seen. Other: Tortuous esophagus with severe dysmotility. There is a pulsion diverticulum noted in the distal 1/3 of the esophagus. A 1/2 inch barium tablet is delayed in transit just proximal to the diverticulum. IMPRESSION: Severe esophageal dysmotility with delayed passage of a 13 mm barium tablet just above a posterior pulsion diverticulum in the distal esophagus. This is likely due to dysmotility but a stricture is possible. Recommend correlation with endoscopy. Note that an esophagram would not add any specificity in this case and is not recommended. No intratracheal aspiration. Please refer to the Speech Pathologists report for complete details and recommendations. Electronically Signed   By: Maurine Simmering M.D.   On: 08/16/2021 13:08    Labs: BNP (last 3 results) No results for input(s):  "BNP" in the last 8760 hours. Basic Metabolic Panel: Recent Labs  Lab 09/09/21 0435 09/09/21 1829 09/10/21 0141 09/11/21 0228 09/12/21 0142 09/13/21 0236  NA 144  --  138 137 137 138  K 3.6  --  4.3 4.1 4.4 4.8  CL 111  --  105 104 107 108  CO2 21*  --  21* 21* 21* 22  GLUCOSE 80  --  142* 117* 116* 104*  BUN 49*  --  55* 57* 57* 56*  CREATININE 2.13* 2.38* 2.46* 2.65* 2.56* 2.47*  CALCIUM 8.4*  --  8.1* 8.1* 8.1* 8.2*   Liver Function Tests: Recent Labs  Lab 09/09/21 0435  AST 23  ALT 16  ALKPHOS 42  BILITOT 0.9  PROT 6.2*  ALBUMIN 2.6*   No results for input(s): "LIPASE", "AMYLASE" in the last 168 hours. No results for input(s): "AMMONIA" in the last 168 hours. CBC: Recent Labs  Lab 09/08/21 1106 09/09/21 0435 09/09/21 1829 09/10/21 0141 09/11/21 0228 09/12/21 0142 09/13/21 0236  WBC 14.0*   < > 14.4* 12.8* 16.0* 13.5* 13.1*  NEUTROABS 11.4*  --   --   --   --   --   --   HGB 10.5*   < > 9.5* 7.8* 8.7* 8.3* 9.2*  HCT 32.6*   < > 30.4* 24.4* 25.8* 25.8* 28.5*  MCV 98.5   < > 99.7 98.4 92.8 96.6 96.0  PLT 240   < > 268 241 211 196 220   < > = values in this interval not displayed.   Cardiac Enzymes: No results for input(s): "CKTOTAL", "CKMB", "CKMBINDEX", "TROPONINI" in the last 168 hours. BNP: Invalid input(s): "POCBNP" CBG: Recent Labs  Lab 09/11/21 1201  GLUCAP 104*   D-Dimer No results for input(s): "DDIMER" in the last 72 hours. Hgb A1c No results for input(s): "HGBA1C" in the last 72 hours. Lipid Profile No results for input(s): "CHOL", "HDL", "LDLCALC", "TRIG", "CHOLHDL", "LDLDIRECT" in the last 72 hours. Thyroid function studies No results for input(s): "TSH", "T4TOTAL", "T3FREE", "THYROIDAB" in the last 72 hours.  Invalid input(s): "FREET3" Anemia work up No results for input(s): "VITAMINB12", "FOLATE", "FERRITIN", "TIBC", "IRON", "RETICCTPCT" in the last 72 hours. Urinalysis    Component Value Date/Time   COLORURINE YELLOW  09/08/2021 2132   APPEARANCEUR HAZY (A) 09/08/2021 2132   LABSPEC 1.012 09/08/2021 2132   PHURINE 6.0 09/08/2021 2132   GLUCOSEU NEGATIVE 09/08/2021 2132   HGBUR SMALL (A) 09/08/2021 2132   BILIRUBINUR NEGATIVE 09/08/2021 2132   [place] NEGATIVE 09/08/2021 2132  PROTEINUR 100 (A) 09/08/2021 2132   NITRITE NEGATIVE 09/08/2021 2132   LEUKOCYTESUR NEGATIVE 09/08/2021 2132   Sepsis Labs Recent Labs  Lab 09/10/21 0141 09/11/21 0228 09/12/21 0142 09/13/21 0236  WBC 12.8* 16.0* 13.5* 13.1*   Microbiology Recent Results (from the past 240 hour(s))  Surgical pcr screen     Status: Abnormal   Collection Time: 09/09/21  9:59 AM   Specimen: Nasal Mucosa; Nasal Swab  Result Value Ref Range Status   MRSA, PCR NEGATIVE NEGATIVE Final   Staphylococcus aureus POSITIVE (A) NEGATIVE Final    Comment: (NOTE) The Xpert SA Assay (FDA approved for NASAL specimens in patients 27 years of age and older), is one component of a comprehensive surveillance program. It is not intended to diagnose infection nor to guide or monitor treatment. Performed at [facility], [place] [address] [place], [place] [address]      Time coordinating discharge: 35 minutes  SIGNED: Antonieta Pert, MD  [facility] 09/13/2021, 10:55 AM  If 7PM-7AM, please contact night-coverage [email or web address]

## 2021-09-14 LAB — CBC
HCT: 30.4 % — ABNORMAL LOW (ref 36.0–46.0)
Hemoglobin: 9.9 g/dL — ABNORMAL LOW (ref 12.0–15.0)
MCH: 31.2 pg (ref 26.0–34.0)
MCHC: 32.6 g/dL (ref 30.0–36.0)
MCV: 95.9 fL (ref 80.0–100.0)
Platelets: 232 10*3/uL (ref 150–400)
RBC: 3.17 MIL/uL — ABNORMAL LOW (ref 3.87–5.11)
RDW: 16.6 % — ABNORMAL HIGH (ref 11.5–15.5)
WBC: 11.5 10*3/uL — ABNORMAL HIGH (ref 4.0–10.5)
nRBC: 0 % (ref 0.0–0.2)

## 2021-09-14 LAB — BASIC METABOLIC PANEL
Anion gap: 9 (ref 5–15)
BUN: 55 mg/dL — ABNORMAL HIGH (ref 8–23)
CO2: 24 mmol/L (ref 22–32)
Calcium: 8.2 mg/dL — ABNORMAL LOW (ref 8.9–10.3)
Chloride: 107 mmol/L (ref 98–111)
Creatinine, Ser: 2.85 mg/dL — ABNORMAL HIGH (ref 0.44–1.00)
GFR, Estimated: 15 mL/min — ABNORMAL LOW (ref >60)
Glucose, Bld: 104 mg/dL — ABNORMAL HIGH (ref 70–99)
Potassium: 5 mmol/L (ref 3.5–5.1)
Sodium: 140 mmol/L (ref 135–145)

## 2021-09-14 NOTE — TOC Progression Note (Addendum)
Transition of Care [facility]) - Progression Note    Patient Details  Name: Tanya Koch MRN: [id number] Date of Birth: 1928-11-16  Transition of Care [facility] CM/SW Contact  Joanne Chars, LCSW Phone Number: 09/14/2021, 1:29 PM  Clinical Narrative:    CSW received call from [place] that SNF auth request has been sent for peer to peer.  MD notified.    1025: message from MD that peer to peer complete, denied.  CSW then received call from [place] stating the same.  CSW spoke with pt son Gershon Mussel, updated on the above.  They are looking at 2 memory care options, will choose one this AM.  Discussed right to appeal and they do want to appeal the SNF denial.    1100: CSW called [employer] and initiated the appeal.  CSW told to submit CMS form 1696 along with clincial.    1300: This form is "appointment of representative" and requires pt to sign so as to appt CSW as representative.  CSW called [employer] back, informed them that pt is not oriented, not able to sign form.  Unclear what [employer] was asking for--CSW told that MD can appeal without the form, needs to send new clinical that supports the need for SNF.  CSW faxed most recent clinical to [phone].  CSW received call from son Gershon Mussel asking that CSW call [facility] to provide information for potential admission there.  CSW spoke with Denika, [phone], she requested clinical be faxed to [phone], which was done.  Denika reports RN will come to the hospital this afternoon to complete eval for potential admission.  1330: CSW spoke with son Gershon Mussel and DIL Marcie Bal in room.  [facility] is also sending RN to do eval for potential admission, should be here shortly.    1430: CSW met with pt, Tanya Koch, and Janett Billow from [employer]. [phone].  Email: [email or web address].  [facility] needs updated FL2, diet order form signed by MD, and TB quantiferon test.  They can admit tomorrow if all is in place.  MD informed.   Expected Discharge Plan: Skilled  Nursing Facility Barriers to Discharge: SNF Pending bed offer  Expected Discharge Plan and Services Expected Discharge Plan: [place] In-house Referral: Clinical Social Work   Post Acute Care Choice: [place] Living arrangements for the past 2 months: [place] [facility]) Expected Discharge Date: 09/13/21                                     Social Determinants of Health (SDOH) Interventions    Readmission Risk Interventions     No data to display

## 2021-09-14 NOTE — Progress Notes (Signed)
Physical Therapy Treatment Patient Details Name: Tanya Koch MRN: [id number] DOB: 1928/08/13 Today's Date: 09/14/2021   History of Present Illness Pt is a 88 y.o. female with PMH of bradycardia, history of MI, history of unspecified CHF with recovered EF, COVID-19, emphysema, history of hematemesis, hypertension, dementia, history of closed right hip fracture in December 2022 who had an unwitnessed fall and was found on the floor at her SNF facility this morning with inability to stand up and bear weight on her LLE. Pt s/p left hip hemiarthroplasty performed on 07/14.    PT Comments    Pt making incremental progress towards her physical therapy goals; pt sleeping upon arrival, but easily aroused and agreeable to participate. Pt able to participate in BLE therapeutic exercises for strengthening, static/dynamic balance tasks and transfer training. Pt requiring two person maximal assist for sit to stand transfers. Unable to weight shift or take steps currently. Recommend SNF to address deficits and maximize functional mobility.     Recommendations for follow up therapy are one component of a multi-disciplinary discharge planning process, led by the attending physician.  Recommendations may be updated based on patient status, additional functional criteria and insurance authorization.  Follow Up Recommendations  Skilled nursing-short term rehab (<3 hours/day) Can patient physically be transported by private vehicle: No   Assistance Recommended at Discharge Frequent or constant Supervision/Assistance  Patient can return home with the following A lot of help with walking and/or transfers;A lot of help with bathing/dressing/bathroom;Assistance with cooking/housework;Assistance with feeding;Assist for transportation;Direct supervision/assist for financial management;Direct supervision/assist for medications management   Equipment Recommendations  None recommended by PT    Recommendations for Other  Services       Precautions / Restrictions Precautions Precautions: Fall Precaution Comments: no hip precautions Restrictions Weight Bearing Restrictions: Yes LLE Weight Bearing: Weight bearing as tolerated     Mobility  Bed Mobility Overal bed mobility: Needs Assistance Bed Mobility: Supine to Sit, Sit to Supine     Supine to sit: Mod assist, +2 for physical assistance Sit to supine: Mod assist   General bed mobility comments: assist for BLE management, hand over hand guidance to reach for bed rail, assist at trunk to power up. Upon return to supine, pt initiating scooting back on bed, guidance for trunk to lower down    Transfers Overall transfer level: Needs assistance Equipment used: Rolling walker (2 wheels) Transfers: Sit to/from Stand Sit to Stand: Max assist, +2 physical assistance           General transfer comment: MaxA + 2 to power up to standing x 2, cues for upright posture. Pt demonstrating retropulsion, bilateral knee flexion    Ambulation/Gait               General Gait Details: unable   Stairs             Wheelchair Mobility    Modified Rankin (Stroke Patients Only)       Balance Overall balance assessment: Needs assistance Sitting-balance support: Bilateral upper extremity supported, Feet supported Sitting balance-Leahy Scale: Fair Sitting balance - Comments: Able to progress to min guard assist level, tendency for posterior lean                                    Cognition Arousal/Alertness: Awake/alert Behavior During Therapy: Flat affect Overall Cognitive Status: History of cognitive impairments - at baseline  General Comments: hx of dementia, pleasantly confused.        Exercises General Exercises - Lower Extremity Ankle Circles/Pumps: Both, 10 reps, Supine Long Arc Quad: Both, 10 reps, Seated Heel Slides: Right, 10 reps, Supine Toe Raises: Both, 10  reps, Seated Heel Raises: Both, 10 reps, Seated Other Exercises Other Exercises: Seated: functional reaching with BUE's    General Comments        Pertinent Vitals/Pain Pain Assessment Pain Assessment: Faces Faces Pain Scale: Hurts little more Pain Location: L hip Pain Descriptors / Indicators: Discomfort, Guarding Pain Intervention(s): Monitored during session    Home Living                          Prior Function            PT Goals (current goals can now be found in the care plan section) Acute Rehab PT Goals Patient Stated Goal: unable to state Potential to Achieve Goals: Fair    Frequency    Min 3X/week      PT Plan Current plan remains appropriate    Co-evaluation              AM-PAC PT "6 Clicks" Mobility   Outcome Measure  Help needed turning from your back to your side while in a flat bed without using bedrails?: A Little Help needed moving from lying on your back to sitting on the side of a flat bed without using bedrails?: A Lot Help needed moving to and from a bed to a chair (including a wheelchair)?: Total Help needed standing up from a chair using your arms (e.g., wheelchair or bedside chair)?: Total Help needed to walk in hospital room?: Total Help needed climbing 3-5 steps with a railing? : Total 6 Click Score: 9    End of Session Equipment Utilized During Treatment: Gait belt Activity Tolerance: Patient tolerated treatment well Patient left: in bed;with call bell/phone within reach;with bed alarm set;Other (comment) (in modified chair position) Nurse Communication: Mobility status PT Visit Diagnosis: Other abnormalities of gait and mobility (R26.89);Repeated falls (R29.6);Muscle weakness (generalized) (M62.81);Pain Pain - Right/Left: Left Pain - part of body: Hip     Time: 6811-5726 PT Time Calculation (min) (ACUTE ONLY): 22 min  Charges:  $Therapeutic Exercise: 8-22 mins                     Wyona Almas, PT,  DPT Acute Rehabilitation Services Office [phone]    Deno Etienne 09/14/2021, 9:03 AM

## 2021-09-14 NOTE — NC FL2 (Signed)
[place] LEVEL OF CARE SCREENING TOOL     IDENTIFICATION  Patient Name: Tanya Koch Birthdate: Apr 09, 1928 Sex: female Admission Date (Current Location): 09/08/2021  [facility] and [place] Number:  Herbalist and Address:  [facility], [employer] [address], [place], [place] [address]      Provider Number: [id number]  Attending Physician Name and Address:  Nita Sells, MD  Relative Name and Phone Number:  Shalisa, Mcquade [phone]    Current Level of Care: Hospital Recommended Level of Care: Memory Care Prior Approval Number:    Date Approved/Denied:   PASRR Number: [id number]  Discharge Plan: Other (Comment) (Memory Care)    Current Diagnoses: Patient Active Problem List   Diagnosis Date Noted   Closed left hip fracture, initial encounter ([place]) 09/08/2021   Hypertension 09/08/2021   Emphysema lung ([place]) 09/08/2021   Hyperlipidemia 09/08/2021   Hypothermia 09/08/2021   Normocytic anemia 09/08/2021   Leukocytosis 09/08/2021   Closed right hip fracture, sequela 02/10/2021   Dementia without behavioral disturbance ([place]) 12/05/2020   GERD (gastroesophageal reflux disease) 12/05/2020   Chronic kidney disease (CKD), stage IV (severe) ([place]) 07/30/2020    Orientation RESPIRATION BLADDER Height & Weight     Self  Normal Incontinent Weight: 151 lb 10.8 oz (68.8 kg) Height:  5\' 5"  (165.1 cm)  BEHAVIORAL SYMPTOMS/MOOD NEUROLOGICAL BOWEL NUTRITION STATUS      Continent Diet (Heart Healthy Diet)  AMBULATORY STATUS COMMUNICATION OF NEEDS Skin   Total Care Verbally Surgical wounds                       Personal Care Assistance Level of Assistance  Bathing, Feeding, Dressing, Total care Bathing Assistance: Maximum assistance Feeding assistance: Limited assistance Dressing Assistance: Maximum assistance Total Care Assistance: Maximum assistance   Functional Limitations Info  Sight, Hearing, Speech Sight Info:  Adequate Hearing Info: Impaired Speech Info: Adequate    SPECIAL CARE FACTORS FREQUENCY  PT (By licensed PT), OT (By licensed OT)     PT Frequency: 5x week OT Frequency: 5x week            Contractures Contractures Info: Not present    Additional Factors Info  Code Status, Allergies Code Status Info: DNR Allergies Info: NKA           Current Medications (09/14/2021):  This is the current hospital active medication list Current Facility-Administered Medications  Medication Dose Route Frequency Provider Last Rate Last Admin   acetaminophen (TYLENOL) tablet 325-650 mg  325-650 mg Oral Q6H PRN Leandrew Koyanagi, MD   325 mg at 09/13/21 1252   ALPRAZolam (XANAX) tablet 0.25 mg  0.25 mg Oral BID Leandrew Koyanagi, MD   0.25 mg at 09/14/21 0855   alum & mag hydroxide-simeth (MAALOX/MYLANTA) 200-200-20 MG/5ML suspension 30 mL  30 mL Oral Q4H PRN Leandrew Koyanagi, MD       amLODipine (NORVASC) tablet 10 mg  10 mg Oral Daily Leandrew Koyanagi, MD   10 mg at 09/14/21 0902   calcium carbonate (TUMS - dosed in mg elemental calcium) chewable tablet 1,000 mg  1,000 mg Oral Daily Leandrew Koyanagi, MD   1,000 mg at 09/14/21 0854   Chlorhexidine Gluconate Cloth 2 % PADS 6 each  6 each Topical Q0600 Regalado, Belkys A, MD   6 each at 09/14/21 0452   docusate sodium (COLACE) capsule 100 mg  100 mg Oral BID Leandrew Koyanagi, MD  100 mg at 09/14/21 0857   enoxaparin (LOVENOX) injection 30 mg  30 mg Subcutaneous Q24H Regalado, Belkys A, MD   30 mg at 09/14/21 0903   escitalopram (LEXAPRO) tablet 10 mg  10 mg Oral Daily Leandrew Koyanagi, MD   10 mg at 09/14/21 0903   feeding supplement (ENSURE ENLIVE / ENSURE PLUS) liquid 237 mL  237 mL Oral BID BM Leandrew Koyanagi, MD   237 mL at 09/14/21 1508   ferrous sulfate tablet 325 mg  325 mg Oral Q breakfast Leandrew Koyanagi, MD   325 mg at 52/84/13 2440   folic acid (FOLVITE) tablet 1 mg  1 mg Oral Geanie Cooley, MD   1 mg at 09/13/21 0857   HYDROcodone-acetaminophen (NORCO)  7.5-325 MG per tablet 1-2 tablet  1-2 tablet Oral Q4H PRN Leandrew Koyanagi, MD   1 tablet at 09/12/21 1027   HYDROcodone-acetaminophen (NORCO/VICODIN) 5-325 MG per tablet 1-2 tablet  1-2 tablet Oral Q4H PRN Leandrew Koyanagi, MD       HYDROmorphone (DILAUDID) injection 0.5 mg  0.5 mg Intravenous Q2H PRN Leandrew Koyanagi, MD   0.5 mg at 09/10/21 2243   magnesium oxide (MAG-OX) tablet 400 mg  400 mg Oral BID Leandrew Koyanagi, MD   400 mg at 09/14/21 2536   menthol-cetylpyridinium (CEPACOL) lozenge 3 mg  1 lozenge Oral PRN Leandrew Koyanagi, MD       Or   phenol (CHLORASEPTIC) mouth spray 1 spray  1 spray Mouth/Throat PRN Leandrew Koyanagi, MD       methocarbamol (ROBAXIN) tablet 500 mg  500 mg Oral Q6H PRN Leandrew Koyanagi, MD   500 mg at 09/11/21 2137   Or   methocarbamol (ROBAXIN) 500 mg in dextrose 5 % 50 mL IVPB  500 mg Intravenous Q6H PRN Leandrew Koyanagi, MD       morphine (PF) 2 MG/ML injection 0.5-1 mg  0.5-1 mg Intravenous Q2H PRN Leandrew Koyanagi, MD       multivitamin with minerals tablet 1 tablet  1 tablet Oral Daily Kc, Ramesh, MD   1 tablet at 09/14/21 0855   ondansetron (ZOFRAN) tablet 4 mg  4 mg Oral Q6H PRN Leandrew Koyanagi, MD       Or   ondansetron [facility] injection 4 mg  4 mg Intravenous Q6H PRN Leandrew Koyanagi, MD       Oral care mouth rinse  15 mL Mouth Rinse PRN Kc, Ramesh, MD       oxyCODONE (Oxy IR/ROXICODONE) immediate release tablet 2.5 mg  2.5 mg Oral Q3H PRN Leandrew Koyanagi, MD       pantoprazole (PROTONIX) EC tablet 40 mg  40 mg Oral Daily Leandrew Koyanagi, MD   40 mg at 09/14/21 0855   polyethylene glycol (MIRALAX / GLYCOLAX) packet 17 g  17 g Oral Daily PRN Leandrew Koyanagi, MD       rosuvastatin (CRESTOR) tablet 10 mg  10 mg Oral QHS Leandrew Koyanagi, MD   10 mg at 09/13/21 2104   sorbitol 70 % solution 30 mL  30 mL Oral Daily PRN Leandrew Koyanagi, MD       tranexamic acid (CYKLOKAPRON) 2,000 mg in sodium chloride 0.9 % 50 mL Topical Application  6,440 mg Topical Once Leandrew Koyanagi, MD         Discharge  Medications: Please see discharge summary for a list of discharge medications.  Relevant Imaging Results:  Relevant Lab Results:   Additional Information SSN: [id number], pt is vaccinated for covid with one booster.  Joanne Chars, LCSW

## 2021-09-14 NOTE — Discharge Summary (Signed)
Physician Discharge Summary  Tanya Koch HWE:[id number] DOB: 11-29-1928 DOA: 09/08/2021  I agree with contents of this discharge summary-patient is stabilized to return to memory care unit and get home health therapy at that facility   PCP: Earmon Phoenix, NP  Admit date: 09/08/2021 Discharge date: 09/14/2021 Recommendations for Outpatient Follow-up:  Follow up with PCP in 1 weeks-call for appointment Please obtain BMP/CBC in one week  Discharge Dispo: SNF Discharge Condition: Stable Code Status:   Code Status: DNR Diet recommendation:  Diet Order             Diet - low sodium heart healthy           DIET DYS 3 Room service appropriate? Yes; Fluid consistency: Thin  Diet effective now                    Brief/Interim Summary: bradycardia, history of MI, unspecified heart failure, with recovered ejection fraction, COVID-19, emphysema, hypertension,  history of hematemesis, history of right closed right hip fracture December 2022 who had an unwitnessed fall and was found on the floor at her skilled nursing facility, in ED was found to have left hip fracture,CT cervical spine and CT head with no acute abnormality but show extensive calcific atherosclerosis of both carotid bifurcation-underwent carotid Doppler that shows 1 to 39% stenosis.,  Bilateral vertebral arteries demonstrated antegrade flow.  Patient transferred to [facility] underwent left hip hemiarthroplasty by Dr. Erlinda Hong 7/14.  Underwent 1 unit PRBC transfusion 7/15.Patient remains medically stable.  CKD with a stable renal function leukocytosis downtrending hemoglobin is stable >8 g. Stable for SNF.  Pending bed placement see still for discharge.  Discharge Diagnoses:  Principal Problem:   Closed left hip fracture, initial encounter [facility]) Active Problems:   Dementia without behavioral disturbance (HCC)   GERD (gastroesophageal reflux disease)   Chronic kidney disease (CKD), stage IV (severe) (HCC)   Hypertension   Emphysema  lung (HCC)   Hyperlipidemia   Hypothermia   Normocytic anemia   Leukocytosis  Closed left hip fracture, initial encounter s/p  left hip hemiarthroplasty by Dr. Erlinda Hong 7/14.  Surgically stable orthopedic following, WBAT LLE, DVT prophylaxis with Lovenox/SCD and outpatient follow-up with Dr. Erlinda Hong in 2 weeks.  Continue PT OT pain control and DVT prophylaxis with Lovenox x2 weeks and opiates as prescribed by orthopedics.    Acute blood loss anemia in the setting of anemia chronic disease from ELF:[id number] 1 unit PRBC transfusion 7/15 - hb up in 8.7> 8.3? 8.2. monitor.  Recent Labs  Lab 09/10/21 0141 09/11/21 0228 09/12/21 0142 09/13/21 0236 09/14/21 0158  HGB 7.8* 8.7* 8.3* 9.2* 9.9*  HCT 24.4* 25.8* 25.8* 28.5* 30.4*    Leukocytosis likely reactive in the setting of hip fracture, downtrending.  Check CBC in 1 week Recent Labs  Lab 09/10/21 0141 09/11/21 0228 09/12/21 0142 09/13/21 0236 09/14/21 0158  WBC 12.8* 16.0* 13.5* 13.1* 11.5*     Dementia without behavioral disturbance: Pleasantly confused, continue supportive care fall precaution delirium precaution continue Lexapro/Xanax. GERD-on PPI CKD stage IV creatinine around the baseline.  Resume Lasix Essential hypertension stable on amlodipine.  Hypothermia on admission resolved, chest x-ray UA unremarkable continue warm blanket, TSH euthyroid Emphysema lung-encourage incentive spirometry Bilateral carotid artery disease- ruled on. Seen in CT imaging but Doppler neck unremarkable  Consults: ORTHO Subjective: Resting comfortably pleasantly confused  Discharge Exam: Vitals:   09/14/21 0557 09/14/21 0745  BP: (!) 117/91   Pulse: 62   Resp: 16  Temp:  97.9 F (36.6 C)  SpO2: 100%    General: Pt is alert, awake-not completely coherent, otherwise stable Cardiovascular: RRR, S1/S2 +, no rubs, no gallops Respiratory: CTA bilaterally, no wheezing, no rhonchi Abdominal: Soft, NT, ND, bowel sounds + Extremities: no edema, no  cyanosis  Discharge Instructions  Discharge Instructions     Diet - low sodium heart healthy   Complete by: As directed    Discharge instructions   Complete by: As directed    Check CBC and BMP in 1 week Follow-up with orthopedic surgery 2 weeks from day of discharge  Please call call MD or return to ER for similar or worsening recurring problem that brought you to hospital or if any fever,nausea/vomiting,abdominal pain, uncontrolled pain, chest pain,  shortness of breath or any other alarming symptoms.  Please follow-up your doctor as instructed in a week time and call the office for appointment.  Please avoid alcohol, smoking, or any other illicit substance and maintain healthy habits including taking your regular medications as prescribed.  You were cared for by a hospitalist during your hospital stay. If you have any questions about your discharge medications or the care you received while you were in the hospital after you are discharged, you can call the unit and ask to speak with the hospitalist on call if the hospitalist that took care of you is not available.  Once you are discharged, your primary care physician will handle any further medical issues. Please note that NO REFILLS for any discharge medications will be authorized once you are discharged, as it is imperative that you return to your primary care physician (or establish a relationship with a primary care physician if you do not have one) for your aftercare needs so that they can reassess your need for medications and monitor your lab values   Discharge wound care:   Complete by: As directed    Reinforce dressing as needed and follow-up instruction provided   Increase activity slowly   Complete by: As directed    Weight bearing as tolerated   Complete by: As directed       Allergies as of 09/14/2021   No Known Allergies      Medication List     TAKE these medications    acetaminophen 325 MG tablet Commonly  known as: TYLENOL Take 650 mg by mouth every 6 (six) hours as needed for moderate pain.   ALPRAZolam 0.25 MG tablet Commonly known as: XANAX Take 1 tablet (0.25 mg total) by mouth 2 (two) times daily for 4 doses. May also have 1 tablet daily as needed for anxiety   amLODipine 10 MG tablet Commonly known as: NORVASC Take 10 mg by mouth daily.   calcium carbonate 500 MG chewable tablet Commonly known as: TUMS - dosed in mg elemental calcium Chew 1,000 mg by mouth daily.   docusate sodium 100 MG capsule Commonly known as: COLACE Take 100 mg by mouth at bedtime.   enoxaparin 40 MG/0.4ML injection Commonly known as: LOVENOX Inject 0.4 mLs (40 mg total) into the skin daily for 14 days.   Ensure Take 237 mLs by mouth 2 (two) times daily between meals. What changed: Another medication with the same name was added. Make sure you understand how and when to take each.   feeding supplement Liqd Take 237 mLs by mouth 2 (two) times daily between meals. What changed: You were already taking a medication with the same name, and this prescription was added. Make  sure you understand how and when to take each.   escitalopram 10 MG tablet Commonly known as: LEXAPRO Take 10 mg by mouth daily.   ferrous sulfate 325 (65 FE) MG tablet Take 325 mg by mouth daily with breakfast.   folic acid 1 MG tablet Commonly known as: FOLVITE Take 1 mg by mouth every other day.   furosemide 20 MG tablet Commonly known as: LASIX Take 20 mg by mouth daily.   HYDROcodone-acetaminophen 5-325 MG tablet Commonly known as: Norco Take 1 tablet by mouth every 6 (six) hours as needed.   magnesium oxide 400 (240 Mg) MG tablet Commonly known as: MAG-OX Take 400 mg by mouth 2 (two) times daily.   ondansetron 4 MG tablet Commonly known as: ZOFRAN Take 1 tablet (4 mg total) by mouth every 8 (eight) hours as needed for nausea or vomiting.   pantoprazole 40 MG tablet Commonly known as: PROTONIX Take 40 mg by  mouth daily.   polyethylene glycol 17 g packet Commonly known as: MIRALAX / GLYCOLAX Take 17 g by mouth daily as needed for mild constipation.   rosuvastatin 10 MG tablet Commonly known as: CRESTOR Take 10 mg by mouth at bedtime.   vitamin B-12 1000 MCG tablet Commonly known as: CYANOCOBALAMIN Take 2,000 mcg by mouth daily.   Zinc 30 MG Tabs Take 30 mg by mouth daily.               Discharge Care Instructions  (From admission, onward)           Start     Ordered   09/13/21 0000  Discharge wound care:       Comments: Reinforce dressing as needed and follow-up instruction provided   09/13/21 1055   09/09/21 0000  Weight bearing as tolerated        09/09/21 1254            Follow-up Information     Leandrew Koyanagi, MD Follow up in 2 week(s).   Specialty: Orthopedic Surgery Why: For suture removal, For wound re-check Contact information: [place] [address] [phone]         Earmon Phoenix, NP Follow up in 1 week(s).   Specialty: Gerontology Contact information: [place] [address] [phone]         Early Osmond, MD .   Specialty: Cardiology Contact information: [place] [address] [phone]                No Known Allergies  The results of significant diagnostics from this hospitalization (including imaging, microbiology, ancillary and laboratory) are listed below for reference.    Microbiology: Recent Results (from the past 240 hour(s))  Surgical pcr screen     Status: Abnormal   Collection Time: 09/09/21  9:59 AM   Specimen: Nasal Mucosa; Nasal Swab  Result Value Ref Range Status   MRSA, PCR NEGATIVE NEGATIVE Final   Staphylococcus aureus POSITIVE (A) NEGATIVE Final    Comment: (NOTE) The Xpert SA Assay (FDA approved for NASAL specimens in patients 15 years of age and older), is one component of a comprehensive surveillance program. It is not  intended to diagnose infection nor to guide or monitor treatment. Performed at [facility], [place] [address], [place], [place] [address]     Procedures/Studies: Pelvis Portable  Result Date: 09/09/2021 CLINICAL DATA:  A 88 year old presents for follow-up following hip arthroplasty. EXAM: PORTABLE PELVIS 1-2  VIEWS COMPARISON:  CT of the pelvis of July of 2013. FINDINGS: Immediate postoperative appearance of the LEFT hip following hip arthroplasty with no unexpected radiographic abnormality. Soft tissue gas over the LEFT hip. Osteopenia without signs of complication on AP views. Incidental note is made again of fracture of the RIGHT greater trochanter which is a chronic finding. IMPRESSION: 1. Immediate postoperative appearance following LEFT hip arthroplasty without signs of complication on AP views. 2. Chronic RIGHT greater trochanter fracture. Electronically Signed   By: Zetta Bills M.D.   On: 09/09/2021 13:45   DG HIP UNILAT WITH PELVIS 1V LEFT  Result Date: 09/09/2021 CLINICAL DATA:  Fracture left hip, fluoroscopic assistance for left hip arthroplasty EXAM: DG HIP (WITH OR WITHOUT PELVIS) 1V*L* COMPARISON:  09/08/2021 FINDINGS: Fluoroscopic images show left hip arthroplasty. Fluoroscopic time was 9 seconds. Radiation dose 0.34 mGy. IMPRESSION: Fluoroscopic assistance was provided for left hip arthroplasty. Electronically Signed   By: Elmer Picker M.D.   On: 09/09/2021 12:55   DG C-Arm 1-60 Min-No Report  Result Date: 09/09/2021 Fluoroscopy was utilized by the requesting physician.  No radiographic interpretation.   VAS US CAROTID  Result Date: 09/08/2021 Carotid Arterial Duplex Study Patient Name:  MAKAYIA DUPLESSIS  Date of Exam:   09/08/2021 Medical Rec #: [id number]       Accession #:    [id number] Date of Birth: 1928-08-10       Patient Gender: F Patient Age:   11 years Exam Location:  [facility] Procedure:      VAS US CAROTID Referring Phys: Shanon Brow ORTIZ  --------------------------------------------------------------------------------  Indications:       Atherosclerosis by CT. Risk Factors:      Hypertension, hyperlipidemia. Other Factors:     Extensive calcific atherosclerosis at the bilateral carotid                    bifurcations by CT, unable to have CTA secondary to CKD.                    Atrial fibrillation. Status post fall resulting in hip                    fracture. Limitations        Today's exam was limited due to tachycardia and arrythmia. Comparison Study:  No prior study on file Performing Technologist: Sharion Dove RVS  Examination Guidelines: A complete evaluation includes B-mode imaging, spectral Doppler, color Doppler, and power Doppler as needed of all accessible portions of each vessel. Bilateral testing is considered an integral part of a complete examination. Limited examinations for reoccurring indications may be performed as noted.  Right Carotid Findings: +----------+--------+--------+--------+------------------+---------+           PSV cm/sEDV cm/sStenosisPlaque DescriptionComments  +----------+--------+--------+--------+------------------+---------+ CCA Prox  47      8               heterogenous                +----------+--------+--------+--------+------------------+---------+ CCA Distal36      8               heterogenous                +----------+--------+--------+--------+------------------+---------+ ICA Prox  111     39              calcific          Shadowing +----------+--------+--------+--------+------------------+---------+ ICA Mid   87  28                                          +----------+--------+--------+--------+------------------+---------+ ICA Distal85      20                                          +----------+--------+--------+--------+------------------+---------+ ECA       67      5                                            +----------+--------+--------+--------+------------------+---------+ +----------+--------+-------+--------+-------------------+           PSV cm/sEDV cmsDescribeArm Pressure (mmHG) +----------+--------+-------+--------+-------------------+ [id number]                                        +----------+--------+-------+--------+-------------------+ +---------+--------+--+--------+-+ VertebralPSV cm/s26EDV cm/s4 +---------+--------+--+--------+-+  Left Carotid Findings: +----------+--------+--------+--------+------------------+---------+           PSV cm/sEDV cm/sStenosisPlaque DescriptionComments  +----------+--------+--------+--------+------------------+---------+ CCA Prox  65      7               heterogenous                +----------+--------+--------+--------+------------------+---------+ CCA Distal61      13              heterogenous                +----------+--------+--------+--------+------------------+---------+ ICA Prox  77      20              calcific          Shadowing +----------+--------+--------+--------+------------------+---------+ ICA Mid   109     26                                          +----------+--------+--------+--------+------------------+---------+ ICA Distal55      13                                          +----------+--------+--------+--------+------------------+---------+ ECA       36      2                                           +----------+--------+--------+--------+------------------+---------+ +----------+--------+--------+--------+-------------------+           PSV cm/sEDV cm/sDescribeArm Pressure (mmHG) +----------+--------+--------+--------+-------------------+ Subclavian80                                          +----------+--------+--------+--------+-------------------+ +---------+--------+--+--------+--+ VertebralPSV cm/s54EDV cm/s12 +---------+--------+--+--------+--+   Summary:  Right Carotid: Velocities in the right ICA are consistent with a 1-39% stenosis. Left Carotid: Velocities in the left ICA are consistent with  a 1-39% stenosis. Vertebrals:  Bilateral vertebral arteries demonstrate antegrade flow. Subclavians: Normal flow hemodynamics were seen in bilateral subclavian              arteries. *See table(s) above for measurements and observations.  Electronically signed by Orlie Pollen on 09/08/2021 at 4:48:43 PM.    Final    DG Chest 2 View  Result Date: 09/08/2021 CLINICAL DATA:  Fall, dementia, left hip fracture EXAM: CHEST - 2 VIEW COMPARISON:  02/10/2021 FINDINGS: Similar cardiomegaly and central vascular congestion. Negative for CHF or definite pneumonia. Minor basilar atelectasis. No effusion or pneumothorax. Trachea midline. Aorta atherosclerotic. No acute osseous finding. Degenerative changes throughout the spine. IMPRESSION: Cardiomegaly without acute process Aortic Atherosclerosis (ICD10-I70.0). Electronically Signed   By: Jerilynn Mages.  Shick M.D.   On: 09/08/2021 10:23   DG Knee Complete 4 Views Right  Result Date: 09/08/2021 CLINICAL DATA:  Fall, left hip fracture EXAM: RIGHT KNEE - COMPLETE 4+ VIEW COMPARISON:  09/08/2021 FINDINGS: Bones are osteopenic. Diffuse right knee degenerative changes and chondrocalcinosis. No acute osseous finding, fracture, malalignment or effusion. Peripheral atherosclerosis. No focal soft tissue abnormality. IMPRESSION: Osteopenia and degenerative changes. No acute finding by plain radiography Electronically Signed   By: Jerilynn Mages.  Shick M.D.   On: 09/08/2021 10:21   DG Hip Unilat W or Wo Pelvis 2-3 Views Left  Result Date: 09/08/2021 CLINICAL DATA:  Unwitnessed fall, pain EXAM: DG HIP (WITH OR WITHOUT PELVIS) 2-3V LEFT COMPARISON:  09/08/2021 FINDINGS: There is an acute displaced and mildly impacted left hip subcapital femoral neck fracture noted. Bones are osteopenic. Pelvis and right hip appear intact. Peripheral atherosclerosis noted.  IMPRESSION: Acute displaced left hip subcapital femoral neck fracture. Electronically Signed   By: Jerilynn Mages.  Shick M.D.   On: 09/08/2021 10:20   DG Knee Complete 4 Views Left  Result Date: 09/08/2021 CLINICAL DATA:  Unwitnessed fall, dementia EXAM: LEFT KNEE - COMPLETE 4+ VIEW COMPARISON:  09/08/2021 FINDINGS: Bones are osteopenic. Degenerative changes throughout the knee and chondrocalcinosis. No acute osseous finding, fracture, malalignment, or effusion. Patella located. No soft tissue abnormality. Peripheral vascular calcifications present. IMPRESSION: Osteopenia and degenerative changes as above. No acute osseous finding. Peripheral atherosclerosis Electronically Signed   By: Jerilynn Mages.  Shick M.D.   On: 09/08/2021 10:17   CT Head Wo Contrast  Result Date: 09/08/2021 CLINICAL DATA:  Neck trauma (Age >= 65y); Head trauma, moderate-severe EXAM: CT HEAD WITHOUT CONTRAST CT CERVICAL SPINE WITHOUT CONTRAST TECHNIQUE: Multidetector CT imaging of the head and cervical spine was performed following the standard protocol without intravenous contrast. Multiplanar CT image reconstructions of the cervical spine were also generated. RADIATION DOSE REDUCTION: This exam was performed according to the departmental dose-optimization program which includes automated exposure control, adjustment of the mA and/or kV according to patient size and/or use of iterative reconstruction technique. COMPARISON:  None Available. FINDINGS: CT HEAD FINDINGS Brain: No evidence of acute infarction, hemorrhage, hydrocephalus, extra-axial collection or mass lesion/mass effect. Cerebral atrophy. Vascular: Calcific intracranial atherosclerosis. Skull: No acute fracture. Sinuses/Orbits: Clear sinuses.  No acute orbital findings. Other: No mastoid effusions. CT CERVICAL SPINE FINDINGS Alignment: Unchanged alignment. Similar slight anterolisthesis of C7 on T1. Straightening. Skull base and vertebrae: Vertebral body heights are maintained. No evidence of  acute fracture. Soft tissues and spinal canal: No prevertebral fluid or swelling. No visible canal hematoma. Disc levels: Multilevel degenerative change including degenerative disc disease, facet and uncovertebral hypertrophy with varying degrees of neural foraminal stenosis. Upper chest: Visualized lung apices are clear. Other: Extensive  calcific atherosclerosis of the right greater than left carotid bifurcations. Subcentimeter thyroid nodules. Further imaging is not required (ref: J Am Coll Radiol. 2015 Feb;12(2): 143-50). IMPRESSION: 1. No evidence of acute intracranial abnormality. 2. No evidence of acute fracture or traumatic malalignment in the cervical spine. 3. Extensive calcific atherosclerosis of the right greater than left carotid bifurcations. Potentially hemodynamically significant stenosis on the right. A CT of the neck could further characterize if clinically warranted. Electronically Signed   By: Margaretha Sheffield M.D.   On: 09/08/2021 09:59   CT Cervical Spine Wo Contrast  Result Date: 09/08/2021 CLINICAL DATA:  Neck trauma (Age >= 65y); Head trauma, moderate-severe EXAM: CT HEAD WITHOUT CONTRAST CT CERVICAL SPINE WITHOUT CONTRAST TECHNIQUE: Multidetector CT imaging of the head and cervical spine was performed following the standard protocol without intravenous contrast. Multiplanar CT image reconstructions of the cervical spine were also generated. RADIATION DOSE REDUCTION: This exam was performed according to the departmental dose-optimization program which includes automated exposure control, adjustment of the mA and/or kV according to patient size and/or use of iterative reconstruction technique. COMPARISON:  None Available. FINDINGS: CT HEAD FINDINGS Brain: No evidence of acute infarction, hemorrhage, hydrocephalus, extra-axial collection or mass lesion/mass effect. Cerebral atrophy. Vascular: Calcific intracranial atherosclerosis. Skull: No acute fracture. Sinuses/Orbits: Clear sinuses.   No acute orbital findings. Other: No mastoid effusions. CT CERVICAL SPINE FINDINGS Alignment: Unchanged alignment. Similar slight anterolisthesis of C7 on T1. Straightening. Skull base and vertebrae: Vertebral body heights are maintained. No evidence of acute fracture. Soft tissues and spinal canal: No prevertebral fluid or swelling. No visible canal hematoma. Disc levels: Multilevel degenerative change including degenerative disc disease, facet and uncovertebral hypertrophy with varying degrees of neural foraminal stenosis. Upper chest: Visualized lung apices are clear. Other: Extensive calcific atherosclerosis of the right greater than left carotid bifurcations. Subcentimeter thyroid nodules. Further imaging is not required (ref: J Am Coll Radiol. 2015 Feb;12(2): 143-50). IMPRESSION: 1. No evidence of acute intracranial abnormality. 2. No evidence of acute fracture or traumatic malalignment in the cervical spine. 3. Extensive calcific atherosclerosis of the right greater than left carotid bifurcations. Potentially hemodynamically significant stenosis on the right. A CT of the neck could further characterize if clinically warranted. Electronically Signed   By: Margaretha Sheffield M.D.   On: 09/08/2021 09:59   CT PELVIS WO CONTRAST  Result Date: 09/08/2021 CLINICAL DATA:  Hip trauma, fracture suspected, xray done EXAM: CT PELVIS WITHOUT CONTRAST TECHNIQUE: Multidetector CT imaging of the pelvis was performed following the standard protocol without intravenous contrast. RADIATION DOSE REDUCTION: This exam was performed according to the departmental dose-optimization program which includes automated exposure control, adjustment of the mA and/or kV according to patient size and/or use of iterative reconstruction technique. COMPARISON:  None Available. FINDINGS: Urinary Tract:  Distended bladder. Bowel:  Unremarkable visualized pelvic bowel loops. Vascular/Lymphatic: Aorto bi-iliac atherosclerosis. Reproductive:  No  mass or other significant abnormality Musculoskeletal: Potentially acute or subacute impacted left subcapital femoral neck fracture. Approximately half shaft with superior displacement of the distal fracture fragment. Osteopenia. IMPRESSION: 1. Potentially acute or subacute impacted left subcapital femoral neck fracture. Approximately half shaft with superior displacement of the distal fracture fragment. 2. Osteopenia. 3. Aortic atherosclerosis (ICD10-I70.0). Electronically Signed   By: Margaretha Sheffield M.D.   On: 09/08/2021 09:54   DG SWALLOW FUNC OP MEDICARE SPEECH PATH  Result Date: 08/16/2021 CLINICAL DATA:  Dysphagia, regurgitation of foods, feeding difficulties, cough EXAM: MODIFIED BARIUM SWALLOW TECHNIQUE: Different consistencies of barium were administered orally  to the patient by the Speech Pathologist. Imaging of the pharynx was performed in the lateral projection and minimal AP views were also obtained. Hayley Boisseau, PA-C was present in the fluoroscopy room during this study, which was supervised and interpreted by Maurine Simmering, MD. FLUOROSCOPY: Radiation Exposure Index (as provided by the fluoroscopic device): 37.45 mGy [place] COMPARISON:  None Available. FINDINGS: Vestibular Penetration: Only one occurrence during multiple trials of thin consistency barium. Aspiration:  None seen. Other: Tortuous esophagus with severe dysmotility. There is a pulsion diverticulum noted in the distal 1/3 of the esophagus. A 1/2 inch barium tablet is delayed in transit just proximal to the diverticulum. IMPRESSION: Severe esophageal dysmotility with delayed passage of a 13 mm barium tablet just above a posterior pulsion diverticulum in the distal esophagus. This is likely due to dysmotility but a stricture is possible. Recommend correlation with endoscopy. Note that an esophagram would not add any specificity in this case and is not recommended. No intratracheal aspiration. Please refer to the Speech Pathologists  report for complete details and recommendations. Electronically Signed   By: Maurine Simmering M.D.   On: 08/16/2021 13:08    Labs: BNP (last 3 results) No results for input(s): "BNP" in the last 8760 hours. Basic Metabolic Panel: Recent Labs  Lab 09/10/21 0141 09/11/21 0228 09/12/21 0142 09/13/21 0236 09/14/21 0158  NA 138 137 137 138 140  K 4.3 4.1 4.4 4.8 5.0  CL 105 104 107 108 107  CO2 21* 21* 21* 22 24  GLUCOSE 142* 117* 116* 104* 104*  BUN 55* 57* 57* 56* 55*  CREATININE 2.46* 2.65* 2.56* 2.47* 2.85*  CALCIUM 8.1* 8.1* 8.1* 8.2* 8.2*    Liver Function Tests: Recent Labs  Lab 09/09/21 0435  AST 23  ALT 16  ALKPHOS 42  BILITOT 0.9  PROT 6.2*  ALBUMIN 2.6*    No results for input(s): "LIPASE", "AMYLASE" in the last 168 hours. No results for input(s): "AMMONIA" in the last 168 hours. CBC: Recent Labs  Lab 09/08/21 1106 09/09/21 0435 09/10/21 0141 09/11/21 0228 09/12/21 0142 09/13/21 0236 09/14/21 0158  WBC 14.0*   < > 12.8* 16.0* 13.5* 13.1* 11.5*  NEUTROABS 11.4*  --   --   --   --   --   --   HGB 10.5*   < > 7.8* 8.7* 8.3* 9.2* 9.9*  HCT 32.6*   < > 24.4* 25.8* 25.8* 28.5* 30.4*  MCV 98.5   < > 98.4 92.8 96.6 96.0 95.9  PLT 240   < > 241 211 196 220 232   < > = values in this interval not displayed.    Cardiac Enzymes: No results for input(s): "CKTOTAL", "CKMB", "CKMBINDEX", "TROPONINI" in the last 168 hours. BNP: Invalid input(s): "POCBNP" CBG: Recent Labs  Lab 09/11/21 1201  GLUCAP 104*    D-Dimer No results for input(s): "DDIMER" in the last 72 hours. Hgb A1c No results for input(s): "HGBA1C" in the last 72 hours. Lipid Profile No results for input(s): "CHOL", "HDL", "LDLCALC", "TRIG", "CHOLHDL", "LDLDIRECT" in the last 72 hours. Thyroid function studies No results for input(s): "TSH", "T4TOTAL", "T3FREE", "THYROIDAB" in the last 72 hours.  Invalid input(s): "FREET3" Anemia work up No results for input(s): "VITAMINB12", "FOLATE",  "FERRITIN", "TIBC", "IRON", "RETICCTPCT" in the last 72 hours. Urinalysis    Component Value Date/Time   COLORURINE YELLOW 09/08/2021 2132   APPEARANCEUR HAZY (A) 09/08/2021 2132   LABSPEC 1.012 09/08/2021 2132   PHURINE 6.0 09/08/2021 2132  GLUCOSEU NEGATIVE 09/08/2021 2132   HGBUR SMALL (A) 09/08/2021 2132   BILIRUBINUR NEGATIVE 09/08/2021 2132   KETONESUR NEGATIVE 09/08/2021 2132   PROTEINUR 100 (A) 09/08/2021 2132   NITRITE NEGATIVE 09/08/2021 2132   LEUKOCYTESUR NEGATIVE 09/08/2021 2132   Sepsis Labs Recent Labs  Lab 09/11/21 0228 09/12/21 0142 09/13/21 0236 09/14/21 0158  WBC 16.0* 13.5* 13.1* 11.5*    Microbiology Recent Results (from the past 240 hour(s))  Surgical pcr screen     Status: Abnormal   Collection Time: 09/09/21  9:59 AM   Specimen: Nasal Mucosa; Nasal Swab  Result Value Ref Range Status   MRSA, PCR NEGATIVE NEGATIVE Final   Staphylococcus aureus POSITIVE (A) NEGATIVE Final    Comment: (NOTE) The Xpert SA Assay (FDA approved for NASAL specimens in patients 66 years of age and older), is one component of a comprehensive surveillance program. It is not intended to diagnose infection nor to guide or monitor treatment. Performed at [facility], [place] [address], [place], [place] [address]      Time coordinating discharge: 35 minutes  SIGNED: Nita Sells, MD  [facility] 09/14/2021, 10:46 AM  If 7PM-7AM, please contact night-coverage [email or web address]

## 2021-09-14 NOTE — TOC CAGE-AID Note (Signed)
Transition of Care [facility]) - CAGE-AID Screening   Patient Details  Name: Tanya Koch MRN: [id number] Date of Birth: 28-Jun-1928  Clinical Narrative:  Patient with baseline dementia prior to admission, unable to participate in screening at this time.  CAGE-AID Screening: Substance Abuse Screening unable to be completed due to: : Patient unable to participate

## 2021-09-15 LAB — CBC
HCT: 29.8 % — ABNORMAL LOW (ref 36.0–46.0)
Hemoglobin: 9.5 g/dL — ABNORMAL LOW (ref 12.0–15.0)
MCH: 30.8 pg (ref 26.0–34.0)
MCHC: 31.9 g/dL (ref 30.0–36.0)
MCV: 96.8 fL (ref 80.0–100.0)
Platelets: 254 10*3/uL (ref 150–400)
RBC: 3.08 MIL/uL — ABNORMAL LOW (ref 3.87–5.11)
RDW: 16.6 % — ABNORMAL HIGH (ref 11.5–15.5)
WBC: 11.4 10*3/uL — ABNORMAL HIGH (ref 4.0–10.5)
nRBC: 0 % (ref 0.0–0.2)

## 2021-09-15 LAB — BASIC METABOLIC PANEL
Anion gap: 8 (ref 5–15)
BUN: 51 mg/dL — ABNORMAL HIGH (ref 8–23)
CO2: 25 mmol/L (ref 22–32)
Calcium: 8.4 mg/dL — ABNORMAL LOW (ref 8.9–10.3)
Chloride: 107 mmol/L (ref 98–111)
Creatinine, Ser: 2.32 mg/dL — ABNORMAL HIGH (ref 0.44–1.00)
GFR, Estimated: 19 mL/min — ABNORMAL LOW (ref >60)
Glucose, Bld: 100 mg/dL — ABNORMAL HIGH (ref 70–99)
Potassium: 4.7 mmol/L (ref 3.5–5.1)
Sodium: 140 mmol/L (ref 135–145)

## 2021-09-15 MED ORDER — METOPROLOL TARTRATE 12.5 MG HALF TABLET
12.5000 mg | ORAL_TABLET | Freq: Two times a day (BID) | ORAL | Status: DC
  Administered 2021-09-15 – 2021-09-18 (×7): 12.5 mg via ORAL
  Filled 2021-09-15 (×7): qty 1

## 2021-09-15 NOTE — Progress Notes (Signed)
Occupational Therapy Treatment Patient Details Name: Tanya Koch MRN: [id number] DOB: [birth year] Today's Date: 09/15/2021   History of present illness Pt is a 88 y.o. female with PMH of bradycardia, history of MI, history of unspecified CHF with recovered EF, COVID-19, emphysema, history of hematemesis, hypertension, dementia, history of closed right hip fracture in December 2022 who had an unwitnessed fall and was found on the floor at her SNF facility this morning with inability to stand up and bear weight on her LLE. Pt s/p left hip hemiarthroplasty performed on 07/14.   OT comments  Pt remains pleasantly confused requiring consistent sequencing and initiation cues throughout. Overall, pt continues to require Max A for rolling side to side in bed and Max A x 2 to stand in [place] for transfer to chair. Once up in chair, pt requires assist for basic grooming tasks d/t confusion on what deodorant was for. However, pt did demo good initiation of self feeding breakfast with assist only to open containers. Continue to rec SNF at DC though noted family also looking into memory care facilities. Will continue to follow acutely and progress independence within pt tolerance.   Recommendations for follow up therapy are one component of a multi-disciplinary discharge planning process, led by the attending physician.  Recommendations may be updated based on patient status, additional functional criteria and insurance authorization.    Follow Up Recommendations  Skilled nursing-short term rehab (<3 hours/day)    Assistance Recommended at Discharge Frequent or constant Supervision/Assistance  Patient can return home with the following  A lot of help with bathing/dressing/bathroom;Two people to help with walking and/or transfers;Assistance with cooking/housework;Assistance with feeding;Direct supervision/assist for medications management;Direct supervision/assist for financial management;Assist for  transportation;Help with stairs or ramp for entrance   Equipment Recommendations  Other (comment) (defer to next venue)    Recommendations for Other Services      Precautions / Restrictions Precautions Precautions: Fall Precaution Comments: no hip precautions Restrictions Weight Bearing Restrictions: Yes LLE Weight Bearing: Weight bearing as tolerated       Mobility Bed Mobility Overal bed mobility: Needs Assistance Bed Mobility: Rolling, Supine to Sit Rolling: Max assist   Supine to sit: Max assist, +2 for physical assistance     General bed mobility comments: Max A to roll side to side for bathing task with max multimodal cues and assist to initiate tasks withpt able to reach/hold bedrail. Max A x 2 to sit EOB with poor initiation and motor planning    Transfers Overall transfer level: Needs assistance Equipment used: Ambulation equipment used Transfers: Bed to chair/wheelchair/BSC, Sit to/from Stand Sit to Stand: Max assist, +2 physical assistance           General transfer comment: MaxA + 2 to power up to standing x 2, cues for upright posture. Able to reach to [facility] bars when cued though difficulty understanding option to sit on Stedy pads and rest knees against lower pad Transfer via Lift Equipment: Stedy   Balance Overall balance assessment: Needs assistance Sitting-balance support: Bilateral upper extremity supported, Feet supported Sitting balance-Leahy Scale: Fair     Standing balance support: Bilateral upper extremity supported Standing balance-Leahy Scale: Zero                             ADL either performed or assessed with clinical judgement   ADL Overall ADL's : Needs assistance/impaired Eating/Feeding: Set up;Sitting Eating/Feeding Details (indicate cue type and reason): assist to open  containers, prep coffee and place within reach. pt automatically reaching for utensils to take out of wrapping and began self feeding without  issue Grooming: Minimal assistance;Sitting;Applying deodorant Grooming Details (indicate cue type and reason): assist for deodorant, required education on purpose of deodorant Upper Body Bathing: Moderate assistance;Bed level                             General ADL Comments: NT present finishing bath bed level on entry, requires increased assist for LB ADLs d/t pain, cognition and impaired standing abilities - bed level LB ADLs safest option at this time.    Extremity/Trunk Assessment Upper Extremity Assessment Upper Extremity Assessment: Generalized weakness   Lower Extremity Assessment Lower Extremity Assessment: Defer to PT evaluation        Vision   Vision Assessment?: No apparent visual deficits   Perception     Praxis      Cognition Arousal/Alertness: Awake/alert Behavior During Therapy: Flat affect Overall Cognitive Status: History of cognitive impairments - at baseline                                 General Comments: hx of dementia, pleasantly confused. requires multimodal cues to follow directions though also impacted by [facility]. Pt confused by offer for deodorant, required educated on deodorant purpose        Exercises      Shoulder Instructions       General Comments HR a fib 60-90s    Pertinent Vitals/ Pain       Pain Assessment Pain Assessment: Faces Faces Pain Scale: Hurts little more Pain Location: L hip Pain Descriptors / Indicators: Grimacing, Guarding Pain Intervention(s): Monitored during session, Premedicated before session  Home Living                                          Prior Functioning/Environment              Frequency  Min 2X/week        Progress Toward Goals  OT Goals(current goals can now be found in the care plan section)  Progress towards OT goals: Progressing toward goals  Acute Rehab OT Goals Patient Stated Goal: eat breakfast OT Goal Formulation: Patient unable to  participate in goal setting Time For Goal Achievement: 09/24/21 Potential to Achieve Goals: Fair ADL Goals Pt Will Perform Grooming: with supervision;sitting Pt Will Perform Upper Body Dressing: with caregiver independent in assisting;sitting;standing Pt Will Perform Lower Body Dressing: with caregiver independent in assisting;sit to/from stand;sitting/lateral leans;bed level Pt Will Transfer to Toilet: squat pivot transfer;bedside commode;with mod assist;with +2 assist  Plan Discharge plan remains appropriate;Frequency remains appropriate    Co-evaluation                 AM-PAC OT "6 Clicks" Daily Activity     Outcome Measure   Help from another person eating meals?: A Little Help from another person taking care of personal grooming?: A Little Help from another person toileting, which includes using toliet, bedpan, or urinal?: Total Help from another person bathing (including washing, rinsing, drying)?: A Lot Help from another person to put on and taking off regular upper body clothing?: A Lot Help from another person to put on and taking off regular lower body clothing?:  Total 6 Click Score: 12    End of Session Equipment Utilized During Treatment: Gait belt;Other (comment) (stedy)  OT Visit Diagnosis: Unsteadiness on feet (R26.81);Other abnormalities of gait and mobility (R26.89);Muscle weakness (generalized) (M62.81)   Activity Tolerance Patient tolerated treatment well;Other (comment) (limited by cognition)   Patient Left in chair;with call bell/phone within reach;with chair alarm set   Nurse Communication Mobility status        Time: [phone] OT Time Calculation (min): 25 min  Charges: OT General Charges $OT Visit: 1 Visit OT Treatments $Self Care/Home Management : 8-22 mins $Therapeutic Activity: 8-22 mins  Malachy Chamber, OTR/L Acute Rehab Services Office: [phone]   Layla Maw 09/15/2021, 10:28 AM

## 2021-09-15 NOTE — Progress Notes (Signed)
Patient seen and examined agree with prior plan she is pleasantly confused-she is stable for discharge whenever bed is available  [name], MD [facility] 9:08 AM

## 2021-09-15 NOTE — Progress Notes (Signed)
Speech Language Pathology Treatment: Dysphagia  Patient Details Name: Tanya Koch MRN: [id number] DOB: [birth year] Today's Date: 09/15/2021 Time: 1836-7255 SLP Time Calculation (min) (ACUTE ONLY): 10 min  Assessment / Plan / Recommendation Clinical Impression  Pt seen for ongoing dysphagia management. Per RN report pt with good PO intake and independently feed herself without difficulty.  Pt tolerated thin liquid by straw and regular texture solids today without any clinical s/s of aspiration. No reports of symptoms c/w esophageal dysphagia from pt this session, or from RN. Discussed possibility of upgraded texture with pt who is unable to express a preference.  Given that pt has been extremely successful with self-feeding with current diet and appears to be meeting her nutritional needs, as well as results from [facility] in June, will continue mechanical soft texture at this time.  Pt may benefit from reassessment at next level of care if this is not consistent with her baseline. Pt has no further ST needs. SLP will sign off at this time.   Recommend continuing mechanical soft solids with thin liquids.     HPI HPI: Pt is a 88 yo female admitted after unwitnessed fall with L hip fx s/p prostehtic replacement for L femoral neck fx on 7/14. OP MBS 08/16/21 whoed flash penetration that was WNL for age but with concerns for reduced clearance of the esophagus. Dys 3 diet and thin liquids with meds crushed in puree recommended, as well as consideration for GI consult. PMH  also includes: dementia, COPD/emphysema, former smoker, CAD, bradycardia, CHF, HTN, COVID      SLP Plan  All goals met;Discharge SLP treatment due to (comment)      Recommendations for follow up therapy are one component of a multi-disciplinary discharge planning process, led by the attending physician.  Recommendations may be updated based on patient status, additional functional criteria and insurance authorization.     Recommendations  Diet recommendations: Dysphagia 3 (mechanical soft);Thin liquid Liquids provided via: Cup;Straw Medication Administration: Crushed with puree Compensations: Minimize environmental distractions;Slow rate;Small sips/bites;Follow solids with liquid Postural Changes and/or Swallow Maneuvers: Seated upright 90 degrees                Oral Care Recommendations: Oral care BID Follow Up Recommendations: No SLP follow up Assistance recommended at discharge: Frequent or constant Supervision/Assistance SLP Visit Diagnosis: Dysphagia, unspecified (R13.10) Plan: All goals met;Discharge SLP treatment due to (comment)           Tanya Koch, [place], [place] Office: [phone] 09/15/2021, 11:56 AM

## 2021-09-15 NOTE — Progress Notes (Signed)
Patient on monitors noted to be in SVT/possible A-fib started metoprolol 12.5 twice daily-if persistent tomorrow will need to increase dose and consider anticoagulation and I will discuss this with family

## 2021-09-15 NOTE — Progress Notes (Signed)
Paged Doc about Heart monitor reading Afib HR 120's, EKG completed no new orders at time

## 2021-09-16 DIAGNOSIS — S72002A Fracture of unspecified part of neck of left femur, initial encounter for closed fracture: Secondary | ICD-10-CM | POA: Diagnosis not present

## 2021-09-16 LAB — CBC
HCT: 26.8 % — ABNORMAL LOW (ref 36.0–46.0)
Hemoglobin: 8.7 g/dL — ABNORMAL LOW (ref 12.0–15.0)
MCH: 30.9 pg (ref 26.0–34.0)
MCHC: 32.5 g/dL (ref 30.0–36.0)
MCV: 95 fL (ref 80.0–100.0)
Platelets: 235 10*3/uL (ref 150–400)
RBC: 2.82 MIL/uL — ABNORMAL LOW (ref 3.87–5.11)
RDW: 16.6 % — ABNORMAL HIGH (ref 11.5–15.5)
WBC: 8.8 10*3/uL (ref 4.0–10.5)
nRBC: 0 % (ref 0.0–0.2)

## 2021-09-16 LAB — BASIC METABOLIC PANEL
Anion gap: 6 (ref 5–15)
BUN: 57 mg/dL — ABNORMAL HIGH (ref 8–23)
CO2: 23 mmol/L (ref 22–32)
Calcium: 8.2 mg/dL — ABNORMAL LOW (ref 8.9–10.3)
Chloride: 107 mmol/L (ref 98–111)
Creatinine, Ser: 2.28 mg/dL — ABNORMAL HIGH (ref 0.44–1.00)
GFR, Estimated: 20 mL/min — ABNORMAL LOW (ref >60)
Glucose, Bld: 94 mg/dL (ref 70–99)
Potassium: 4.2 mmol/L (ref 3.5–5.1)
Sodium: 136 mmol/L (ref 135–145)

## 2021-09-16 LAB — MAGNESIUM: Magnesium: 2.3 mg/dL (ref 1.7–2.4)

## 2021-09-16 MED ORDER — PANTOPRAZOLE 2 MG/ML SUSPENSION
40.0000 mg | Freq: Every day | ORAL | Status: DC
  Administered 2021-09-16 – 2021-09-18 (×3): 40 mg via ORAL
  Filled 2021-09-16 (×4): qty 20

## 2021-09-16 NOTE — Progress Notes (Signed)
Protonix 40mg  EC, changed to 40mg  liquid per MD

## 2021-09-16 NOTE — Progress Notes (Signed)
PROGRESS NOTE   Tanya Koch  AOZ:[id number] DOB: Dec 21, 1928 DOA: 09/08/2021 PCP: Earmon Phoenix, NP  Brief Narrative:   88 year old white female Known history of CAD HFrEF prior COVID prior hematemesis Right closed hip fracture 12/22 Prior history of Mallory-Weiss tear 11/2020 Previous asymptomatic COVID infection 11/2020 Admitted with left hip fracture-transfused this admission 1 unit  Hospital-Problem based course  Closed left hip fracture status post left hip Hemiarthroplasty 7/14 WBAT LLE as tolerated Lovenox SCD for prophylaxis Needs Lovenox X 2 weeks Pain control with opiates although try to avoid as best possible Paroxysmal SVT?  Also history sinus bradycardia in the past Developed SVT 7/20-started on metoprolol and converted to sinus, sinus bradycardia on 7/21 Monitor on telemetry today but can discontinue if stabilizes Reactive leukocytosis Resolved on follow-up labs-no further work-up Anemia of acute blood loss earlier in admission Transfused 1 unit 7/14, currently hemoglobin acceptable currently in the 8 range HTN Heart rate is better controlled on metoprolol 12.5 twice daily continue this monitor trends Moderate dementia Pleasantly confused-continue Xanax 0.25 twice daily, Lexapro 10 Bilateral carotid artery disease-only work-up as needed Emphysema lung-stable at this time CKD 4-plan Lasix this is been discontinued  DVT prophylaxis: None present at the bedside Code Status: DNR Family Communication: Discussed with patient's son [name] ([phone]) on 09/16/2021-awaiting QuantiFERON gold to be able to discharge Disposition:  Status is: Inpatient Remains inpatient appropriate because:   Awaiting further testing   Consultants:  None  Procedures: Hip repair  Antimicrobials: None   Subjective: Pleasantly confused at this time Arrhythmia seems to have resolved she is now slightly bradycardic  Objective: Vitals:   09/15/21 1941 09/15/21 2327 09/16/21  0336 09/16/21 0700  BP: (!) 148/62 (!) 144/52 (!) 145/58   Pulse: 81  (!) 51   Resp: (!) 21     Temp: 98.3 F (36.8 C) 98.5 F (36.9 C) 98.4 F (36.9 C) 98.4 F (36.9 C)  TempSrc: Oral Oral Oral Oral  SpO2: 97%  95%   Weight:      Height:        Intake/Output Summary (Last 24 hours) at 09/16/2021 1003 Last data filed at 09/16/2021 0100 Gross per 24 hour  Intake 100 ml  Output --  Net 100 ml   Filed Weights   09/09/21 2244  Weight: 68.8 kg    Examination:  EOMI NCAT no focal deficit CTA B no added sound ROM is intact Abdomen is soft S1-S2 no murmur-monitor shows sinus bradycardia into the 50s  Data Reviewed: personally reviewed   CBC    Component Value Date/Time   WBC 8.8 09/16/2021 0459   RBC 2.82 (L) 09/16/2021 0459   HGB 8.7 (L) 09/16/2021 0459   HCT 26.8 (L) 09/16/2021 0459   PLT 235 09/16/2021 0459   MCV 95.0 09/16/2021 0459   MCH 30.9 09/16/2021 0459   MCHC 32.5 09/16/2021 0459   RDW 16.6 (H) 09/16/2021 0459   LYMPHSABS 0.8 09/08/2021 1106   MONOABS 1.7 (H) 09/08/2021 1106   EOSABS 0.1 09/08/2021 1106   BASOSABS 0.0 09/08/2021 1106      Latest Ref Rng & Units 09/16/2021    4:59 AM 09/15/2021    3:41 AM 09/14/2021    1:58 AM  CMP  Glucose 70 - 99 mg/dL 94  100  104   BUN 8 - 23 mg/dL 57  51  55   Creatinine 0.44 - 1.00 mg/dL 2.28  2.32  2.85   Sodium 135 - 145 mmol/L 136  140  140   Potassium 3.5 - 5.1 mmol/L 4.2  4.7  5.0   Chloride 98 - 111 mmol/L 107  107  107   CO2 22 - 32 mmol/L 23  25  24    Calcium 8.9 - 10.3 mg/dL 8.2  8.4  8.2      Radiology Studies: No results found.   Scheduled Meds:  ALPRAZolam  0.25 mg Oral BID   calcium carbonate  1,000 mg Oral Daily   docusate sodium  100 mg Oral BID   enoxaparin (LOVENOX) injection  30 mg Subcutaneous Q24H   escitalopram  10 mg Oral Daily   feeding supplement  237 mL Oral BID BM   ferrous sulfate  325 mg Oral Q breakfast   folic acid  1 mg Oral QODAY   magnesium oxide  400 mg Oral BID    metoprolol tartrate  12.5 mg Oral BID   multivitamin with minerals  1 tablet Oral Daily   pantoprazole  40 mg Oral Daily   rosuvastatin  10 mg Oral QHS   tranexamic acid (CYKLOKAPRON) 2,000 mg in sodium chloride 0.9 % 50 mL Topical Application  7,680 mg Topical Once   Continuous Infusions:  methocarbamol (ROBAXIN) IV       LOS: 8 days   Time spent: [place], MD [facility] To contact the attending provider between 7A-7P or the covering provider during after hours 7P-7A, please log into the web site [email or web address] and access using universal [place] password for that web site. If you do not have the password, please call the hospital operator.  09/16/2021, 10:03 AM

## 2021-09-16 NOTE — Plan of Care (Signed)
  Problem: Clinical Measurements: Goal: Ability to maintain clinical measurements within normal limits will improve Outcome: Progressing   

## 2021-09-16 NOTE — TOC Progression Note (Signed)
Transition of Care [facility]) - Progression Note    Patient Details  Name: Tanya Koch MRN: [id number] Date of Birth: 24-Jun-1928  Transition of [facility]) CM/SW Contact  Joanne Chars, LCSW Phone Number: 09/16/2021, 3:54 PM  Clinical Narrative:   Laurance Flatten test still pending.  CSW received call from Louie Casa at [facility] needing med changes on the FL2. .  Tammy reports cannot have the injections that are currently on the FL2.  Also meds need to have specific amounts--cannot say 1-2 pills, must say 1 or 2.  MD informed.  Please email the new FL2 to Tammy at [email or web address]  If [facility] comes back, they could take her over the weekend.  [facility] is the contact at [place] for a weekend admission and is aware.    Expected Discharge Plan: Skilled Nursing Facility Barriers to Discharge: SNF Pending bed offer  Expected Discharge Plan and Services Expected Discharge Plan: [place] In-house Referral: Clinical Social Work   Post Acute Care Choice: [place] Living arrangements for the past 2 months: [facility]) Expected Discharge Date: 09/13/21                                     Social Determinants of Health (SDOH) Interventions    Readmission Risk Interventions     No data to display

## 2021-09-16 NOTE — Progress Notes (Signed)
Physical Therapy Treatment Patient Details Name: Tanya Koch MRN: [id number] DOB: 09/26/1928 Today's Date: 09/16/2021   History of Present Illness Pt is a 88 y.o. female with PMH of bradycardia, history of MI, history of unspecified CHF with recovered EF, COVID-19, emphysema, history of hematemesis, hypertension, dementia, history of closed right hip fracture in December 2022 who had an unwitnessed fall and was found on the floor at her SNF facility this morning with inability to stand up and bear weight on her LLE. Pt s/p left hip hemiarthroplasty performed on 07/14.    PT Comments    Patient continues to be limited by pain, weakness, and baseline cognitive deficits. Patient requires maxA+2 for bed mobility and sit to stand transfer with use of Stedy but unable to complete full stand unless extremely elevated surface. Difficulty following commands due to cognition and HOH.  Continue to recommend SNF for ongoing Physical Therapy.      Recommendations for follow up therapy are one component of a multi-disciplinary discharge planning process, led by the attending physician.  Recommendations may be updated based on patient status, additional functional criteria and insurance authorization.  Follow Up Recommendations  Skilled nursing-short term rehab (<3 hours/day) Can patient physically be transported by private vehicle: No   Assistance Recommended at Discharge Frequent or constant Supervision/Assistance  Patient can return home with the following A lot of help with walking and/or transfers;A lot of help with bathing/dressing/bathroom;Assistance with cooking/housework;Assistance with feeding;Assist for transportation;Direct supervision/assist for financial management;Direct supervision/assist for medications management   Equipment Recommendations  None recommended by PT    Recommendations for Other Services       Precautions / Restrictions Precautions Precautions: Fall Precaution  Comments: no hip precautions Restrictions Weight Bearing Restrictions: Yes LLE Weight Bearing: Weight bearing as tolerated     Mobility  Bed Mobility Overal bed mobility: Needs Assistance Bed Mobility: Supine to Sit     Supine to sit: Max assist, +2 for physical assistance, +2 for safety/equipment     General bed mobility comments: maxA+2 to come towards EOB for LE management and trunk elevation    Transfers Overall transfer level: Needs assistance Equipment used: Ambulation equipment used Transfers: Sit to/from Stand Sit to Stand: Max assist, +2 physical assistance, +2 safety/equipment           General transfer comment: maxA+2 to power up into standing x 2 with patient requiring max cues for upright posture. transferred to chair via Charlaine Dalton Transfer via Geophysicist/field seismologist: Stedy  Ambulation/Gait                   Stairs             Wheelchair Mobility    Modified Rankin (Stroke Patients Only)       Balance Overall balance assessment: Needs assistance Sitting-balance support: Bilateral upper extremity supported, Feet supported Sitting balance-Leahy Scale: Fair     Standing balance support: Bilateral upper extremity supported Standing balance-Leahy Scale: Zero                              Cognition Arousal/Alertness: Awake/alert Behavior During Therapy: Flat affect Overall Cognitive Status: History of cognitive impairments - at baseline                                 General Comments: hx of dementia, pleasantly confused. requires multimodal cues to follow directions though  also impacted by [facility].        Exercises      General Comments General comments (skin integrity, edema, etc.): HR as low as 40 and as high as 70      Pertinent Vitals/Pain Pain Assessment Pain Assessment: Faces Faces Pain Scale: Hurts even more Pain Location: L hip Pain Descriptors / Indicators: Grimacing, Guarding Pain Intervention(s):  Monitored during session, Repositioned    Home Living                          Prior Function            PT Goals (current goals can now be found in the care plan section) Acute Rehab PT Goals Patient Stated Goal: unable to state PT Goal Formulation: With patient Time For Goal Achievement: 09/24/21 Potential to Achieve Goals: Fair Progress towards PT goals: Progressing toward goals    Frequency    Min 3X/week      PT Plan Current plan remains appropriate    Co-evaluation              AM-PAC PT "6 Clicks" Mobility   Outcome Measure  Help needed turning from your back to your side while in a flat bed without using bedrails?: A Lot Help needed moving from lying on your back to sitting on the side of a flat bed without using bedrails?: Total Help needed moving to and from a bed to a chair (including a wheelchair)?: Total Help needed standing up from a chair using your arms (e.g., wheelchair or bedside chair)?: Total Help needed to walk in hospital room?: Total Help needed climbing 3-5 steps with a railing? : Total 6 Click Score: 7    End of Session Equipment Utilized During Treatment: Gait belt Activity Tolerance: Patient tolerated treatment well Patient left: in chair;with call bell/phone within reach;with chair alarm set;Other (comment) (lift pad underneath patient) Nurse Communication: Mobility status PT Visit Diagnosis: Other abnormalities of gait and mobility (R26.89);Repeated falls (R29.6);Muscle weakness (generalized) (M62.81);Pain Pain - Right/Left: Left Pain - part of body: Hip     Time: 2297-9892 PT Time Calculation (min) (ACUTE ONLY): 18 min  Charges:  $Therapeutic Activity: 8-22 mins                     Ygnacio Fecteau A. Gilford Rile PT, DPT Acute Rehabilitation Services Office [phone]    Tanya Koch 09/16/2021, 4:38 PM

## 2021-09-16 NOTE — TOC Progression Note (Signed)
Transition of Care [facility]) - Progression Note    Patient Details  Name: Neda Willenbring MRN: [id number] Date of Birth: 11/23/1928  Transition of Care [facility]) CM/SW Contact  Joanne Chars, LCSW Phone Number: 09/16/2021, 9:56 AM  Clinical Narrative:   TB/quantiferon test still pending for admit to [facility].  FL2, diet order form emailed to [place] at [employer].     Expected Discharge Plan: Skilled Nursing Facility Barriers to Discharge: SNF Pending bed offer  Expected Discharge Plan and Services Expected Discharge Plan: [place] In-house Referral: Clinical Social Work   Post Acute Care Choice: [place] Living arrangements for the past 2 months: [facility]) Expected Discharge Date: 09/13/21                                     Social Determinants of Health (SDOH) Interventions    Readmission Risk Interventions     No data to display

## 2021-09-17 DIAGNOSIS — S72002A Fracture of unspecified part of neck of left femur, initial encounter for closed fracture: Secondary | ICD-10-CM | POA: Diagnosis not present

## 2021-09-17 LAB — BASIC METABOLIC PANEL
Anion gap: 8 (ref 5–15)
BUN: 60 mg/dL — ABNORMAL HIGH (ref 8–23)
CO2: 24 mmol/L (ref 22–32)
Calcium: 8.2 mg/dL — ABNORMAL LOW (ref 8.9–10.3)
Chloride: 104 mmol/L (ref 98–111)
Creatinine, Ser: 2.17 mg/dL — ABNORMAL HIGH (ref 0.44–1.00)
GFR, Estimated: 21 mL/min — ABNORMAL LOW (ref >60)
Glucose, Bld: 100 mg/dL — ABNORMAL HIGH (ref 70–99)
Potassium: 4.7 mmol/L (ref 3.5–5.1)
Sodium: 136 mmol/L (ref 135–145)

## 2021-09-17 LAB — CBC
HCT: 29.9 % — ABNORMAL LOW (ref 36.0–46.0)
Hemoglobin: 9.7 g/dL — ABNORMAL LOW (ref 12.0–15.0)
MCH: 31.5 pg (ref 26.0–34.0)
MCHC: 32.4 g/dL (ref 30.0–36.0)
MCV: 97.1 fL (ref 80.0–100.0)
Platelets: 246 10*3/uL (ref 150–400)
RBC: 3.08 MIL/uL — ABNORMAL LOW (ref 3.87–5.11)
RDW: 16.4 % — ABNORMAL HIGH (ref 11.5–15.5)
WBC: 12.9 10*3/uL — ABNORMAL HIGH (ref 4.0–10.5)
nRBC: 0 % (ref 0.0–0.2)

## 2021-09-17 LAB — QUANTIFERON-TB GOLD PLUS (RQFGPL)
QuantiFERON Mitogen Value: 0.46 IU/mL
QuantiFERON Nil Value: 0.02 IU/mL
QuantiFERON TB1 Ag Value: 0.03 IU/mL
QuantiFERON TB2 Ag Value: 0.02 IU/mL

## 2021-09-17 LAB — QUANTIFERON-TB GOLD PLUS: QuantiFERON-TB Gold Plus: UNDETERMINED — AB

## 2021-09-17 LAB — MAGNESIUM: Magnesium: 2.2 mg/dL (ref 1.7–2.4)

## 2021-09-17 NOTE — Plan of Care (Signed)
  Problem: Clinical Measurements: Goal: Ability to maintain clinical measurements within normal limits will improve Outcome: Progressing Goal: Will remain free from infection Outcome: Progressing Goal: Diagnostic test results will improve Outcome: Progressing Goal: Cardiovascular complication will be avoided Outcome: Progressing   Problem: Elimination: Goal: Will not experience complications related to bowel motility Outcome: Progressing Goal: Will not experience complications related to urinary retention Outcome: Progressing   Problem: Pain Managment: Goal: General experience of comfort will improve Outcome: Progressing   Problem: Safety: Goal: Ability to remain free from injury will improve Outcome: Progressing   Problem: Skin Integrity: Goal: Risk for impaired skin integrity will decrease Outcome: Progressing

## 2021-09-17 NOTE — Progress Notes (Signed)
PROGRESS NOTE   Tanya Koch  YYT:[id number] DOB: 07-Aug-1928 DOA: 09/08/2021 PCP: Earmon Phoenix, NP  Brief Narrative:   88 year old white female Known history of CAD HFrEF prior COVID prior hematemesis Right closed hip fracture 12/22 Prior history of Mallory-Weiss tear 11/2020 Previous asymptomatic COVID infection 11/2020 Admitted with left hip fracture-transfused this admission 1 unit  Hospital-Problem based course  Closed left hip fracture status post left hip Hemiarthroplasty 7/14 WBAT LLE as tolerated Lovenox SCD for prophylaxis Needs Lovenox X 2 weeks ending on 7/28 Pain control with opiates although try to avoid as best possible Paroxysmal SVT?  Also history sinus bradycardia in the past Developed SVT 7/20-started on metoprolol and converted to sinus, sinus bradycardia on 7/21 Discontinue monitors today expect will have some mild arrhythmias  Reactive leukocytosis Resolved on follow-up labs-no further work-up Anemia of acute blood loss earlier in admission Transfused 1 unit 7/14, currently hemoglobin acceptable currently in the 8 range HTN Heart rate is better controlled on metoprolol 12.5 twice daily continue this monitor trends Intermittent leukocytosis Etiology unclear no real fever Moderate dementia Pleasantly confused-continue Xanax 0.25 twice daily, Lexapro 10 Bilateral carotid artery disease-only work-up as needed Emphysema lung-stable at this time CKD 4-plan Lasix this is been discontinued--she is not a great dialysis candidate-monitor trends  DVT prophylaxis: None present at the bedside Code Status: DNR Family Communication: Discussed with patient's son Tanya Koch ([phone]) on 09/16/2021-awaiting [employer] gold to be able to discharge Disposition:  Status is: Inpatient Remains inpatient appropriate because:   Awaiting further testing   Consultants:  None  Procedures: Hip repair  Antimicrobials: None   Subjective:  Remains pleasantly confused  pain seems moderately controlled No chest pain no fever  Objective: Vitals:   09/16/21 1935 09/16/21 2215 09/17/21 0432 09/17/21 0801  BP: (!) 152/45 (!) 160/61 (!) 175/55 (!) 175/70  Pulse: (!) 52 61 (!) 55 66  Resp: 17  16 15   Temp: 98.4 F (36.9 C)  98.6 F (37 C) 98.5 F (36.9 C)  TempSrc: Oral  Oral Oral  SpO2: 95%  95% 94%  Weight:      Height:       No intake or output data in the 24 hours ending 09/17/21 0927  Filed Weights   09/09/21 2244  Weight: 68.8 kg    Examination:  EOMI NCAT no focal deficit CTA B no added sound S1-S2 no murmur sinus normal sinus bradycardia on monitors ROM is intact Abdomen is soft   Data Reviewed: personally reviewed   CBC    Component Value Date/Time   WBC 12.9 (H) 09/17/2021 0330   RBC 3.08 (L) 09/17/2021 0330   HGB 9.7 (L) 09/17/2021 0330   HCT 29.9 (L) 09/17/2021 0330   PLT 246 09/17/2021 0330   MCV 97.1 09/17/2021 0330   MCH 31.5 09/17/2021 0330   MCHC 32.4 09/17/2021 0330   RDW 16.4 (H) 09/17/2021 0330   LYMPHSABS 0.8 09/08/2021 1106   MONOABS 1.7 (H) 09/08/2021 1106   EOSABS 0.1 09/08/2021 1106   BASOSABS 0.0 09/08/2021 1106      Latest Ref Rng & Units 09/17/2021    3:30 AM 09/16/2021    4:59 AM 09/15/2021    3:41 AM  CMP  Glucose 70 - 99 mg/dL 100  94  100   BUN 8 - 23 mg/dL 60  57  51   Creatinine 0.44 - 1.00 mg/dL 2.17  2.28  2.32   Sodium 135 - 145 mmol/L 136  136  140  Potassium 3.5 - 5.1 mmol/L 4.7  4.2  4.7   Chloride 98 - 111 mmol/L 104  107  107   CO2 22 - 32 mmol/L 24  23  25    Calcium 8.9 - 10.3 mg/dL 8.2  8.2  8.4      Radiology Studies: No results found.   Scheduled Meds:  ALPRAZolam  0.25 mg Oral BID   calcium carbonate  1,000 mg Oral Daily   docusate sodium  100 mg Oral BID   enoxaparin (LOVENOX) injection  30 mg Subcutaneous Q24H   escitalopram  10 mg Oral Daily   feeding supplement  237 mL Oral BID BM   ferrous sulfate  325 mg Oral Q breakfast   folic acid  1 mg Oral QODAY    magnesium oxide  400 mg Oral BID   metoprolol tartrate  12.5 mg Oral BID   multivitamin with minerals  1 tablet Oral Daily   pantoprazole sodium  40 mg Oral Daily   rosuvastatin  10 mg Oral QHS   tranexamic acid (CYKLOKAPRON) 2,000 mg in sodium chloride 0.9 % 50 mL Topical Application  3,291 mg Topical Once   Continuous Infusions:  methocarbamol (ROBAXIN) IV       LOS: 9 days   Time spent: 73  Nita Sells, MD [facility] To contact the attending provider between 7A-7P or the covering provider during after hours 7P-7A, please log into the web site [email or web address] and access using universal [place] password for that web site. If you do not have the password, please call the hospital operator.  09/17/2021, 9:27 AM

## 2021-09-18 DIAGNOSIS — S72002A Fracture of unspecified part of neck of left femur, initial encounter for closed fracture: Secondary | ICD-10-CM | POA: Diagnosis not present

## 2021-09-18 LAB — CBC WITH DIFFERENTIAL/PLATELET
Abs Immature Granulocytes: 0.1 10*3/uL — ABNORMAL HIGH (ref 0.00–0.07)
Basophils Absolute: 0 10*3/uL (ref 0.0–0.1)
Basophils Relative: 0 %
Eosinophils Absolute: 0.4 10*3/uL (ref 0.0–0.5)
Eosinophils Relative: 3 %
HCT: 28.9 % — ABNORMAL LOW (ref 36.0–46.0)
Hemoglobin: 9.3 g/dL — ABNORMAL LOW (ref 12.0–15.0)
Immature Granulocytes: 1 %
Lymphocytes Relative: 8 %
Lymphs Abs: 0.9 10*3/uL (ref 0.7–4.0)
MCH: 31.2 pg (ref 26.0–34.0)
MCHC: 32.2 g/dL (ref 30.0–36.0)
MCV: 97 fL (ref 80.0–100.0)
Monocytes Absolute: 1.4 10*3/uL — ABNORMAL HIGH (ref 0.1–1.0)
Monocytes Relative: 13 %
Neutro Abs: 8.3 10*3/uL — ABNORMAL HIGH (ref 1.7–7.7)
Neutrophils Relative %: 75 %
Platelets: 243 10*3/uL (ref 150–400)
RBC: 2.98 MIL/uL — ABNORMAL LOW (ref 3.87–5.11)
RDW: 16.5 % — ABNORMAL HIGH (ref 11.5–15.5)
WBC: 11.1 10*3/uL — ABNORMAL HIGH (ref 4.0–10.5)
nRBC: 0 % (ref 0.0–0.2)

## 2021-09-18 LAB — BASIC METABOLIC PANEL
Anion gap: 6 (ref 5–15)
BUN: 53 mg/dL — ABNORMAL HIGH (ref 8–23)
CO2: 25 mmol/L (ref 22–32)
Calcium: 8.4 mg/dL — ABNORMAL LOW (ref 8.9–10.3)
Chloride: 107 mmol/L (ref 98–111)
Creatinine, Ser: 2.19 mg/dL — ABNORMAL HIGH (ref 0.44–1.00)
GFR, Estimated: 21 mL/min — ABNORMAL LOW (ref >60)
Glucose, Bld: 124 mg/dL — ABNORMAL HIGH (ref 70–99)
Potassium: 4.6 mmol/L (ref 3.5–5.1)
Sodium: 138 mmol/L (ref 135–145)

## 2021-09-18 LAB — GLUCOSE, CAPILLARY: Glucose-Capillary: 126 mg/dL — ABNORMAL HIGH (ref 70–99)

## 2021-09-18 MED ORDER — ONDANSETRON HCL 4 MG PO TABS: 4.0000 mg | ORAL_TABLET | Freq: Three times a day (TID) | ORAL | 0 refills | Status: AC | PRN

## 2021-09-18 MED ORDER — ALPRAZOLAM 0.25 MG PO TABS: 0.2500 mg | ORAL_TABLET | Freq: Two times a day (BID) | ORAL | 0 refills | Status: DC

## 2021-09-18 MED ORDER — ASPIRIN 81 MG PO CHEW
81.0000 mg | CHEWABLE_TABLET | Freq: Two times a day (BID) | ORAL | 0 refills | Status: AC
  Filled 2021-09-18: qty 10, 5d supply, fill #0

## 2021-09-18 MED ORDER — HYDROCODONE-ACETAMINOPHEN 5-325 MG PO TABS: 1.0000 | ORAL_TABLET | Freq: Four times a day (QID) | ORAL | 0 refills | Status: AC | PRN

## 2021-09-18 MED ORDER — ALPRAZOLAM 0.25 MG PO TABS: 0.2500 mg | ORAL_TABLET | Freq: Two times a day (BID) | ORAL | 0 refills | Status: AC

## 2021-09-18 MED ORDER — ESCITALOPRAM OXALATE 10 MG PO TABS: 10.0000 mg | ORAL_TABLET | Freq: Every day | ORAL | 0 refills | Status: AC

## 2021-09-18 MED ORDER — METOPROLOL TARTRATE 25 MG PO TABS: 12.5000 mg | ORAL_TABLET | Freq: Two times a day (BID) | ORAL | Status: AC

## 2021-09-18 NOTE — TOC Progression Note (Addendum)
Transition of Care [facility]) - Progression Note    Patient Details  Name: Tanya Koch MRN: [id number] Date of Birth: Jul 12, 1928  Transition of Care [facility]) CM/SW Contact  Ina Homes, [place] Phone Number: 09/18/2021, 8:43 AM  Clinical Narrative:     Late Entry 7/22 at 430pm SW spoke with Ivin Booty [facility] [phone]) requested TB results faxed and inquired about medication changes that were requested. SW explained was only notified TB results were the last thing needed for admission. Ivin Booty reports Jesse Sans will call SW in the morning.   Update 7/23 at 900am  SW attempted to call [place] x2, no answer  Update 1000am SW spoke with Royden Purl [facility]) requested FL2, d/c summary faxed to [phone]. Royden Purl also reports they do not have a RN to administer Lovenox injection. Pt cannot be on any IV/IM medications.  SW updated MD via secure chat  SW faxed d/c summary and FL2 to [facility] for review.  Update 1116am SW and Royden Purl reviewed pt's current d/c summary together.   Need hard Rx for xanax and schedule needs to be accurate. Current states: "Take 1 tablet (0.25 mg total) by mouth 2 (two) times daily for 4 doses." But this would actually equal 2 doses daily. There also needs to be separate Rx for 1 PRN xanax between scheduled doses for anxiety.  Need hard Rx for Zofran Lovenox needs to be removed from med rec or show plan to d/c it. Aspirin dose needs to be added to med rec Pt will not have access to pain medications today as facility pharmacy is closed. They do have some of her medications from [facility].   Update 1145am  SW spoke with pt's son Gershon Mussel and DIL Marcie Bal at bedside, discussed current barriers and inquired about pain management for pt. Bedside RN joined as well. Tom/Janet report pt has not complained of pain to them recently. Bedside RN reviewed meds, reports pt not requiring frequent pain meds. Tom/Janet agreeable to still transfer pt today if  [facility] able to accept.  Update 1200pm SW spoke with Royden Purl again, reviewed changes, confirmed able to accept today.   SW updated MD, RN and son Lorre Nick called  Expected Discharge Plan: Skilled Nursing Facility Barriers to Discharge: SNF Pending bed offer  Expected Discharge Plan and Services Expected Discharge Plan: [place] In-house Referral: Clinical Social Work   Post Acute Care Choice: [place] Living arrangements for the past 2 months: [facility]) Expected Discharge Date: 09/13/21                                     Social Determinants of Health (SDOH) Interventions    Readmission Risk Interventions     No data to display

## 2021-09-18 NOTE — Progress Notes (Signed)
PTAR at bedside to pick up patient to transfer to [facility].  Patient's son Kirsty Monjaraz called and notified of same.   PIV removed.   No patient belongings at bedside. Patient currently wearing her dentures.

## 2021-09-18 NOTE — TOC Transition Note (Signed)
Transition of Care [facility]) - CM/SW Discharge Note   Patient Details  Name: Tanya Koch MRN: [id number] Date of Birth: 19-May-1928  Transition of Care [facility]) CM/SW Contact:  Ina Homes, [place] Phone Number: 09/18/2021, 1:50 PM   Clinical Narrative:     Pt d/c to [facility] via [facility]  Final next level of care: Assisted Living Barriers to Discharge: Barriers Resolved   Patient Goals and CMS Choice   CMS Medicare.gov Compare Post Acute Care list provided to:: Patient Represenative (must comment) Choice offered to / list presented to : Adult Children (daughter in law, Marcie Bal)  Discharge Placement              Patient chooses bed at:  [facility]) Patient to be transferred to facility by: [facility] Name of family member notified: Tom Patient and family notified of of transfer: 09/18/21  Discharge Plan and Services In-house Referral: Clinical Social Work   Post Acute Care Choice: [place]                               Social Determinants of Health (SDOH) Interventions     Readmission Risk Interventions     No data to display

## 2021-09-18 NOTE — Discharge Summary (Addendum)
Physician Discharge Summary  Tanya Koch HEN:[id number] DOB: December 24, 1928 DOA: 09/08/2021  PCP: Earmon Phoenix, NP  Admit date: 09/08/2021 Discharge date: 09/18/2021  Time spent: 27 minutes  Recommendations for Outpatient Follow-up:  Needs Chem-12 CBC in about 1 week at facility Monitor heart rate had some SVT during hospitalization which has resolved and is on metoprolol may need to lower dose in the outpatient setting Needs routine follow-up with orthopedics Dr. Erlinda Hong in the outpatient setting-Lovenox at facility this was changed to aspirin 81 twice daily until 7/28 Lovenox transition to aspirin twice daily   DIscharge Diagnoses:  MAIN problem for hospitalization   Left hip fracture status post left hemiarthroplasty Prior Mallory-Weiss tear SVT this admission Acute blood loss anemia this admission  Please see below for itemized issues addressed in [place]- refer to other progress notes for clarity if needed  Discharge Condition: Improved heart healthy  Diet recommendation: Heart healthy  Filed Weights   09/09/21 2244  Weight: 68.8 kg    History of present illness:  88 year old white female Known history of CAD HFrEF prior COVID prior hematemesis Right closed hip fracture 12/22 Prior history of Mallory-Weiss tear 11/2020 Previous asymptomatic COVID infection 11/2020 Admitted with left hip fracture-transfused this admission 1 unit  Hospital Course:    Closed left hip fracture status post left hip Hemiarthroplasty 7/14 WBAT LLE as tolerated Lovenox was transitioned to aspirin twice daily and on 7/28 Pain control with opiates although try to avoid as best possible Paroxysmal SVT?  Also history sinus bradycardia in the past Developed SVT 7/20-started on metoprolol and converted to sinus, sinus bradycardia on 7/21 Discontinue monitors today expect will have some mild arrhythmias  Reactive leukocytosis Resolved on follow-up labs-no further work-up Anemia of acute blood loss  earlier in admission Transfused 1 unit 7/14, currently hemoglobin acceptable currently in the 8 range HTN Heart rate is better controlled on metoprolol 12.5 twice daily continue this monitor trends Intermittent leukocytosis Etiology unclear no real fever Moderate dementia Pleasantly confused-continue Xanax 0.25 twice daily, Lexapro 10 Bilateral carotid artery disease-only work-up as needed Emphysema lung-stable at this time CKD 4-plan Lasix this is been discontinued--she is not a great dialysis candidate-monitor trends  Procedures: Hip repair  7/14  Consultations: Orthopedics  Discharge Exam: Vitals:   09/17/21 2324 09/18/21 0728  BP: (!) 132/51 (!) 161/56  Pulse: 67   Resp: 13 15  Temp:  98.2 F (36.8 C)  SpO2:  97%    Subj on day of d/c   Awake coherent very hard of hearing no distress able to straight leg raise on the left side with mild pain  General Exam on discharge  EOMI NCAT no focal deficit no wheeze rales rhonchi chest clear no added sound Sinus on monitors S1-S2 abdomen soft  Discharge Instructions   Discharge Instructions     Diet - low sodium heart healthy   Complete by: As directed    Discharge instructions   Complete by: As directed    Check CBC and BMP in 1 week Follow-up with orthopedic surgery 2 weeks from day of discharge  Please call call MD or return to ER for similar or worsening recurring problem that brought you to hospital or if any fever,nausea/vomiting,abdominal pain, uncontrolled pain, chest pain,  shortness of breath or any other alarming symptoms.  Please follow-up your doctor as instructed in a week time and call the office for appointment.  Please avoid alcohol, smoking, or any other illicit substance and maintain healthy habits including taking your regular  medications as prescribed.  You were cared for by a hospitalist during your hospital stay. If you have any questions about your discharge medications or the care you  received while you were in the hospital after you are discharged, you can call the unit and ask to speak with the hospitalist on call if the hospitalist that took care of you is not available.  Once you are discharged, your primary care physician will handle any further medical issues. Please note that NO REFILLS for any discharge medications will be authorized once you are discharged, as it is imperative that you return to your primary care physician (or establish a relationship with a primary care physician if you do not have one) for your aftercare needs so that they can reassess your need for medications and monitor your lab values   Discharge patient   Complete by: As directed    Discharge disposition: 03-Skilled Nursing Facility   Discharge patient date: 09/18/2021   Discharge wound care:   Complete by: As directed    Reinforce dressing as needed and follow-up instruction provided   Increase activity slowly   Complete by: As directed    Weight bearing as tolerated   Complete by: As directed       Allergies as of 09/18/2021   No Known Allergies      Medication List     STOP taking these medications    amLODipine 10 MG tablet Commonly known as: NORVASC   furosemide 20 MG tablet Commonly known as: LASIX       TAKE these medications    acetaminophen 325 MG tablet Commonly known as: TYLENOL Take 650 mg by mouth every 6 (six) hours as needed for moderate pain.   ALPRAZolam 0.25 MG tablet Commonly known as: XANAX Take 1 tablet (0.25 mg total) by mouth 2 (two) times daily for 4 doses. What changed: additional instructions   aspirin 81 MG chewable tablet Commonly known as: Aspirin Childrens Chew 1 tablet (81 mg total) by mouth in the morning and at bedtime for 5 days.   calcium carbonate 500 MG chewable tablet Commonly known as: TUMS - dosed in mg elemental calcium Chew 1,000 mg by mouth daily.   docusate sodium 100 MG capsule Commonly known as: COLACE Take 100 mg  by mouth at bedtime.   Ensure Take 237 mLs by mouth 2 (two) times daily between meals. What changed: Another medication with the same name was added. Make sure you understand how and when to take each.   feeding supplement Liqd Take 237 mLs by mouth 2 (two) times daily between meals. What changed: You were already taking a medication with the same name, and this prescription was added. Make sure you understand how and when to take each.   escitalopram 10 MG tablet Commonly known as: LEXAPRO Take 1 tablet (10 mg total) by mouth daily.   ferrous sulfate 325 (65 FE) MG tablet Take 325 mg by mouth daily with breakfast.   folic acid 1 MG tablet Commonly known as: FOLVITE Take 1 mg by mouth every other day.   HYDROcodone-acetaminophen 5-325 MG tablet Commonly known as: Norco Take 1 tablet by mouth every 6 (six) hours as needed.   magnesium oxide 400 (240 Mg) MG tablet Commonly known as: MAG-OX Take 400 mg by mouth 2 (two) times daily.   metoprolol tartrate 25 MG tablet Commonly known as: LOPRESSOR Take 0.5 tablets (12.5 mg total) by mouth 2 (two) times daily.   ondansetron 4 MG  tablet Commonly known as: ZOFRAN Take 1 tablet (4 mg total) by mouth every 8 (eight) hours as needed for nausea or vomiting.   pantoprazole 40 MG tablet Commonly known as: PROTONIX Take 40 mg by mouth daily.   polyethylene glycol 17 g packet Commonly known as: MIRALAX / GLYCOLAX Take 17 g by mouth daily as needed for mild constipation.   rosuvastatin 10 MG tablet Commonly known as: CRESTOR Take 10 mg by mouth at bedtime.   vitamin B-12 1000 MCG tablet Commonly known as: CYANOCOBALAMIN Take 2,000 mcg by mouth daily.   Zinc 30 MG Tabs Take 30 mg by mouth daily.               Discharge Care Instructions  (From admission, onward)           Start     Ordered   09/13/21 0000  Discharge wound care:       Comments: Reinforce dressing as needed and follow-up instruction provided    09/13/21 1055   09/09/21 0000  Weight bearing as tolerated        09/09/21 1254           No Known Allergies  Follow-up Information     Leandrew Koyanagi, MD Follow up in 2 week(s).   Specialty: Orthopedic Surgery Why: For suture removal, For wound re-check Contact information: [place] [address] [phone]         Earmon Phoenix, NP Follow up in 1 week(s).   Specialty: Gerontology Contact information: [place] [address] [phone]         Early Osmond, MD .   Specialty: Cardiology Contact information: [place] [address] [phone]                  The results of significant diagnostics from this hospitalization (including imaging, microbiology, ancillary and laboratory) are listed below for reference.    Significant Diagnostic Studies: Pelvis Portable  Result Date: 09/09/2021 CLINICAL DATA:  A 88 year old presents for follow-up following hip arthroplasty. EXAM: PORTABLE PELVIS 1-2 VIEWS COMPARISON:  CT of the pelvis of July of 2013. FINDINGS: Immediate postoperative appearance of the LEFT hip following hip arthroplasty with no unexpected radiographic abnormality. Soft tissue gas over the LEFT hip. Osteopenia without signs of complication on AP views. Incidental note is made again of fracture of the RIGHT greater trochanter which is a chronic finding. IMPRESSION: 1. Immediate postoperative appearance following LEFT hip arthroplasty without signs of complication on AP views. 2. Chronic RIGHT greater trochanter fracture. Electronically Signed   By: Zetta Bills M.D.   On: 09/09/2021 13:45   DG HIP UNILAT WITH PELVIS 1V LEFT  Result Date: 09/09/2021 CLINICAL DATA:  Fracture left hip, fluoroscopic assistance for left hip arthroplasty EXAM: DG HIP (WITH OR WITHOUT PELVIS) 1V*L* COMPARISON:  09/08/2021 FINDINGS: Fluoroscopic images show left hip arthroplasty. Fluoroscopic time was 9  seconds. Radiation dose 0.34 mGy. IMPRESSION: Fluoroscopic assistance was provided for left hip arthroplasty. Electronically Signed   By: Elmer Picker M.D.   On: 09/09/2021 12:55   DG C-Arm 1-60 Min-No Report  Result Date: 09/09/2021 Fluoroscopy was utilized by the requesting physician.  No radiographic interpretation.   VAS US CAROTID  Result Date: 09/08/2021 Carotid Arterial Duplex Study Patient Name:  Tanya Koch  Date of Exam:   09/08/2021 Medical Rec #: [id number]       Accession #:    [id number] Date of Birth:  01/17/1929       Patient Gender: F Patient Age:   29 years Exam Location:  [facility] Procedure:      VAS US CAROTID Referring Phys: DAVID ORTIZ --------------------------------------------------------------------------------  Indications:       Atherosclerosis by CT. Risk Factors:      Hypertension, hyperlipidemia. Other Factors:     Extensive calcific atherosclerosis at the bilateral carotid                    bifurcations by CT, unable to have CTA secondary to CKD.                    Atrial fibrillation. Status post fall resulting in hip                    fracture. Limitations        Today's exam was limited due to tachycardia and arrythmia. Comparison Study:  No prior study on file Performing Technologist: Sharion Dove RVS  Examination Guidelines: A complete evaluation includes B-mode imaging, spectral Doppler, color Doppler, and power Doppler as needed of all accessible portions of each vessel. Bilateral testing is considered an integral part of a complete examination. Limited examinations for reoccurring indications may be performed as noted.  Right Carotid Findings: +----------+--------+--------+--------+------------------+---------+           PSV cm/sEDV cm/sStenosisPlaque DescriptionComments  +----------+--------+--------+--------+------------------+---------+ CCA Prox  47      8               heterogenous                 +----------+--------+--------+--------+------------------+---------+ CCA Distal36      8               heterogenous                +----------+--------+--------+--------+------------------+---------+ ICA Prox  111     39              calcific          Shadowing +----------+--------+--------+--------+------------------+---------+ ICA Mid   87      28                                          +----------+--------+--------+--------+------------------+---------+ ICA Distal85      20                                          +----------+--------+--------+--------+------------------+---------+ ECA       67      5                                           +----------+--------+--------+--------+------------------+---------+ +----------+--------+-------+--------+-------------------+           PSV cm/sEDV cmsDescribeArm Pressure (mmHG) +----------+--------+-------+--------+-------------------+ [id number]                                        +----------+--------+-------+--------+-------------------+ +---------+--------+--+--------+-+ VertebralPSV cm/s26EDV cm/s4 +---------+--------+--+--------+-+  Left Carotid Findings: +----------+--------+--------+--------+------------------+---------+           PSV cm/sEDV cm/sStenosisPlaque DescriptionComments  +----------+--------+--------+--------+------------------+---------+  CCA Prox  65      7               heterogenous                +----------+--------+--------+--------+------------------+---------+ CCA Distal61      13              heterogenous                +----------+--------+--------+--------+------------------+---------+ ICA Prox  77      20              calcific          Shadowing +----------+--------+--------+--------+------------------+---------+ ICA Mid   109     26                                          +----------+--------+--------+--------+------------------+---------+ ICA  Distal55      13                                          +----------+--------+--------+--------+------------------+---------+ ECA       36      2                                           +----------+--------+--------+--------+------------------+---------+ +----------+--------+--------+--------+-------------------+           PSV cm/sEDV cm/sDescribeArm Pressure (mmHG) +----------+--------+--------+--------+-------------------+ Subclavian80                                          +----------+--------+--------+--------+-------------------+ +---------+--------+--+--------+--+ VertebralPSV cm/s54EDV cm/s12 +---------+--------+--+--------+--+   Summary: Right Carotid: Velocities in the right ICA are consistent with a 1-39% stenosis. Left Carotid: Velocities in the left ICA are consistent with a 1-39% stenosis. Vertebrals:  Bilateral vertebral arteries demonstrate antegrade flow. Subclavians: Normal flow hemodynamics were seen in bilateral subclavian              arteries. *See table(s) above for measurements and observations.  Electronically signed by Orlie Pollen on 09/08/2021 at 4:48:43 PM.    Final    DG Chest 2 View  Result Date: 09/08/2021 CLINICAL DATA:  Fall, dementia, left hip fracture EXAM: CHEST - 2 VIEW COMPARISON:  02/10/2021 FINDINGS: Similar cardiomegaly and central vascular congestion. Negative for CHF or definite pneumonia. Minor basilar atelectasis. No effusion or pneumothorax. Trachea midline. Aorta atherosclerotic. No acute osseous finding. Degenerative changes throughout the spine. IMPRESSION: Cardiomegaly without acute process Aortic Atherosclerosis (ICD10-I70.0). Electronically Signed   By: Jerilynn Mages.  Shick M.D.   On: 09/08/2021 10:23   DG Knee Complete 4 Views Right  Result Date: 09/08/2021 CLINICAL DATA:  Fall, left hip fracture EXAM: RIGHT KNEE - COMPLETE 4+ VIEW COMPARISON:  09/08/2021 FINDINGS: Bones are osteopenic. Diffuse right knee degenerative changes and  chondrocalcinosis. No acute osseous finding, fracture, malalignment or effusion. Peripheral atherosclerosis. No focal soft tissue abnormality. IMPRESSION: Osteopenia and degenerative changes. No acute finding by plain radiography Electronically Signed   By: Jerilynn Mages.  Shick M.D.   On: 09/08/2021 10:21   DG Hip Unilat W or Wo Pelvis 2-3 Views Left  Result Date: 09/08/2021  CLINICAL DATA:  Unwitnessed fall, pain EXAM: DG HIP (WITH OR WITHOUT PELVIS) 2-3V LEFT COMPARISON:  09/08/2021 FINDINGS: There is an acute displaced and mildly impacted left hip subcapital femoral neck fracture noted. Bones are osteopenic. Pelvis and right hip appear intact. Peripheral atherosclerosis noted. IMPRESSION: Acute displaced left hip subcapital femoral neck fracture. Electronically Signed   By: Jerilynn Mages.  Shick M.D.   On: 09/08/2021 10:20   DG Knee Complete 4 Views Left  Result Date: 09/08/2021 CLINICAL DATA:  Unwitnessed fall, dementia EXAM: LEFT KNEE - COMPLETE 4+ VIEW COMPARISON:  09/08/2021 FINDINGS: Bones are osteopenic. Degenerative changes throughout the knee and chondrocalcinosis. No acute osseous finding, fracture, malalignment, or effusion. Patella located. No soft tissue abnormality. Peripheral vascular calcifications present. IMPRESSION: Osteopenia and degenerative changes as above. No acute osseous finding. Peripheral atherosclerosis Electronically Signed   By: Jerilynn Mages.  Shick M.D.   On: 09/08/2021 10:17   CT Head Wo Contrast  Result Date: 09/08/2021 CLINICAL DATA:  Neck trauma (Age >= 65y); Head trauma, moderate-severe EXAM: CT HEAD WITHOUT CONTRAST CT CERVICAL SPINE WITHOUT CONTRAST TECHNIQUE: Multidetector CT imaging of the head and cervical spine was performed following the standard protocol without intravenous contrast. Multiplanar CT image reconstructions of the cervical spine were also generated. RADIATION DOSE REDUCTION: This exam was performed according to the departmental dose-optimization program which includes automated  exposure control, adjustment of the mA and/or kV according to patient size and/or use of iterative reconstruction technique. COMPARISON:  None Available. FINDINGS: CT HEAD FINDINGS Brain: No evidence of acute infarction, hemorrhage, hydrocephalus, extra-axial collection or mass lesion/mass effect. Cerebral atrophy. Vascular: Calcific intracranial atherosclerosis. Skull: No acute fracture. Sinuses/Orbits: Clear sinuses.  No acute orbital findings. Other: No mastoid effusions. CT CERVICAL SPINE FINDINGS Alignment: Unchanged alignment. Similar slight anterolisthesis of C7 on T1. Straightening. Skull base and vertebrae: Vertebral body heights are maintained. No evidence of acute fracture. Soft tissues and spinal canal: No prevertebral fluid or swelling. No visible canal hematoma. Disc levels: Multilevel degenerative change including degenerative disc disease, facet and uncovertebral hypertrophy with varying degrees of neural foraminal stenosis. Upper chest: Visualized lung apices are clear. Other: Extensive calcific atherosclerosis of the right greater than left carotid bifurcations. Subcentimeter thyroid nodules. Further imaging is not required (ref: J Am Coll Radiol. 2015 Feb;12(2): 143-50). IMPRESSION: 1. No evidence of acute intracranial abnormality. 2. No evidence of acute fracture or traumatic malalignment in the cervical spine. 3. Extensive calcific atherosclerosis of the right greater than left carotid bifurcations. Potentially hemodynamically significant stenosis on the right. A CT of the neck could further characterize if clinically warranted. Electronically Signed   By: Margaretha Sheffield M.D.   On: 09/08/2021 09:59   CT Cervical Spine Wo Contrast  Result Date: 09/08/2021 CLINICAL DATA:  Neck trauma (Age >= 65y); Head trauma, moderate-severe EXAM: CT HEAD WITHOUT CONTRAST CT CERVICAL SPINE WITHOUT CONTRAST TECHNIQUE: Multidetector CT imaging of the head and cervical spine was performed following the  standard protocol without intravenous contrast. Multiplanar CT image reconstructions of the cervical spine were also generated. RADIATION DOSE REDUCTION: This exam was performed according to the departmental dose-optimization program which includes automated exposure control, adjustment of the mA and/or kV according to patient size and/or use of iterative reconstruction technique. COMPARISON:  None Available. FINDINGS: CT HEAD FINDINGS Brain: No evidence of acute infarction, hemorrhage, hydrocephalus, extra-axial collection or mass lesion/mass effect. Cerebral atrophy. Vascular: Calcific intracranial atherosclerosis. Skull: No acute fracture. Sinuses/Orbits: Clear sinuses.  No acute orbital findings. Other: No mastoid effusions. CT CERVICAL SPINE  FINDINGS Alignment: Unchanged alignment. Similar slight anterolisthesis of C7 on T1. Straightening. Skull base and vertebrae: Vertebral body heights are maintained. No evidence of acute fracture. Soft tissues and spinal canal: No prevertebral fluid or swelling. No visible canal hematoma. Disc levels: Multilevel degenerative change including degenerative disc disease, facet and uncovertebral hypertrophy with varying degrees of neural foraminal stenosis. Upper chest: Visualized lung apices are clear. Other: Extensive calcific atherosclerosis of the right greater than left carotid bifurcations. Subcentimeter thyroid nodules. Further imaging is not required (ref: J Am Coll Radiol. 2015 Feb;12(2): 143-50). IMPRESSION: 1. No evidence of acute intracranial abnormality. 2. No evidence of acute fracture or traumatic malalignment in the cervical spine. 3. Extensive calcific atherosclerosis of the right greater than left carotid bifurcations. Potentially hemodynamically significant stenosis on the right. A CT of the neck could further characterize if clinically warranted. Electronically Signed   By: Margaretha Sheffield M.D.   On: 09/08/2021 09:59   CT PELVIS WO CONTRAST  Result  Date: 09/08/2021 CLINICAL DATA:  Hip trauma, fracture suspected, xray done EXAM: CT PELVIS WITHOUT CONTRAST TECHNIQUE: Multidetector CT imaging of the pelvis was performed following the standard protocol without intravenous contrast. RADIATION DOSE REDUCTION: This exam was performed according to the departmental dose-optimization program which includes automated exposure control, adjustment of the mA and/or kV according to patient size and/or use of iterative reconstruction technique. COMPARISON:  None Available. FINDINGS: Urinary Tract:  Distended bladder. Bowel:  Unremarkable visualized pelvic bowel loops. Vascular/Lymphatic: Aorto bi-iliac atherosclerosis. Reproductive:  No mass or other significant abnormality Musculoskeletal: Potentially acute or subacute impacted left subcapital femoral neck fracture. Approximately half shaft with superior displacement of the distal fracture fragment. Osteopenia. IMPRESSION: 1. Potentially acute or subacute impacted left subcapital femoral neck fracture. Approximately half shaft with superior displacement of the distal fracture fragment. 2. Osteopenia. 3. Aortic atherosclerosis (ICD10-I70.0). Electronically Signed   By: Margaretha Sheffield M.D.   On: 09/08/2021 09:54    Microbiology: Recent Results (from the past 240 hour(s))  Surgical pcr screen     Status: Abnormal   Collection Time: 09/09/21  9:59 AM   Specimen: Nasal Mucosa; Nasal Swab  Result Value Ref Range Status   MRSA, PCR NEGATIVE NEGATIVE Final   Staphylococcus aureus POSITIVE (A) NEGATIVE Final    Comment: (NOTE) The Xpert SA Assay (FDA approved for NASAL specimens in patients 45 years of age and older), is one component of a comprehensive surveillance program. It is not intended to diagnose infection nor to guide or monitor treatment. Performed at [facility], [place] [address] [place], [place] [address]      Labs: Basic Metabolic Panel: Recent Labs  Lab 09/14/21 0158 09/15/21 0341  09/16/21 0459 09/17/21 0330 09/18/21 0308  NA 140 140 136 136 138  K 5.0 4.7 4.2 4.7 4.6  CL 107 107 107 104 107  CO2 24 25 23 24 25   GLUCOSE 104* 100* 94 100* 124*  BUN 55* 51* 57* 60* 53*  CREATININE 2.85* 2.32* 2.28* 2.17* 2.19*  CALCIUM 8.2* 8.4* 8.2* 8.2* 8.4*  MG  --   --  2.3 2.2  --    Liver Function Tests: No results for input(s): "AST", "ALT", "ALKPHOS", "BILITOT", "PROT", "ALBUMIN" in the last 168 hours. No results for input(s): "LIPASE", "AMYLASE" in the last 168 hours. No results for input(s): "AMMONIA" in the last 168 hours. CBC: Recent Labs  Lab 09/14/21 0158 09/15/21 0341 09/16/21 0459 09/17/21 0330 09/18/21 0308  WBC 11.5* 11.4* 8.8 12.9* 11.1*  NEUTROABS  --   --   --   --  8.3*  HGB 9.9* 9.5* 8.7* 9.7* 9.3*  HCT 30.4* 29.8* 26.8* 29.9* 28.9*  MCV 95.9 96.8 95.0 97.1 97.0  PLT 232 254 235 246 243   Cardiac Enzymes: No results for input(s): "CKTOTAL", "CKMB", "CKMBINDEX", "TROPONINI" in the last 168 hours. BNP: BNP (last 3 results) No results for input(s): "BNP" in the last 8760 hours.  ProBNP (last 3 results) No results for input(s): "PROBNP" in the last 8760 hours.  CBG: Recent Labs  Lab 09/11/21 1201  GLUCAP 104*       Signed:  Nita Sells MD   [facility] 09/18/2021, 9:27 AM

## 2021-09-18 NOTE — NC FL2 (Addendum)
[place] LEVEL OF CARE SCREENING TOOL     IDENTIFICATION  Patient Name: Tanya Koch Birthdate: Oct 28, 1928 Sex: female Admission Date (Current Location): 09/08/2021  [facility] and [place] Number:  Herbalist and Address:  The . [facility], [place] [address], [place], [place] [address]      Provider Number: [id number]  Attending Physician Name and Address:  Nita Sells, MD  Relative Name and Phone Number:  Khalise, Billard [phone]    Current Level of Care: Hospital Recommended Level of Care: Memory Care Prior Approval Number:    Date Approved/Denied:   PASRR Number: [id number]  Discharge Plan: Other (Comment) (Memory Care)    Current Diagnoses: Patient Active Problem List   Diagnosis Date Noted   Closed left hip fracture, initial encounter ([place]) 09/08/2021   Hypertension 09/08/2021   Emphysema lung ([place]) 09/08/2021   Hyperlipidemia 09/08/2021   Hypothermia 09/08/2021   Normocytic anemia 09/08/2021   Leukocytosis 09/08/2021   Closed right hip fracture, sequela 02/10/2021   Dementia without behavioral disturbance ([place]) 12/05/2020   GERD (gastroesophageal reflux disease) 12/05/2020   Chronic kidney disease (CKD), stage IV (severe) ([place]) 07/30/2020    Orientation RESPIRATION BLADDER Height & Weight     Self  Normal Incontinent Weight: 151 lb 10.8 oz (68.8 kg) Height:  5\' 5"  (165.1 cm)  BEHAVIORAL SYMPTOMS/MOOD NEUROLOGICAL BOWEL NUTRITION STATUS      Continent Diet (Heart Healthy Diet)  AMBULATORY STATUS COMMUNICATION OF NEEDS Skin   Total Care Verbally Surgical wounds                       Personal Care Assistance Level of Assistance  Bathing, Feeding, Dressing, Total care Bathing Assistance: Maximum assistance Feeding assistance: Limited assistance Dressing Assistance: Maximum assistance Total Care Assistance: Maximum assistance   Functional Limitations Info  Sight, Hearing, Speech Sight Info:  Adequate Hearing Info: Impaired Speech Info: Adequate    SPECIAL CARE FACTORS FREQUENCY  PT (By licensed PT), OT (By licensed OT)     PT Frequency: 5x week OT Frequency: 5x week            Contractures Contractures Info: Not present    Additional Factors Info  Code Status, Allergies Code Status Info: DNR Allergies Info: NKA           Current Medications (09/18/2021):  This is the current hospital active medication list Current Facility-Administered Medications  Medication Dose Route Frequency Provider Last Rate Last Admin   acetaminophen (TYLENOL) tablet 325-650 mg  325-650 mg Oral Q6H PRN Leandrew Koyanagi, MD   325 mg at 09/17/21 1103   ALPRAZolam (XANAX) tablet 0.25 mg  0.25 mg Oral BID Leandrew Koyanagi, MD   0.25 mg at 09/17/21 2236   alum & mag hydroxide-simeth (MAALOX/MYLANTA) 200-200-20 MG/5ML suspension 30 mL  30 mL Oral Q4H PRN Leandrew Koyanagi, MD       calcium carbonate (TUMS - dosed in mg elemental calcium) chewable tablet 1,000 mg  1,000 mg Oral Daily Leandrew Koyanagi, MD   1,000 mg at 09/17/21 1102   docusate sodium (COLACE) capsule 100 mg  100 mg Oral BID Leandrew Koyanagi, MD   100 mg at 09/17/21 2235   enoxaparin (LOVENOX) injection 30 mg  30 mg Subcutaneous Q24H Regalado, Belkys A, MD   30 mg at 09/17/21 1103   escitalopram (LEXAPRO) tablet 10 mg  10 mg Oral Daily Leandrew Koyanagi, MD   10 mg at  09/17/21 1102   feeding supplement (ENSURE ENLIVE / ENSURE PLUS) liquid 237 mL  237 mL Oral BID BM Leandrew Koyanagi, MD   237 mL at 09/16/21 1634   ferrous sulfate tablet 325 mg  325 mg Oral Q breakfast Leandrew Koyanagi, MD   325 mg at 67/20/94 7096   folic acid (FOLVITE) tablet 1 mg  1 mg Oral Geanie Cooley, MD   1 mg at 09/17/21 1102   HYDROcodone-acetaminophen (NORCO) 7.5-325 MG per tablet 1-2 tablet  1-2 tablet Oral Q4H PRN Leandrew Koyanagi, MD   1 tablet at 09/12/21 2836   HYDROcodone-acetaminophen (NORCO/VICODIN) 5-325 MG per tablet 1-2 tablet  1-2 tablet Oral Q4H PRN Leandrew Koyanagi,  MD   1 tablet at 09/15/21 2238   HYDROmorphone (DILAUDID) injection 0.5 mg  0.5 mg Intravenous Q2H PRN Leandrew Koyanagi, MD   0.5 mg at 09/16/21 1217   magnesium oxide (MAG-OX) tablet 400 mg  400 mg Oral BID Leandrew Koyanagi, MD   400 mg at 09/17/21 2236   menthol-cetylpyridinium (CEPACOL) lozenge 3 mg  1 lozenge Oral PRN Leandrew Koyanagi, MD       Or   phenol (CHLORASEPTIC) mouth spray 1 spray  1 spray Mouth/Throat PRN Leandrew Koyanagi, MD       methocarbamol (ROBAXIN) tablet 500 mg  500 mg Oral Q6H PRN Leandrew Koyanagi, MD   500 mg at 09/15/21 2239   Or   methocarbamol (ROBAXIN) 500 mg in dextrose 5 % 50 mL IVPB  500 mg Intravenous Q6H PRN Leandrew Koyanagi, MD       metoprolol tartrate (LOPRESSOR) tablet 12.5 mg  12.5 mg Oral BID Nita Sells, MD   12.5 mg at 09/17/21 2324   morphine (PF) 2 MG/ML injection 0.5-1 mg  0.5-1 mg Intravenous Q2H PRN Leandrew Koyanagi, MD       multivitamin with minerals tablet 1 tablet  1 tablet Oral Daily Kc, Ramesh, MD   1 tablet at 09/17/21 1105   ondansetron (ZOFRAN) tablet 4 mg  4 mg Oral Q6H PRN Leandrew Koyanagi, MD       Or   ondansetron [facility]) injection 4 mg  4 mg Intravenous Q6H PRN Leandrew Koyanagi, MD       Oral care mouth rinse  15 mL Mouth Rinse PRN Kc, Ramesh, MD       oxyCODONE (Oxy IR/ROXICODONE) immediate release tablet 2.5 mg  2.5 mg Oral Q3H PRN Leandrew Koyanagi, MD       pantoprazole sodium (PROTONIX) 40 mg/20 mL oral suspension 40 mg  40 mg Oral Daily Nita Sells, MD   40 mg at 09/17/21 1103   polyethylene glycol (MIRALAX / GLYCOLAX) packet 17 g  17 g Oral Daily PRN Leandrew Koyanagi, MD       rosuvastatin (CRESTOR) tablet 10 mg  10 mg Oral QHS Leandrew Koyanagi, MD   10 mg at 09/17/21 2236   sorbitol 70 % solution 30 mL  30 mL Oral Daily PRN Leandrew Koyanagi, MD       tranexamic acid (CYKLOKAPRON) 2,000 mg in sodium chloride 0.9 % 50 mL Topical Application  6,294 mg Topical Once Leandrew Koyanagi, MD         Discharge Medications: STOP taking these medications      amLODipine 10 MG tablet Commonly known as: NORVASC    furosemide 20 MG tablet Commonly known as: LASIX  TAKE these medications     acetaminophen 325 MG tablet Commonly known as: TYLENOL Take 650 mg by mouth every 6 (six) hours as needed for moderate pain.    ALPRAZolam 0.25 MG tablet Commonly known as: XANAX Take 1 tablet (0.25 mg total) by mouth 2 (two) times daily for 4 doses. May also have 1 tablet daily as needed for anxiety    calcium carbonate 500 MG chewable tablet Commonly known as: TUMS - dosed in mg elemental calcium Chew 1,000 mg by mouth daily.    docusate sodium 100 MG capsule Commonly known as: COLACE Take 100 mg by mouth at bedtime.    enoxaparin 40 MG/0.4ML injection Commonly known as: LOVENOX Inject 0.4 mLs (40 mg total) into the skin daily for 14 days.    Ensure Take 237 mLs by mouth 2 (two) times daily between meals. What changed: Another medication with the same name was added. Make sure you understand how and when to take each.    feeding supplement Liqd Take 237 mLs by mouth 2 (two) times daily between meals. What changed: You were already taking a medication with the same name, and this prescription was added. Make sure you understand how and when to take each.    escitalopram 10 MG tablet Commonly known as: LEXAPRO Take 1 tablet (10 mg total) by mouth daily.    ferrous sulfate 325 (65 FE) MG tablet Take 325 mg by mouth daily with breakfast.    folic acid 1 MG tablet Commonly known as: FOLVITE Take 1 mg by mouth every other day.    HYDROcodone-acetaminophen 5-325 MG tablet Commonly known as: Norco Take 1 tablet by mouth every 6 (six) hours as needed.    magnesium oxide 400 (240 Mg) MG tablet Commonly known as: MAG-OX Take 400 mg by mouth 2 (two) times daily.    metoprolol tartrate 25 MG tablet Commonly known as: LOPRESSOR Take 0.5 tablets (12.5 mg total) by mouth 2 (two) times daily.    ondansetron 4 MG tablet Commonly  known as: ZOFRAN Take 1 tablet (4 mg total) by mouth every 8 (eight) hours as needed for nausea or vomiting.    pantoprazole 40 MG tablet Commonly known as: PROTONIX Take 40 mg by mouth daily.    polyethylene glycol 17 g packet Commonly known as: MIRALAX / GLYCOLAX Take 17 g by mouth daily as needed for mild constipation.    rosuvastatin 10 MG tablet Commonly known as: CRESTOR Take 10 mg by mouth at bedtime.    vitamin B-12 1000 MCG tablet Commonly known as: CYANOCOBALAMIN Take 2,000 mcg by mouth daily.    Zinc 30 MG Tabs Take 30 mg by mouth daily.   Relevant Imaging Results:  Relevant Lab Results:   Additional Information SSN: [id number], pt is vaccinated for covid with one booster.  [place], LCSWA

## 2021-09-19 ENCOUNTER — Other Ambulatory Visit (HOSPITAL_BASED_OUTPATIENT_CLINIC_OR_DEPARTMENT_OTHER): Payer: Self-pay

## 2021-09-22 ENCOUNTER — Emergency Department (HOSPITAL_COMMUNITY): Payer: Medicare Other

## 2021-09-22 ENCOUNTER — Emergency Department (HOSPITAL_COMMUNITY)
Admission: EM | Admit: 2021-09-22 | Discharge: 2021-09-22 | Disposition: A | Discharge status: Home / Self Care | Payer: Medicare Other | Attending: Emergency Medicine | Admitting: Emergency Medicine

## 2021-09-22 ENCOUNTER — Encounter (HOSPITAL_COMMUNITY): Payer: Self-pay

## 2021-09-22 DIAGNOSIS — J449 Chronic obstructive pulmonary disease, unspecified: Secondary | ICD-10-CM | POA: Diagnosis not present

## 2021-09-22 DIAGNOSIS — F0390 Unspecified dementia without behavioral disturbance: Secondary | ICD-10-CM | POA: Insufficient documentation

## 2021-09-22 DIAGNOSIS — M25552 Pain in left hip: Secondary | ICD-10-CM | POA: Insufficient documentation

## 2021-09-22 DIAGNOSIS — R21 Rash and other nonspecific skin eruption: Secondary | ICD-10-CM | POA: Insufficient documentation

## 2021-09-22 DIAGNOSIS — R001 Bradycardia, unspecified: Secondary | ICD-10-CM | POA: Diagnosis not present

## 2021-09-22 LAB — BASIC METABOLIC PANEL
Anion gap: 8 (ref 5–15)
BUN: 41 mg/dL — ABNORMAL HIGH (ref 8–23)
CO2: 22 mmol/L (ref 22–32)
Calcium: 8.2 mg/dL — ABNORMAL LOW (ref 8.9–10.3)
Chloride: 108 mmol/L (ref 98–111)
Creatinine, Ser: 2.18 mg/dL — ABNORMAL HIGH (ref 0.44–1.00)
GFR, Estimated: 21 mL/min — ABNORMAL LOW (ref >60)
Glucose, Bld: 98 mg/dL (ref 70–99)
Potassium: 4.8 mmol/L (ref 3.5–5.1)
Sodium: 138 mmol/L (ref 135–145)

## 2021-09-22 LAB — CBC WITH DIFFERENTIAL/PLATELET
Abs Immature Granulocytes: 0.06 10*3/uL (ref 0.00–0.07)
Basophils Absolute: 0.1 10*3/uL (ref 0.0–0.1)
Basophils Relative: 1 %
Eosinophils Absolute: 0.3 10*3/uL (ref 0.0–0.5)
Eosinophils Relative: 3 %
HCT: 28.6 % — ABNORMAL LOW (ref 36.0–46.0)
Hemoglobin: 9.2 g/dL — ABNORMAL LOW (ref 12.0–15.0)
Immature Granulocytes: 1 %
Lymphocytes Relative: 9 %
Lymphs Abs: 1 10*3/uL (ref 0.7–4.0)
MCH: 31.6 pg (ref 26.0–34.0)
MCHC: 32.2 g/dL (ref 30.0–36.0)
MCV: 98.3 fL (ref 80.0–100.0)
Monocytes Absolute: 1.4 10*3/uL — ABNORMAL HIGH (ref 0.1–1.0)
Monocytes Relative: 12 %
Neutro Abs: 8.3 10*3/uL — ABNORMAL HIGH (ref 1.7–7.7)
Neutrophils Relative %: 74 %
Platelets: 264 10*3/uL (ref 150–400)
RBC: 2.91 MIL/uL — ABNORMAL LOW (ref 3.87–5.11)
RDW: 16.8 % — ABNORMAL HIGH (ref 11.5–15.5)
WBC: 11.1 10*3/uL — ABNORMAL HIGH (ref 4.0–10.5)
nRBC: 0 % (ref 0.0–0.2)

## 2021-09-22 MED ORDER — DIPHENHYDRAMINE HCL 25 MG PO CAPS
25.0000 mg | ORAL_CAPSULE | Freq: Once | ORAL | Status: AC
  Administered 2021-09-22: 25 mg via ORAL
  Filled 2021-09-22: qty 1

## 2021-09-22 MED ORDER — DIPHENHYDRAMINE HCL 25 MG PO TABS: 12.5000 mg | ORAL_TABLET | Freq: Four times a day (QID) | ORAL | 0 refills | Status: AC | PRN

## 2021-09-22 NOTE — ED Provider Notes (Signed)
[facility] EMERGENCY DEPARTMENT Provider Note   CSN: [id number] Arrival date & time: 09/22/21  1110     History  Chief Complaint  Patient presents with   Hip Pain    Tanya Koch is a 88 y.o. female.  Tanya Koch is a 88 y.o. female with a history of dementia, COPD, CHF, MI, recent  hip fracture with surgical repair, who presents via EMS from [facility] facility due to reported hip pain and concern from facility for infection.  Facility reported that the patient was complaining of hip pain this morning.  Patient's family at bedside, reports that she is complained of some foot pain for them but no hip pain.  Facility reports no fevers.  They have not removed dressing since surgery.  Patient was discharged to [facility] 4 days ago, surgery done by Dr. Erlinda Hong on 7/14.  Patient with underlying dementia, able to provide very limited history.  46 of history obtained from nursing facility staff and family at bedside.  Nursing facility reports no new falls or injury to the hip that they are aware of.  The history is provided by the patient, a relative, the nursing home and medical records.  Hip Pain       Home Medications Prior to Admission medications   Medication Sig Start Date End Date Taking? Authorizing Provider  diphenhydrAMINE (BENADRYL) 25 MG tablet Take 0.5 tablets (12.5 mg total) by mouth every 6 (six) hours as needed. 09/22/21  Yes Jacqlyn Larsen, PA-C  acetaminophen (TYLENOL) 325 MG tablet Take 650 mg by mouth every 6 (six) hours as needed for moderate pain.    [provider]  calcium carbonate (TUMS - DOSED IN MG ELEMENTAL CALCIUM) 500 MG chewable tablet Chew 1,000 mg by mouth daily.    [provider]  docusate sodium (COLACE) 100 MG capsule Take 100 mg by mouth at bedtime.    [provider]  Ensure (ENSURE) Take 237 mLs by mouth 2 (two) times daily between meals.    [provider]   escitalopram (LEXAPRO) 10 MG tablet Take 1 tablet (10 mg total) by mouth daily. 09/18/21   Nita Sells, MD  feeding supplement (ENSURE ENLIVE / ENSURE PLUS) LIQD Take 237 mLs by mouth 2 (two) times daily between meals. 09/13/21   Antonieta Pert, MD  ferrous sulfate 325 (65 FE) MG tablet Take 325 mg by mouth daily with breakfast.    [provider]  folic acid (FOLVITE) 1 MG tablet Take 1 mg by mouth every other day. 08/04/21   [provider]  HYDROcodone-acetaminophen (NORCO) 5-325 MG tablet Take 1 tablet by mouth every 6 (six) hours as needed. 09/18/21   Nita Sells, MD  magnesium oxide (MAG-OX) 400 (240 Mg) MG tablet Take 400 mg by mouth 2 (two) times daily. 07/30/20   [provider]  metoprolol tartrate (LOPRESSOR) 25 MG tablet Take 0.5 tablets (12.5 mg total) by mouth 2 (two) times daily. 09/18/21   Nita Sells, MD  ondansetron (ZOFRAN) 4 MG tablet Take 1 tablet (4 mg total) by mouth every 8 (eight) hours as needed for nausea or vomiting. 09/18/21   Nita Sells, MD  pantoprazole (PROTONIX) 40 MG tablet Take 40 mg by mouth daily. 07/08/20   [provider]  polyethylene glycol (MIRALAX / GLYCOLAX) 17 g packet Take 17 g by mouth daily as needed for mild constipation. 09/13/21   Antonieta Pert, MD  rosuvastatin (CRESTOR) 10 MG tablet Take 10 mg  by mouth at bedtime. 06/20/20   [provider]  vitamin B-12 (CYANOCOBALAMIN) 1000 MCG tablet Take 2,000 mcg by mouth daily.    [provider]  Zinc 30 MG TABS Take 30 mg by mouth daily.    [provider]      Allergies    Patient has no known allergies.    Review of Systems   Review of Systems  Unable to perform ROS: Dementia    Physical Exam Updated Vital Signs BP (!) 167/44   Pulse (!) 45   Temp 97.7 F (36.5 C) (Oral)   Resp 16   SpO2 97%  Physical Exam Vitals and nursing note reviewed.  Constitutional:      General: She is not in acute distress.     Appearance: Normal appearance. She is well-developed. She is not diaphoretic.     Comments: Alert, demented but at baseline, not ill-appearing or in acute distress  HENT:     Head: Normocephalic and atraumatic.  Eyes:     General:        Right eye: No discharge.        Left eye: No discharge.     Pupils: Pupils are equal, round, and reactive to light.  Cardiovascular:     Rate and Rhythm: Regular rhythm. Bradycardia present.     Pulses: Normal pulses.     Heart sounds: Normal heart sounds.     Comments: Sinus bradycardia, baseline for patient Pulmonary:     Effort: Pulmonary effort is normal. No respiratory distress.     Breath sounds: Normal breath sounds. No wheezing or rales.     Comments: Respirations equal and unlabored, patient able to speak in full sentences, lungs clear to auscultation bilaterally  Abdominal:     General: Bowel sounds are normal. There is no distension.     Palpations: Abdomen is soft. There is no mass.     Tenderness: There is no abdominal tenderness. There is no guarding.     Comments: Abdomen soft, nondistended, nontender to palpation in all quadrants without guarding or peritoneal signs  Musculoskeletal:     Cervical back: Neck supple.     Comments: Hydrocolloid dressing removed from left hip, sutures are intact without overlying erythema, no drainage from wound or dehiscence, patient does not wince or withdraw to palpation over the hip, no palpable hematoma or fluid collection.  Patient able to raise leg.  Patient reports left foot pain, no deformity, no overlying wounds, erythema, 2+ DP and PT pulses.  No tenderness at the ankle or knee  Skin:    General: Skin is warm and dry.     Capillary Refill: Capillary refill takes less than 2 seconds.     Comments: Faint erythematous rash extending from right hip across abdomen no vesicles, pustules or petichiae  Neurological:     Mental Status: She is alert and oriented to person, place, and time.      Coordination: Coordination normal.     Comments: Speech is clear, able to follow commands Moves extremities without ataxia, coordination intact  Psychiatric:        Mood and Affect: Mood normal.        Behavior: Behavior normal.     ED Results / Procedures / Treatments   Labs (all labs ordered are listed, but only abnormal results are displayed) Labs Reviewed  BASIC METABOLIC PANEL - Abnormal; Notable for the following components:      Result Value   BUN 41 (*)  Creatinine, Ser 2.18 (*)    Calcium 8.2 (*)    GFR, Estimated 21 (*)    All other components within normal limits  CBC WITH DIFFERENTIAL/PLATELET - Abnormal; Notable for the following components:   WBC 11.1 (*)    RBC 2.91 (*)    Hemoglobin 9.2 (*)    HCT 28.6 (*)    RDW 16.8 (*)    Neutro Abs 8.3 (*)    Monocytes Absolute 1.4 (*)    All other components within normal limits    EKG None  Radiology CT Hip Left Wo Contrast  Result Date: 09/22/2021 CLINICAL DATA:  Left hip pain. Concern for infection. Left hip replacement 09/09/2021. EXAM: CT OF THE LEFT HIP WITHOUT CONTRAST TECHNIQUE: Multidetector CT imaging of the left hip was performed according to the standard protocol. Multiplanar CT image reconstructions were also generated. RADIATION DOSE REDUCTION: This exam was performed according to the departmental dose-optimization program which includes automated exposure control, adjustment of the mA and/or kV according to patient size and/or use of iterative reconstruction technique. COMPARISON:  Radiographs 09/22/2021 and 09/09/2021. FINDINGS: Bones/Joint/Cartilage Status post bipolar left hip hemiarthroplasty for a left femoral neck fracture. The hardware is intact without evidence of loosening. Allowing beam hardening artifact associated with the hardware, no evidence of acute fracture, dislocation or large hip joint effusion. Mild lower lumbar spondylosis noted. Ligaments Suboptimally assessed by CT. Muscles and Tendons  No focal intramuscular fluid collections or significant atrophy identified. Soft tissues Mild nonspecific subcutaneous edema anterolaterally without significant fluid collection. No unexpected foreign body or soft tissue emphysema. Iliofemoral atherosclerosis and distal colonic diverticulosis are noted. IMPRESSION: 1. No demonstrated complication following recent left hip bipolar hemiarthroplasty. 2. Nonspecific mild subcutaneous edema anterolateral without suspicious fluid collection. Assessment for a small effusion is limited by the beam hardening artifact, but no large joint effusion identified. 3. Distal colonic diverticulosis. Electronically Signed   By: Richardean Sale M.D.   On: 09/22/2021 15:09   DG Hip Unilat W or Wo Pelvis 2-3 Views Left  Result Date: 09/22/2021 CLINICAL DATA:  Pain and swelling after recent hip surgery EXAM: DG HIP (WITH OR WITHOUT PELVIS) 2-3V LEFT COMPARISON:  09/09/2021 and 09/08/2021 FINDINGS: Postoperative change of left arthroplasty. In comparison with fluoroscopic images from 09/09/2021, the acetabular cup appears more rotated laterally. No radiographic evidence of loosening. No fracture. Advanced osteoarthritis of the right hip. IMPRESSION: Left hip arthroplasty. The acetabular cup appears rotated slightly laterally compared to fluoroscopic images. This is of indeterminate significance and may be projectional/positional. No evidence of loosening or fracture. Electronically Signed   By: Placido Sou M.D.   On: 09/22/2021 12:16   DG Foot Complete Left  Result Date: 09/22/2021 CLINICAL DATA:  Left foot pain. Patient is nonverbal and altered mental status. Left hip surgery 4 days ago. EXAM: LEFT FOOT - COMPLETE 3+ VIEW COMPARISON:  None available FINDINGS: There is diffuse decreased bone mineralization. Mild joint space narrowing of the interphalangeal joints diffusely. Moderate medial and mild lateral aspect of the great toe metatarsophalangeal joint space narrowing. Mild  first through fifth tarsometatarsal joint space narrowing with first tarsometatarsal subchondral sclerosis degenerative change. Tiny plantar calcaneal heel spur. Mild chronic enthesopathic change at the Achilles insertion on the calcaneus. No acute fracture is seen. No dislocation. Mild vascular calcifications. IMPRESSION: 1. Mild midfoot and forefoot osteoarthritis. 2. Tiny plantar calcaneal heel spur. Electronically Signed   By: Yvonne Kendall M.D.   On: 09/22/2021 12:16    Procedures Procedures  Medications Ordered in ED Medications  diphenhydrAMINE (BENADRYL) capsule 25 mg (25 mg Oral Given 09/22/21 1407)    ED Course/ Medical Decision Making/ A&P                           Medical Decision Making Amount and/or Complexity of Data Reviewed Labs: ordered. Radiology: ordered.  Risk OTC drugs.   88 y.o. female presents to the ED with complaints of left hip pain, this involves an extensive number of treatment options, and is a complaint that carries with it a high risk of complications and morbidity.  The differential diagnosis includes postop infection, hematoma, seroma, typical postop pain, fracture or hardware issue  On arrival pt is nontoxic, vitals sinus bradycardia appears to be baseline for patient, hypertension, did not receive blood pressure medication prior to coming to the hospital. Exam significant for well-healing incision without overlying erythema or signs of wound infection, there is a faint erythematous rash, question whether this could be a reaction to the bandage, Benadryl given.  Additional history obtained from family at bedside, nursing facility staff. Previous records obtained and reviewed admission notes and op notes from recent hospitalization for hip fracture    Lab Tests:  I Ordered, reviewed, and interpreted labs, which included: Minimal leukocytosis, stable hemoglobin, creatinine at baseline, no significant electrolyte derangements  Imaging Studies  ordered:  I ordered imaging studies which included x-rays of the left hip and left foot, I independently visualized and interpreted imaging which showed left hip arthroplasty slight rotation of the acetabular cup, could be positional there is osteoarthritis of the foot and a tiny calcaneal heel spur but no other acute bony abnormalities.  ED Course:    I consulted orthopedics and discussed lab and imaging findings with PA Orion Crook, recommends CT of the left hip  CT reviewed and interpreted: No complication following recent hip hemiarthroplasty, mild subcutaneous edema but no fluid collection  I discussed overall reassuring CT with patient and family at bedside.  On review of MAR sent from skilled nursing facility it looks like patient has not received any doses of pain medication prescribed as needed postoperatively.  I suspect this is likely typical postoperative pain with increased movement since being discharged after recent surgery.  Benadryl as needed for rash or itching, encourage facility to use PRN Pain medication as needed.  At this time feel patient is appropriate for discharge back to skilled nursing facility.  Family in agreement.  At this time there does not appear to be any evidence of an acute emergency medical condition requiring further emergent evaluation and the patient appears stable for discharge with appropriate outpatient follow up. Diagnosis and return precautions discussed with patient who verbalizes understanding and is agreeable to discharge.     Portions of this note were generated with [employer]. Dictation errors may occur despite best attempts at proofreading.          Final Clinical Impression(s) / ED Diagnoses Final diagnoses:  Left hip pain  Rash    Rx / DC Orders ED Discharge Orders          Ordered    diphenhydrAMINE (BENADRYL) 25 MG tablet  Every 6 hours PRN        09/22/21 1540              Jacqlyn Larsen,  [place] 10/03/21 2223    Lucrezia Starch, MD 10/04/21 2135

## 2021-09-22 NOTE — Discharge Instructions (Addendum)
Incision over the left hip looks to be healing well, no signs of infection, no changes to the hardware or fluid collections or abscesses noted on CT scan.  Patient did have a faint red rash over the abdomen and hip it is unclear whether this is potentially due to a change in the brand of brief she has been wearing or due to dressing, this dressing was removed and changed today.  Use Benadryl 12.5 mg every 6 hours as needed for rash or itching.  Patient is certainly expected to have pain after recent hip surgery, she was discharged with hydrocodone-acetaminophen 5-325 mg every 6 hours as needed but it appears based on facility [facility] that this has not been given at all, I think it is reasonable to give this pain medication as needed if patient is complaining of hip pain.

## 2021-09-22 NOTE — ED Notes (Signed)
[facility] called, they stated "it wont be long"

## 2021-09-22 NOTE — ED Notes (Signed)
Got patient into a gown on the monitor got patient a warm blanket patient is resting with call bell in reach  

## 2021-09-22 NOTE — ED Notes (Signed)
All discharge paperwork and proper paperwork for [facility] sent with patient prior to leaving facility. Patient stable at time of discharge and was safely transported back to facility via [place].

## 2021-09-22 NOTE — ED Triage Notes (Signed)
Pt BIB [facility] from [facility] d/t hip pain and possible infection concern on L hip. Pt was dc'ed from the hospital 4 days ago after the hip surgery. Staff was concerned about infection on the hip as the pt c/o hip pain, however no redness, warmth, or swollen noted at the site. Pt has dementia, alert to person only. VSS.  174/60 94% RA HR 60  RR 16  CBG 90

## 2021-09-26 ENCOUNTER — Non-Acute Institutional Stay: Payer: Medicare Other | Admitting: *Deleted

## 2021-09-26 DIAGNOSIS — Z515 Encounter for palliative care: Secondary | ICD-10-CM

## 2021-09-27 ENCOUNTER — Non-Acute Institutional Stay: Payer: Medicare Other | Admitting: *Deleted

## 2021-09-27 DIAGNOSIS — Z515 Encounter for palliative care: Secondary | ICD-10-CM

## 2021-09-27 NOTE — Progress Notes (Signed)
[facility] PALLIATIVE CARE RN NOTE  PATIENT NAME: Nhung Danko DOB: June 16, 1928 MRN: [id number]  PRIMARY CARE PROVIDER: Earmon Phoenix, NP  RESPONSIBLE PARTY: Elton Sin (son) Acct ID - Guarantor Home Phone Work Phone Relationship Acct Type  [id number] IYAH, LAGUNA* [phone]  Self P/F     [address], [place], [place] [address]    RN telephonic encounter completed with patient's daughter in law [place]. She reports that she visited the facility today, [facility] care unit, to see patient. She was told by staff that patient did not want to get out of bed today, she didn't want to eat anything and also did not take any of her medications. To her knowledge, this is the first time she has refused her meds. Marcie Bal understands that patient is 88 years old and is ok with the fact that she has refused to get up or eat. Her main concern is that she has refused her medications. Education provided regarding disease progression and advised we will continue to monitor her overall condition to make an informed decision regarding next steps in patient's care. I will visit with patient later this week to see if this was a one time occurrence, or if these behaviors have continued. She is agreeable to this plan. Palliative care team will continue to follow.    Daryl Eastern, RN BSN

## 2021-09-27 NOTE — Progress Notes (Signed)
[facility] PALLIATIVE CARE RN NOTE  PATIENT NAME: Tanya Koch DOB: [birth year] MRN: [id number]  PRIMARY CARE PROVIDER: Earmon Phoenix, NP  RESPONSIBLE PARTY: Elton Sin (son) Acct ID - Guarantor Home Phone Work Phone Relationship Acct Type  [id number] [name], [name] [phone]  Self P/F     [address] [place] [address]   RN visit completed with patient at [facility]. Met with patient in her room.  Facility requested visit. They are concerned with patient's left hip incision possibly being infected from a recent hip surgery and a rash that she has on the trunk of her body.  Upon arrival, patient is sitting up in her wheelchair. She is alert and oriented x 2 (person/place). She is able to answer simple questions and follow commands. Assessed her hip incision. There is some redness noted along portions of the incision. At the top of the incision, it is more red, swollen and has a small amount of yellowish drainage. Area is open to air and patient does pick at it. She also has a rash that appears to be resolving on her chest, abdomen, sides and back. Areas are dull pink in color. There are a few scattered needle point scabs noted across her abdomen where she had been scratching. They have been giving her Benadryl prn which appears to be effective.  I took a secure picture of her incision so our palliative NP could assess it. She recommends patient be started on Cephalexin 500 mg po TID x 5 days and Florastor 250 mg BID x 7 days. I reached out to [employer] and spoke with [facility] there. They have not yet seen the patient and do not currently have her showing as their patient, as she has only been at the facility for about a week.  I called [facility] and spoke with their [place] to make her aware. She states that the paperwork was sent to [facility] for them to begin following her. NP comes to that facility on Tuesdays and Fridays. Since patient is  in transition, NP gave me permission to fax the orders for Cephalexin and Florastor to the facility. Orders faxed and confirmation fax received.  CODE STATUS: DNR ADVANCED DIRECTIVES: Y MOST FORM: no PPS: 30%   PHYSICAL EXAM:  LUNGS: clear to auscultation  CARDIAC: Cor RRR EXTREMITIES: Edema noted to bilateral lower extremities, L>R SKIN:  See above   NEURO:  Alert and oriented x 2, pleasant mood, intermittent confusion, HOH, requires assistance with all ADLs, propels self in wheelchair, requires assistance w/standing/transfers.   Daryl Eastern, RN BSN

## 2021-09-30 ENCOUNTER — Non-Acute Institutional Stay: Payer: Medicare Other | Admitting: *Deleted

## 2021-09-30 DIAGNOSIS — Z515 Encounter for palliative care: Secondary | ICD-10-CM

## 2021-10-04 ENCOUNTER — Ambulatory Visit (INDEPENDENT_AMBULATORY_CARE_PROVIDER_SITE_OTHER): Payer: Medicare Other | Admitting: Physician Assistant

## 2021-10-04 DIAGNOSIS — Z96642 Presence of left artificial hip joint: Secondary | ICD-10-CM

## 2021-10-04 NOTE — Progress Notes (Signed)
Post-Op Visit Note   Patient: Tanya Koch           Date of Birth: [birth year]           MRN: [id number] Visit Date: 10/04/2021 PCP: Earmon Phoenix, NP   Assessment & Plan:  Chief Complaint:  Chief Complaint  Patient presents with   Left Hip - Follow-up   Visit Diagnoses:  1. S/P hip replacement, left     Plan: Patient is a pleasant 88 year old female who is here today with her daughter.  She is pleasantly demented.  She has approximately 3 weeks status post left hip hemiarthroplasty from a femoral neck fracture, date of surgery 09/09/2021.  She has been doing well.  She has been been residing at a SNF but has recently been transferred back to her assisted living facility.  She was seen in the ED for questionable infection on 09/22/2021.  Likelihood of this was low but she was placed on Keflex.  She finished this about a week ago.  No fevers or chills.  Her daughter states that today she was able to get out of bed and transfer to her chair alone without any issues.  Overall, she appears to be doing much better.  Examination of the left hip reveals a well healing surgical incision.  2 nylon sutures are intact.  She does have a small scab to the middle of the incision.  No drainage, erythema, swelling, induration or any other signs of infection or cellulitis.  Today, the remaining sutures were removed and Steri-Strips applied.  She will weight-bear as tolerated and work with physical therapy.  She will follow-up with [place] in 3 weeks time for repeat evaluation and AP pelvis x-rays.  Call with concerns or questions in the meantime.  Follow-Up Instructions: Return in about 3 weeks (around 10/25/2021).   Orders:  No orders of the defined types were placed in this encounter.  No orders of the defined types were placed in this encounter.   Imaging: No new imaging  PMFS History: Patient Active Problem List   Diagnosis Date Noted   Closed left hip fracture, initial encounter ([place]) 09/08/2021    Hypertension 09/08/2021   Emphysema lung ([place]) 09/08/2021   Hyperlipidemia 09/08/2021   Hypothermia 09/08/2021   Normocytic anemia 09/08/2021   Leukocytosis 09/08/2021   Closed right hip fracture, sequela 02/10/2021   Dementia without behavioral disturbance ([place]) 12/05/2020   GERD (gastroesophageal reflux disease) 12/05/2020   Chronic kidney disease (CKD), stage IV (severe) ([place]) 07/30/2020   Past Medical History:  Diagnosis Date   Bradycardia    CHF (congestive heart failure) (HCC)    Closed right hip fracture, sequela 02/10/2021   COVID 12/06/2020   Emphysema, unspecified ([place])    Hematemesis 12/05/2020   History of MI (myocardial infarction)    Hypertension     Family History  Problem Relation Age of Onset   Hypertension Other     Past Surgical History:  Procedure Laterality Date   HIP ARTHROPLASTY Right    TOTAL HIP ARTHROPLASTY Left 09/09/2021   Procedure: LEFT HIP HEMI ARTHROPLASTY;  Surgeon: Leandrew Koyanagi, MD;  Location: [facility];  Service: Orthopedics;  Laterality: Left;   Social History   Occupational History   Not on file  Tobacco Use   Smoking status: Former    Years: 50.00    Types: Cigarettes    Passive exposure: Never   Smokeless tobacco: Never  Substance and Sexual Activity   Alcohol use: Not Currently  Drug use: Never   Sexual activity: Not Currently

## 2021-11-03 ENCOUNTER — Other Ambulatory Visit: Payer: Self-pay

## 2021-11-03 ENCOUNTER — Emergency Department (HOSPITAL_COMMUNITY): Payer: Medicare Other

## 2021-11-03 ENCOUNTER — Encounter (HOSPITAL_COMMUNITY): Payer: Self-pay

## 2021-11-03 ENCOUNTER — Emergency Department (HOSPITAL_BASED_OUTPATIENT_CLINIC_OR_DEPARTMENT_OTHER): Payer: Medicare Other

## 2021-11-03 ENCOUNTER — Telehealth: Payer: Self-pay

## 2021-11-03 ENCOUNTER — Observation Stay (HOSPITAL_BASED_OUTPATIENT_CLINIC_OR_DEPARTMENT_OTHER)
Admission: EM | Admit: 2021-11-03 | Discharge: 2021-11-04 | Disposition: A | Discharge status: Skilled Nursing Facility | Payer: Medicare Other | Source: Home / Self Care | Attending: Emergency Medicine | Admitting: Emergency Medicine

## 2021-11-03 ENCOUNTER — Observation Stay (HOSPITAL_COMMUNITY): Payer: Medicare Other

## 2021-11-03 DIAGNOSIS — N184 Chronic kidney disease, stage 4 (severe): Secondary | ICD-10-CM | POA: Diagnosis present

## 2021-11-03 DIAGNOSIS — R1310 Dysphagia, unspecified: Secondary | ICD-10-CM

## 2021-11-03 DIAGNOSIS — I4891 Unspecified atrial fibrillation: Secondary | ICD-10-CM | POA: Diagnosis not present

## 2021-11-03 DIAGNOSIS — J449 Chronic obstructive pulmonary disease, unspecified: Secondary | ICD-10-CM | POA: Insufficient documentation

## 2021-11-03 DIAGNOSIS — Z87891 Personal history of nicotine dependence: Secondary | ICD-10-CM | POA: Insufficient documentation

## 2021-11-03 DIAGNOSIS — Z7982 Long term (current) use of aspirin: Secondary | ICD-10-CM | POA: Insufficient documentation

## 2021-11-03 DIAGNOSIS — D631 Anemia in chronic kidney disease: Secondary | ICD-10-CM | POA: Insufficient documentation

## 2021-11-03 DIAGNOSIS — R609 Edema, unspecified: Secondary | ICD-10-CM

## 2021-11-03 DIAGNOSIS — I130 Hypertensive heart and chronic kidney disease with heart failure and stage 1 through stage 4 chronic kidney disease, or unspecified chronic kidney disease: Secondary | ICD-10-CM | POA: Insufficient documentation

## 2021-11-03 DIAGNOSIS — I5042 Chronic combined systolic (congestive) and diastolic (congestive) heart failure: Secondary | ICD-10-CM | POA: Insufficient documentation

## 2021-11-03 DIAGNOSIS — I509 Heart failure, unspecified: Secondary | ICD-10-CM

## 2021-11-03 DIAGNOSIS — I482 Chronic atrial fibrillation, unspecified: Secondary | ICD-10-CM | POA: Insufficient documentation

## 2021-11-03 DIAGNOSIS — Z8616 Personal history of COVID-19: Secondary | ICD-10-CM | POA: Insufficient documentation

## 2021-11-03 DIAGNOSIS — Z96643 Presence of artificial hip joint, bilateral: Secondary | ICD-10-CM | POA: Insufficient documentation

## 2021-11-03 DIAGNOSIS — I5043 Acute on chronic combined systolic (congestive) and diastolic (congestive) heart failure: Secondary | ICD-10-CM | POA: Diagnosis not present

## 2021-11-03 DIAGNOSIS — R0602 Shortness of breath: Secondary | ICD-10-CM

## 2021-11-03 DIAGNOSIS — I2510 Atherosclerotic heart disease of native coronary artery without angina pectoris: Secondary | ICD-10-CM | POA: Insufficient documentation

## 2021-11-03 DIAGNOSIS — M7989 Other specified soft tissue disorders: Secondary | ICD-10-CM | POA: Diagnosis not present

## 2021-11-03 DIAGNOSIS — N179 Acute kidney failure, unspecified: Secondary | ICD-10-CM

## 2021-11-03 DIAGNOSIS — F419 Anxiety disorder, unspecified: Secondary | ICD-10-CM

## 2021-11-03 DIAGNOSIS — Z79899 Other long term (current) drug therapy: Secondary | ICD-10-CM | POA: Insufficient documentation

## 2021-11-03 DIAGNOSIS — E875 Hyperkalemia: Secondary | ICD-10-CM | POA: Insufficient documentation

## 2021-11-03 DIAGNOSIS — R7989 Other specified abnormal findings of blood chemistry: Secondary | ICD-10-CM

## 2021-11-03 DIAGNOSIS — J439 Emphysema, unspecified: Secondary | ICD-10-CM | POA: Diagnosis present

## 2021-11-03 DIAGNOSIS — F0390 Unspecified dementia without behavioral disturbance: Secondary | ICD-10-CM | POA: Diagnosis present

## 2021-11-03 LAB — BASIC METABOLIC PANEL
Anion gap: 11 (ref 5–15)
Anion gap: 11 (ref 5–15)
BUN: 44 mg/dL — ABNORMAL HIGH (ref 8–23)
BUN: 44 mg/dL — ABNORMAL HIGH (ref 8–23)
CO2: 21 mmol/L — ABNORMAL LOW (ref 22–32)
CO2: 19 mmol/L — ABNORMAL LOW (ref 22–32)
Calcium: 8.6 mg/dL — ABNORMAL LOW (ref 8.9–10.3)
Calcium: 8.3 mg/dL — ABNORMAL LOW (ref 8.9–10.3)
Chloride: 108 mmol/L (ref 98–111)
Chloride: 111 mmol/L (ref 98–111)
Creatinine, Ser: 2.79 mg/dL — ABNORMAL HIGH (ref 0.44–1.00)
Creatinine, Ser: 2.59 mg/dL — ABNORMAL HIGH (ref 0.44–1.00)
GFR, Estimated: 16 mL/min — ABNORMAL LOW (ref >60)
GFR, Estimated: 17 mL/min — ABNORMAL LOW (ref >60)
Glucose, Bld: 119 mg/dL — ABNORMAL HIGH (ref 70–99)
Glucose, Bld: 118 mg/dL — ABNORMAL HIGH (ref 70–99)
Potassium: 5.6 mmol/L — ABNORMAL HIGH (ref 3.5–5.1)
Potassium: 5.5 mmol/L — ABNORMAL HIGH (ref 3.5–5.1)
Sodium: 140 mmol/L (ref 135–145)
Sodium: 141 mmol/L (ref 135–145)

## 2021-11-03 LAB — CBC
HCT: 32.7 % — ABNORMAL LOW (ref 36.0–46.0)
Hemoglobin: 10.5 g/dL — ABNORMAL LOW (ref 12.0–15.0)
MCH: 32.5 pg (ref 26.0–34.0)
MCHC: 32.1 g/dL (ref 30.0–36.0)
MCV: 101.2 fL — ABNORMAL HIGH (ref 80.0–100.0)
Platelets: 201 10*3/uL (ref 150–400)
RBC: 3.23 MIL/uL — ABNORMAL LOW (ref 3.87–5.11)
RDW: 20.2 % — ABNORMAL HIGH (ref 11.5–15.5)
WBC: 8.1 10*3/uL (ref 4.0–10.5)
nRBC: 0 % (ref 0.0–0.2)

## 2021-11-03 LAB — TROPONIN I (HIGH SENSITIVITY)
Troponin I (High Sensitivity): 44 ng/L — ABNORMAL HIGH (ref <18)
Troponin I (High Sensitivity): 39 ng/L — ABNORMAL HIGH (ref <18)

## 2021-11-03 LAB — BRAIN NATRIURETIC PEPTIDE: B Natriuretic Peptide: 2876.4 pg/mL — ABNORMAL HIGH (ref 0.0–100.0)

## 2021-11-03 LAB — MAGNESIUM: Magnesium: 2.3 mg/dL (ref 1.7–2.4)

## 2021-11-03 MED ORDER — SODIUM CHLORIDE 0.9% FLUSH
3.0000 mL | Freq: Two times a day (BID) | INTRAVENOUS | Status: DC
  Administered 2021-11-03 – 2021-11-04 (×3): 3 mL via INTRAVENOUS

## 2021-11-03 MED ORDER — ROSUVASTATIN CALCIUM 5 MG PO TABS
10.0000 mg | ORAL_TABLET | Freq: Every day | ORAL | Status: DC
  Administered 2021-11-03: 10 mg via ORAL
  Filled 2021-11-03: qty 2

## 2021-11-03 MED ORDER — ALPRAZOLAM 0.25 MG PO TABS
0.2500 mg | ORAL_TABLET | Freq: Two times a day (BID) | ORAL | Status: DC | PRN
Start: 2021-11-03 — End: 2021-11-04
  Filled 2021-11-03: qty 1

## 2021-11-03 MED ORDER — PANTOPRAZOLE SODIUM 40 MG PO TBEC
40.0000 mg | DELAYED_RELEASE_TABLET | Freq: Every day | ORAL | Status: DC
  Administered 2021-11-04: 40 mg via ORAL
  Filled 2021-11-03 (×2): qty 1

## 2021-11-03 MED ORDER — SODIUM CHLORIDE 0.9% FLUSH: 3.0000 mL | INTRAVENOUS | Status: DC | PRN

## 2021-11-03 MED ORDER — ONDANSETRON HCL 4 MG/2ML IJ SOLN: 4.0000 mg | Freq: Four times a day (QID) | INTRAMUSCULAR | Status: DC | PRN

## 2021-11-03 MED ORDER — METOPROLOL TARTRATE 12.5 MG HALF TABLET
12.5000 mg | ORAL_TABLET | Freq: Two times a day (BID) | ORAL | Status: DC
  Administered 2021-11-03 – 2021-11-04 (×3): 12.5 mg via ORAL
  Filled 2021-11-03 (×3): qty 1

## 2021-11-03 MED ORDER — SODIUM ZIRCONIUM CYCLOSILICATE 10 G PO PACK
10.0000 g | PACK | Freq: Every day | ORAL | Status: DC
  Administered 2021-11-04: 10 g via ORAL
  Filled 2021-11-03: qty 1

## 2021-11-03 MED ORDER — ACETAMINOPHEN 325 MG PO TABS: 650.0000 mg | ORAL_TABLET | ORAL | Status: DC | PRN

## 2021-11-03 MED ORDER — ASPIRIN 81 MG PO CHEW
81.0000 mg | CHEWABLE_TABLET | Freq: Every day | ORAL | Status: DC
  Administered 2021-11-04: 81 mg via ORAL
  Filled 2021-11-03: qty 1

## 2021-11-03 MED ORDER — FUROSEMIDE 10 MG/ML IJ SOLN
40.0000 mg | Freq: Once | INTRAMUSCULAR | Status: AC
  Administered 2021-11-03: 40 mg via INTRAVENOUS
  Filled 2021-11-03: qty 4

## 2021-11-03 MED ORDER — DOCUSATE SODIUM 100 MG PO CAPS
100.0000 mg | ORAL_CAPSULE | Freq: Two times a day (BID) | ORAL | Status: DC
  Administered 2021-11-03 (×2): 100 mg via ORAL
  Filled 2021-11-03 (×2): qty 1

## 2021-11-03 MED ORDER — CALCIUM CARBONATE ANTACID 500 MG PO CHEW
1000.0000 mg | CHEWABLE_TABLET | Freq: Every day | ORAL | Status: DC
  Administered 2021-11-04: 1000 mg via ORAL
  Filled 2021-11-03 (×2): qty 5

## 2021-11-03 MED ORDER — SODIUM CHLORIDE 0.9 % IV SOLN: 250.0000 mL | INTRAVENOUS | Status: DC | PRN

## 2021-11-03 MED ORDER — ESCITALOPRAM OXALATE 10 MG PO TABS
10.0000 mg | ORAL_TABLET | Freq: Every day | ORAL | Status: DC
  Administered 2021-11-04: 10 mg via ORAL
  Filled 2021-11-03: qty 1

## 2021-11-03 MED ORDER — HEPARIN SODIUM (PORCINE) 5000 UNIT/ML IJ SOLN
5000.0000 [IU] | Freq: Three times a day (TID) | INTRAMUSCULAR | Status: DC
  Administered 2021-11-03 – 2021-11-04 (×4): 5000 [IU] via SUBCUTANEOUS
  Filled 2021-11-03 (×4): qty 1

## 2021-11-03 NOTE — Assessment & Plan Note (Addendum)
CKD stage 4. Cr trending up (baseline ~2.5), GFR 16 (21 in July) - K 5.6 > 5.1, consider 10 g Lokelma if elevates again - Avoid nephrotoxic agents - Trend BMP - Strict I/Os

## 2021-11-03 NOTE — Progress Notes (Signed)
[facility]) Hospital Liaison note:  This patient is currently enrolled in [facility] outpatient-based Palliative Care. Will continue to follow for disposition.  Please call with any outpatient palliative questions or concerns.  Thank you, Lorelee Market, LPN [facility] Liaison [phone]

## 2021-11-03 NOTE — Assessment & Plan Note (Addendum)
25 pack year smoking history, not on home oxygen or inhalers. Not a current smoker. - Monitor O2 status

## 2021-11-03 NOTE — H&P (Addendum)
Hospital Admission History and Physical Service Pager: [phone]  Patient name: Tanya Koch Medical record number: [id number] Date of Birth: 24-Jul-1928 Age: 88 y.o. Gender: female  Primary Care Provider: Earmon Phoenix, NP Consultants: Cardio  Code Status: DNR/DNI which was confirmed with family Preferred Emergency Contact:  Contact Information     Name Relation Home Work [place]home Son [phone]  [phone]   [place] Other   [phone]      Chief Complaint: SOB, elevated HR   Assessment and Plan: Tanya Koch is a 88 y.o. female presenting with SOB and elevated HR. Differential for this patient's presentation of this includes systolic CHF exacerbation with Afib (most likely based on CXR findings), aspiration PNA (due to dysphagia history, although unlikely with afebrile and no white count), PE (given left hip replacement in July but unlikely given Wells 1.5), COPD exacerbation (given heavy smoking history with no true COPD dx).  * Acute exacerbation of CHF (congestive heart failure) ([place]) Presented with shortness of breath without true hypoxemia (saturating 95%+ on room air) likely 2/2 systolic CHF. Most recent Echo 03/03/21 showed EF 55-60%. PE is on differential given hip surgery on 7/23 however Wells score 1.5, on aspirin 81 mg, cannot obtain CT angiogram given CKD4. Can consider V/Q scan if respiratory status worsens.  - IV Lasix 40 mg, curbsided nephro for assistance  - Strict I/Os and daily weights  Dysphagia Family reports 6 month history of vomiting up soft food diet. No known aspiration event. CXR showed right lower lobe opacity. Non-infectious presentation; afebrile, WBC 8.1.  - Speech consulted for swallow study, appreciate recs    Atrial fibrillation (HCC) Chronic, followed by [facility].  - Admit to cardiac monitoring med tele, curbside Heart care  - Continue home Aspirin 81 mg  - Continue home Metoprolol 12.5 mg  Emphysema lung (HCC) 25  pack year smoking history, not on home oxygen or inhalers. Not a current smoker. - Monitor O2 status   Chronic kidney disease (CKD), stage IV (severe) (HCC) CKD stage 4. Cr 2.79 (baseline ~2.5), GFR 16 (21 in July) - Stat repeat BMP for K 5.6, consider Lokelma if K persistently elevated - Avoid nephrotoxic agents  Dementia without behavioral disturbance (HCC) Chronic, presented from memory care unit  - Plan to return to memory care on discharge  - On Xanax and Lexapro for anxiety    Chronic: Crestor 10 mg for HLD, TUMS, Protonix 40 mg and Zofran 4 mg for chronic dysphagia/vomiting, Colace 100 mg for constipation, Xanax 0.25mg  and Lexapro 10 mg for anxiety, Tylenol PRN for pain.   FEN/GI: NPO until swallow study per speech  VTE Prophylaxis: Heparin 5,000 units SQ  Disposition: Pending diuresis and echo   History of Present Illness:  Tanya Koch is a 88 y.o. female presenting with SOB. Pleasantly demented, so history was provided by son and daughter-in-law at bedside. This AM, [facility] (memory care facility), the patient experienced onset of SOB and had an elevated heart rate. She had been staying at an assisted living facility for about a year and transitioned to [facility] for the memory care unit. She has had about a 6 month history of dysphagia and vomiting up food; she is normally on a soft foods diet. She also has a history of atrial fibrillation and follows with [facility]. Finally, she is s/p left hip hemiarthroplasty on 09/09/21.   In the ED, BMP revealed K 5.6, WBC 8.1, Cr 2.79 (baseline ~2.5), GFR 16 (21  in July). Trops 44. BNP 2,876.4. CrCl 12.8. EKG showed atrial fibrillation. CXR showed patchy right lower lobe airspace opacity and small right pleural effusion. Wells score 1.5 as hip hemiarthroplasty was outside of 4 week window.    Review Of Systems: Per HPI. Unable to obtain because patient pleasantly demented.   Pertinent Past Medical History: MI 2017 Dementia   CKD stage 4  Diverticulosis  CHF  Remainder reviewed in history tab.   Pertinent Past Surgical History: Past Surgical History:  Procedure Laterality Date   HIP ARTHROPLASTY Right    TOTAL HIP ARTHROPLASTY Left 09/09/2021   Procedure: LEFT HIP HEMI ARTHROPLASTY;  Surgeon: Leandrew Koyanagi, MD;  Location: [facility];  Service: Orthopedics;  Laterality: Left;    Remainder reviewed in history tab.  Pertinent Social History: Tobacco use: Former--discontinued 6 years ago. Smoked 50 years 1/2 pack per day prior to this  Alcohol use: None  Other Substance use: No Lives in memory care unit  Pertinent Family History: Family History  Problem Relation Age of Onset   Hypertension Other   MI in father  Colon cancer in mother   Remainder reviewed in history tab.   Important Outpatient Medications: Metoprolol  Crestor  Lexapro  Xanax   Remainder reviewed in medication history.   Objective: BP (!) 147/129   Pulse (!) 132   Temp 98.3 F (36.8 C) (Oral)   Resp 20   Ht 5\' 5"  (1.651 m)   Wt 68.5 kg   SpO2 97%   BMI 25.13 kg/m  Exam: General: Pleasantly demented, alert, non-toxic/non-infectious appearing, resting comfortably   Eyes: EOM intact bilaterally Neck: No lymphadenopathy or thyromegaly.  Cardiovascular: Irregularly irregular rhythm, tachycardic, S1 S2 present. No rubs, murmurs, gallops.  Respiratory: Bibasilar crackles, normal WOB without accessory muscle use  Gastrointestinal: Abdomen soft, non-distended, not tender to palpation, +BS in all 4 quadrants  Extremities: 2+ pitting edema in bilateral lower extremities.  Neuro: No slurred speech.   Labs:  CBC BMET  Recent Labs  Lab 11/03/21 1000  WBC 8.1  HGB 10.5*  HCT 32.7*  PLT 201   Recent Labs  Lab 11/03/21 1000  NA 140  K 5.6*  CL 108  CO2 21*  BUN 44*  CREATININE 2.79*  GLUCOSE 119*  CALCIUM 8.6*    Pertinent additional labs  Trops 44.  BNP 2,876.4.  CrCl 12.8.   EKG: Atrial fibrillation, prolonged  QT interval, Qtc 515  Imaging Studies Performed:  CXR IMPRESSION: Patchy right lower lobe airspace opacity, could represent pneumonia or asymmetric edema. Small right pleural effusion.  My Interpretation: opacity in right lower lung field, right costophrenic angle blunted   VAS [place] LE VENOUS (DVT) Summary:  RIGHT: - No evidence of common femoral vein obstruction.  LEFT:  - There is no evidence of deep vein thrombosis in the lower extremity.  - No cystic structure found in the popliteal fossa.   [name], Medical Student 11/03/2021, 3:12 PM [facility] Intern pager: [phone], text pages welcome Secure chat group [facility] Teaching Service   Resident attestation: I agree with the documentation of Student Dr. [name] above. I have made adjustments to her note as appropriate. I have seen the patient and performed physical exam on the patient consistent with her documented physical exam above.   Continued diuresis in the setting of CKD  Monitor tenuous volume status  RA, reassuringly HDS  No infectious cause of opacity on CXR, seemingly fluid overload with  relation to uncontrolled Afib and CHF   Erskine Emery, MD

## 2021-11-03 NOTE — Assessment & Plan Note (Addendum)
Chronic, followed by [facility].  - Admit to cardiac monitoring med tele, curbside Heart care  - Continue home Aspirin 81 mg  - Continue home Metoprolol 12.5 mg

## 2021-11-03 NOTE — ED Triage Notes (Signed)
Patient from [facility] memory care with complaints of SOB this am.  Patient found in afib with no hx and new LBBB.

## 2021-11-03 NOTE — Progress Notes (Signed)
[facility] PALLIATIVE CARE RN NOTE  PATIENT NAME: Tanya Koch DOB: 1928/07/14 MRN: [id number]  PRIMARY CARE PROVIDER: Earmon Phoenix, NP  RESPONSIBLE PARTY: Elton Sin (son) Acct ID - Guarantor Home Phone Work Phone Relationship Acct Type  [id number] AMERE, IOTT* [phone]  Self P/F     [address] [place], [place] [address]    RN visit completed with patient at [facility] memory care unit. Daughter-in-law requested follow up as earlier this week, patient had a day where she did not want to get out of bed or take any of her medications. Upon arrival, patient is sitting up in the dining room waiting to eat lunch. Mood is pleasant. Denies pain or discomfort. Spoke with Fatou who says that patient has been getting up and taking her medications without difficulty for the past 2 days. Facility also received order I sent for Keflex and Florastor due to infection of left hip incision as requested by Laverda Sorenson NP. No concerns at this time. Called and spoke with son to provide patient update. Palliative care team will continue to follow.   Daryl Eastern, RN BSN

## 2021-11-03 NOTE — ED Notes (Signed)
Pt had anxiety attack, pt is now resting eyes with bilateral chest rise and fall. Will reapproach with PO meds at a later time.

## 2021-11-03 NOTE — ED Notes (Signed)
Pt placed on purewick 

## 2021-11-03 NOTE — Assessment & Plan Note (Addendum)
Presented with shortness of breath without true hypoxemia (saturating 95%+ on room air) likely 2/2 systolic CHF. Most recent Echo 03/03/21 showed EF 55-60%.   - IV Lasix 40 mg, curbsided nephro for assistance  - Strict I/Os and daily weights - Trend CBC, BMP  - continuous cardiac monitoring  - VS per floor protocol

## 2021-11-03 NOTE — Telephone Encounter (Signed)
845 am.  Incoming call from [employer] of Memory Care at [facility]  Patient is having difficulty breathing and EMS has been contacted.  Patient is being evaluated a this time.  Uncertain if she will be sent to the ED.  Will update primary team and hospital liaison.

## 2021-11-03 NOTE — ED Notes (Addendum)
Pt recurrently saying "I need help, I can not breath. Something bad is going to happen." No s/s of sob, pt unable to get comfortable.

## 2021-11-03 NOTE — Progress Notes (Signed)
SLP Cancellation Note  Patient Details Name: Tanya Koch MRN: [id number] DOB: 1928-03-04   Cancelled treatment:       Reason Eval/Treat Not Completed: Medical issues which prohibited therapy. Attempted to see pt for swallow and RN requested SLP return tomorrow. Pt stating she "can't breathe" and cannot get her comfortable.    Houston Siren 11/03/2021, 4:07 PM

## 2021-11-03 NOTE — Consult Note (Signed)
Reason for Consult:assistance with diuresis in pt with CKD stage IV Referring Physician: Nori Riis, MD  Tanya Koch is an 88 y.o. female with Koch PMH significant for HTN, COPD, CHF, CAD, chronic diastolic CHF (Grade 1 DD), dementia, GERD, FTT, and CKD stage IV who presented to [facility] ED via EMS from her memory care facility with acute onset of SOB and tachycardia.  In the ED, VSS, labs notable for K 5.6, BUN 44, Cr 2.79, Co2 21, WBC 8.1, Hgb 10.5, BNP 2,876.4.  ECK with atrial fibrillation and RVR with HR of 132.  CXR with patchy RLL airspace opacity and small right pleural effusion. Possible pneumonia or asymmetric edema.  Venous duplex negative for DVT.  We were consulted to assist with diuresis given her advanced CKD stage IV.  She is followed by palliative care as an outpatient.  It was noted that she has had some dysphagia and vomiting of food at the memory care facility.  She was recently admitted 7/13-7/23/23 at [facility] s/p fall and left hip fracture s/p left hemiarthroplasty.  That hospitalization was complicated by ABLA and SVT as well as mild AKI/CKD.  No family was in the room so the HPI was obtained by review of the EMR.  It does not appear that she has ever seen Nephrology despite advanced CKD stage IV-V.  She currently denies any SOB, chest pain, N/V/D.  Does not know where she is or why she is here.   Trend in Creatinine: Creatinine, Ser  Date/Time Value Ref Range Status  11/03/2021 02:24 PM 2.59 (H) 0.44 - 1.00 mg/dL Final  11/03/2021 10:00 AM 2.79 (H) 0.44 - 1.00 mg/dL Final  09/22/2021 12:34 PM 2.18 (H) 0.44 - 1.00 mg/dL Final  09/18/2021 03:08 AM 2.19 (H) 0.44 - 1.00 mg/dL Final  09/17/2021 03:30 AM 2.17 (H) 0.44 - 1.00 mg/dL Final  09/16/2021 04:59 AM 2.28 (H) 0.44 - 1.00 mg/dL Final  09/15/2021 03:41 AM 2.32 (H) 0.44 - 1.00 mg/dL Final  09/14/2021 01:58 AM 2.85 (H) 0.44 - 1.00 mg/dL Final  09/13/2021 02:36 AM 2.47 (H) 0.44 - 1.00 mg/dL Final  09/12/2021 01:42 AM 2.56 (H) 0.44 - 1.00  mg/dL Final  09/11/2021 02:28 AM 2.65 (H) 0.44 - 1.00 mg/dL Final  09/10/2021 01:41 AM 2.46 (H) 0.44 - 1.00 mg/dL Final  09/09/2021 06:29 PM 2.38 (H) 0.44 - 1.00 mg/dL Final  09/09/2021 04:35 AM 2.13 (H) 0.44 - 1.00 mg/dL Final  09/08/2021 11:06 AM 2.44 (H) 0.44 - 1.00 mg/dL Final  04/06/2021 11:02 AM 2.02 (H) 0.44 - 1.00 mg/dL Final  02/10/2021 02:15 PM 1.59 (H) 0.44 - 1.00 mg/dL Final  12/07/2020 04:36 AM 2.26 (H) 0.44 - 1.00 mg/dL Final  12/06/2020 03:19 AM 2.53 (H) 0.44 - 1.00 mg/dL Final  12/05/2020 02:08 PM 2.42 (H) 0.44 - 1.00 mg/dL Final  12/04/2020 09:08 PM 2.55 (H) 0.44 - 1.00 mg/dL Final  07/30/2020 09:42 AM 2.43 (H) 0.44 - 1.00 mg/dL Final    PMH:   Past Medical History:  Diagnosis Date   Bradycardia    CHF (congestive heart failure) ([place])    Closed right hip fracture, sequela 02/10/2021   COVID 12/06/2020   Emphysema, unspecified ([place])    Hematemesis 12/05/2020   History of MI (myocardial infarction)    Hypertension     PSH:   Past Surgical History:  Procedure Laterality Date   HIP ARTHROPLASTY Right    TOTAL HIP ARTHROPLASTY Left 09/09/2021   Procedure: LEFT HIP HEMI ARTHROPLASTY;  Surgeon: Leandrew Koyanagi,  MD;  Location: [place];  Service: Orthopedics;  Laterality: Left;    Allergies: No Known Allergies  Medications:   Prior to Admission medications   Medication Sig Start Date End Date Taking? Authorizing Provider  acetaminophen (TYLENOL) 325 MG tablet Take 650 mg by mouth every 6 (six) hours as needed for moderate pain.   Yes [provider]  ALPRAZolam (XANAX) 0.25 MG tablet Take 0.25 mg by mouth 2 (two) times daily as needed for anxiety (aggitation). 11/01/21  Yes [provider]  alum & mag hydroxide-simeth (MAALOX/MYLANTA) 200-200-20 MG/5ML suspension Take 30 mLs by mouth every 4 (four) hours as needed for indigestion or heartburn.   Yes [provider]  aspirin 81 MG chewable tablet Chew 81 mg by mouth daily.   Yes [provider]  calcium elemental as carbonate (BARIATRIC TUMS ULTRA) 400 MG chewable tablet Chew 1,000 mg by mouth daily.   Yes [provider]  diphenhydrAMINE (BENADRYL) 25 MG tablet Take 0.5 tablets (12.5 mg total) by mouth every 6 (six) hours as needed. Patient taking differently: Take 12.5 mg by mouth every 6 (six) hours as needed (reason not listed on MAR). 09/22/21  Yes Jacqlyn Larsen, PA-C  docusate sodium (COLACE) 100 MG capsule Take 100 mg by mouth 2 (two) times daily.   Yes [provider]  escitalopram (LEXAPRO) 10 MG tablet Take 1 tablet (10 mg total) by mouth daily. 09/18/21  Yes Nita Sells, MD  feeding supplement (ENSURE ENLIVE / ENSURE PLUS) LIQD Take 237 mLs by mouth 2 (two) times daily between meals. 09/13/21  Yes Antonieta Pert, MD  ferrous sulfate 325 (65 FE) MG tablet Take 325 mg by mouth daily with breakfast.   Yes [provider]  folic acid (FOLVITE) 1 MG tablet Take 1 mg by mouth daily. 08/04/21  Yes [provider]  HYDROcodone-acetaminophen (NORCO) 5-325 MG tablet Take 1 tablet by mouth every 6 (six) hours as needed. Patient taking differently: Take 1 tablet by mouth every 6 (six) hours as needed (pain). 09/18/21  Yes Nita Sells, MD  magnesium oxide (MAG-OX) 400 (240 Mg) MG tablet Take 400 mg by mouth 2 (two) times daily. 07/30/20  Yes [provider]  metoprolol tartrate (LOPRESSOR) 25 MG tablet Take 0.5 tablets (12.5 mg total) by mouth 2 (two) times daily. 09/18/21  Yes Nita Sells, MD  Mouthwashes (MOUTH RINSE) LIQD solution 15 mLs by Mouth Rinse route as needed (reason not listed on MAR).   Yes [provider]  ondansetron (ZOFRAN) 4 MG tablet Take 1 tablet (4 mg total) by mouth every 8 (eight) hours as needed for nausea or vomiting. 09/18/21  Yes Nita Sells, MD  pantoprazole (PROTONIX) 40 MG tablet Take 40 mg by mouth daily. 07/08/20  Yes [provider]  polyethylene glycol  (MIRALAX / GLYCOLAX) 17 g packet Take 17 g by mouth daily as needed for mild constipation. 09/13/21  Yes Antonieta Pert, MD  PRESCRIPTION MEDICATION Take 3 mg by mouth. Menthol-Cetylpyridium lozenge   Yes [provider]  rosuvastatin (CRESTOR) 10 MG tablet Take 10 mg by mouth at bedtime. 06/20/20  Yes [provider]  vitamin B-12 (CYANOCOBALAMIN) 1000 MCG tablet Take 2,000 mcg by mouth daily.   Yes [provider]  Zinc 30 MG TABS Take 30 mg by mouth daily.   Yes [provider]  calcium carbonate (TUMS - DOSED IN MG ELEMENTAL CALCIUM) 500 MG chewable tablet Chew 1,000 mg by mouth daily. Patient not taking: Reported  on 11/03/2021    [provider]    Inpatient medications:  aspirin  81 mg Oral Daily   calcium carbonate  1,000 mg Oral Daily   docusate sodium  100 mg Oral BID   escitalopram  10 mg Oral Daily   heparin  5,000 Units Subcutaneous Q8H   metoprolol tartrate  12.5 mg Oral BID   pantoprazole  40 mg Oral Daily   rosuvastatin  10 mg Oral QHS   sodium chloride flush  3 mL Intravenous Q12H    Discontinued Meds:   Medications Discontinued During This Encounter  Medication Reason   Ensure (ENSURE) Duplicate    Social History:  reports that she has quit smoking. Her smoking use included cigarettes. She has never been exposed to tobacco smoke. She has never used smokeless tobacco. She reports that she does not currently use alcohol. She reports that she does not use drugs.  Family History:   Family History  Problem Relation Age of Onset   Hypertension Other     Pertinent items are noted in HPI. Weight change:  No intake or output data in the 24 hours ending 11/03/21 1550 BP (!) 147/129   Pulse (!) 132   Temp 98.3 F (36.8 C) (Oral)   Resp 20   Ht 5\' 5"  (1.651 m)   Wt 68.5 kg   SpO2 97%   BMI 25.13 kg/m  Vitals:   11/03/21 1000 11/03/21 1040 11/03/21 1138 11/03/21 1419  BP: 93/73  (!) 144/100 (!) 147/129  Pulse: (!) 115 (!) 107  (!) 106 (!) 132  Resp: (!) 24 (!) 29 20   Temp:   98.3 F (36.8 C)   TempSrc:   Oral   SpO2: 94% 96% 97%   Weight:      Height:         General appearance: fatigued, no distress, slowed mentation, and frail Head: Normocephalic, without obvious abnormality, atraumatic Resp: rales bibasilar and R>L and wheezes bilaterally Cardio: irregularly irregular rhythm, no rub, and tachycardic GI: soft, non-tender; bowel sounds normal; no masses,  no organomegaly Extremities: edema 1+ on the right, 2 + on the left  Labs: Basic Metabolic Panel: Recent Labs  Lab 11/03/21 1000 11/03/21 1424  NA 140 141  K 5.6* 5.5*  CL 108 111  CO2 21* 19*  GLUCOSE 119* 118*  BUN 44* 44*  CREATININE 2.79* 2.59*  CALCIUM 8.6* 8.3*   Liver Function Tests: No results for input(s): "AST", "ALT", "ALKPHOS", "BILITOT", "PROT", "ALBUMIN" in the last 168 hours. No results for input(s): "LIPASE", "AMYLASE" in the last 168 hours. No results for input(s): "AMMONIA" in the last 168 hours. CBC: Recent Labs  Lab 11/03/21 1000  WBC 8.1  HGB 10.5*  HCT 32.7*  MCV 101.2*  PLT 201   PT/INR: @LABRCNTIP (inr:5) Cardiac Enzymes: )No results for input(s): "CKTOTAL", "CKMB", "CKMBINDEX", "TROPONINI" in the last 168 hours. CBG: No results for input(s): "GLUCAP" in the last 168 hours.  Iron Studies: No results for input(s): "IRON", "TIBC", "TRANSFERRIN", "FERRITIN" in the last 168 hours.  Xrays/Other Studies: VAS [place] LOWER EXTREMITY VENOUS (DVT) (ONLY [facility] & [facility])  Result Date: 11/03/2021  Lower Venous DVT Study Patient Name:  ARACELIE ADDIS  Date of Exam:   11/03/2021 Medical Rec #: [id number]       Accession #:    [id number] Date of Birth: 10-Sep-1928       Patient Gender: F Patient Age:   31 years Exam Location:  [facility] Procedure:  VAS [place] LOWER EXTREMITY VENOUS (DVT) Referring Phys: Marda Stalker --------------------------------------------------------------------------------  Indications: Swelling, and  Edema.  Comparison Study: no prior Performing Technologist: Archie Patten RVS  Examination Guidelines: Koch complete evaluation includes B-mode imaging, spectral Doppler, color Doppler, and power Doppler as needed of all accessible portions of each vessel. Bilateral testing is considered an integral part of Koch complete examination. Limited examinations for reoccurring indications may be performed as noted. The reflux portion of the exam is performed with the patient in reverse Trendelenburg.  +-----+---------------+---------+-----------+----------+--------------+ RIGHTCompressibilityPhasicitySpontaneityPropertiesThrombus Aging +-----+---------------+---------+-----------+----------+--------------+ CFV  Full           Yes      Yes                                 +-----+---------------+---------+-----------+----------+--------------+   +---------+---------------+---------+-----------+----------+--------------+ LEFT     CompressibilityPhasicitySpontaneityPropertiesThrombus Aging +---------+---------------+---------+-----------+----------+--------------+ CFV      Full           Yes      Yes                                 +---------+---------------+---------+-----------+----------+--------------+ SFJ      Full                                                        +---------+---------------+---------+-----------+----------+--------------+ FV Prox  Full                                                        +---------+---------------+---------+-----------+----------+--------------+ FV Mid   Full                                                        +---------+---------------+---------+-----------+----------+--------------+ FV DistalFull                                                        +---------+---------------+---------+-----------+----------+--------------+ PFV      Full                                                         +---------+---------------+---------+-----------+----------+--------------+ POP      Full           Yes      Yes                                 +---------+---------------+---------+-----------+----------+--------------+ PTV      Full                                                        +---------+---------------+---------+-----------+----------+--------------+  PERO     Full                                                        +---------+---------------+---------+-----------+----------+--------------+     Summary: RIGHT: - No evidence of common femoral vein obstruction.  LEFT: - There is no evidence of deep vein thrombosis in the lower extremity.  - No cystic structure found in the popliteal fossa.  *See table(s) above for measurements and observations.    Preliminary    DG Chest 2 View  Result Date: 11/03/2021 CLINICAL DATA:  Shortness of breath, wheezing EXAM: CHEST - 2 VIEW COMPARISON:  09/08/2021 FINDINGS: Stable cardiomegaly. Aortic atherosclerosis. Patchy right lower lobe airspace opacity. Small right pleural effusion. Mild left basilar atelectasis. No pneumothorax. IMPRESSION: Patchy right lower lobe airspace opacity, could represent pneumonia or asymmetric edema. Small right pleural effusion. Electronically Signed   By: Davina Poke D.O.   On: 11/03/2021 10:36     Assessment/Plan:  Atrial fibrillation with RVR.  Improved rate with bolus of IV NS.  Continue with asa and metoprolol per primary. Acute systolic CHF - likely due to Koch fib with RVR.  ECHO in January 2023 with grade 1 DD but preserved EF.  Has infiltrate of RLL with pleural effusion.  Ok to give 1 dose of Furosemide 40 mg IV and follow UOP.  Lower ext duplex negative for DVT.   CKD stage IV - baseline appears to be 2.1-2.5.  Not much off baseline.  Continue to follow after IV lasix.  Pt is not Koch candidate for dialysis given her advanced age, multiple co-morbidities, and poor functional and nutritional status.   Palliative care is following as an outpatient. Hyperkalemia - likely due to mild AKI in setting of Afib with RVR.  Ok to give Lokelma 10 grams and follow. Anemia of CKD stage IV - stable and follow H/H.  Dysphagia - per primary COPD - continue to monitor oxygen sats Dementia - peer primary.   Tanya Koch Samar Tanya Koch 11/03/2021, 3:50 PM

## 2021-11-03 NOTE — Assessment & Plan Note (Addendum)
6 month history of vomiting up food. No known aspiration event. CXR showed right lower lobe opacity. Non-infectious presentation; afebrile, WBC 8.1.  - Speech consulted for swallow study, recommend regular, thin liquid diet

## 2021-11-03 NOTE — Assessment & Plan Note (Signed)
Chronic, presented from memory care unit  - Plan to return to memory care on discharge  - On Xanax and Lexapro for anxiety

## 2021-11-03 NOTE — Progress Notes (Signed)
Lower extremity venous has been completed.   Preliminary results in CV Proc.   Tanya Koch 11/03/2021 10:56 AM

## 2021-11-03 NOTE — ED Provider Notes (Signed)
[place] EMERGENCY DEPARTMENT Provider Note   CSN: [id number] Arrival date & time: 11/03/21  0867     History  Chief Complaint  Patient presents with   Shortness of Breath   Atrial Fibrillation    Tanya Koch is a 88 y.o. female.  88 year old female transported via EMS to emergency department for evaluation of shortness of breath. Patient with a past medical history of CHF, hypertension, A-fib, Dementia COPD, emphysema or transported via EMS to emergency department with complaints of shortness of breath.[facility] contacted 911 for pt being acutely SOB.  Patient denies chest pain, palpitations, headache, dizziness, nausea, vomiting or abdominal pain.  Denies urinary Sx: pain, burning or urinary frequency. During evaluation patient denied shortness of breath.  EKG done by the EMS demonstrated A-fib with heart rate in 120's..  Patient was given 500 bolus IV fluid which she tolerated well.  Patient is pleasant, smiling, alert and oriented. She is comfortably communicating without any visible distress.  She is slightly hypotensive and tachycardic upon arrival. She has bibasilar coarse crackles with slight diminished breath sounds in the right lower lobe.  Heart rate is irregular without murmur or gallop.  No JVD.  Abdomen soft, nontender.  1+ pitting edema on the left.  No calf pain, redness appreciated.  Pedal pulses intact bilaterally.  Patient had recent hip surgery. She does not ambulate, use wheelchair to get around. With her recent hip surgery, shortness of breath, tachycardia, bibasilar crackles with lower extremity edema is concerning for possible DVT/PE, pneumonia, CHF or COPD exacerbation.   The history is provided by a relative (daughter in law and patient). No language interpreter was used.  Shortness of Breath Associated symptoms: no chest pain   Atrial Fibrillation Associated symptoms include shortness of breath. Pertinent negatives include no chest  pain.       Home Medications Prior to Admission medications   Medication Sig Start Date End Date Taking? Authorizing Provider  acetaminophen (TYLENOL) 325 MG tablet Take 650 mg by mouth every 6 (six) hours as needed for moderate pain.   Yes [provider]  ALPRAZolam (XANAX) 0.25 MG tablet Take 0.25 mg by mouth 2 (two) times daily as needed for anxiety (aggitation). 11/01/21  Yes [provider]  alum & mag hydroxide-simeth (MAALOX/MYLANTA) 200-200-20 MG/5ML suspension Take 30 mLs by mouth every 4 (four) hours as needed for indigestion or heartburn.   Yes [provider]  aspirin 81 MG chewable tablet Chew 81 mg by mouth daily.   Yes [provider]  calcium elemental as carbonate (BARIATRIC TUMS ULTRA) 400 MG chewable tablet Chew 1,000 mg by mouth daily.   Yes [provider]  diphenhydrAMINE (BENADRYL) 25 MG tablet Take 0.5 tablets (12.5 mg total) by mouth every 6 (six) hours as needed. Patient taking differently: Take 12.5 mg by mouth every 6 (six) hours as needed (reason not listed on MAR). 09/22/21  Yes Jacqlyn Larsen, PA-C  docusate sodium (COLACE) 100 MG capsule Take 100 mg by mouth 2 (two) times daily.   Yes [provider]  escitalopram (LEXAPRO) 10 MG tablet Take 1 tablet (10 mg total) by mouth daily. 09/18/21  Yes Nita Sells, MD  feeding supplement (ENSURE ENLIVE / ENSURE PLUS) LIQD Take 237 mLs by mouth 2 (two) times daily between meals. 09/13/21  Yes Antonieta Pert, MD  ferrous sulfate 325 (65 FE) MG tablet Take 325 mg by mouth daily with breakfast.   Yes [provider]  folic acid (FOLVITE)  1 MG tablet Take 1 mg by mouth daily. 08/04/21  Yes [provider]  HYDROcodone-acetaminophen (NORCO) 5-325 MG tablet Take 1 tablet by mouth every 6 (six) hours as needed. Patient taking differently: Take 1 tablet by mouth every 6 (six) hours as needed (pain). 09/18/21  Yes Nita Sells, MD  magnesium oxide  (MAG-OX) 400 (240 Mg) MG tablet Take 400 mg by mouth 2 (two) times daily. 07/30/20  Yes [provider]  metoprolol tartrate (LOPRESSOR) 25 MG tablet Take 0.5 tablets (12.5 mg total) by mouth 2 (two) times daily. 09/18/21  Yes Nita Sells, MD  Mouthwashes (MOUTH RINSE) LIQD solution 15 mLs by Mouth Rinse route as needed (reason not listed on MAR).   Yes [provider]  ondansetron (ZOFRAN) 4 MG tablet Take 1 tablet (4 mg total) by mouth every 8 (eight) hours as needed for nausea or vomiting. 09/18/21  Yes Nita Sells, MD  pantoprazole (PROTONIX) 40 MG tablet Take 40 mg by mouth daily. 07/08/20  Yes [provider]  polyethylene glycol (MIRALAX / GLYCOLAX) 17 g packet Take 17 g by mouth daily as needed for mild constipation. 09/13/21  Yes Antonieta Pert, MD  PRESCRIPTION MEDICATION Take 3 mg by mouth. Menthol-Cetylpyridium lozenge   Yes [provider]  rosuvastatin (CRESTOR) 10 MG tablet Take 10 mg by mouth at bedtime. 06/20/20  Yes [provider]  vitamin B-12 (CYANOCOBALAMIN) 1000 MCG tablet Take 2,000 mcg by mouth daily.   Yes [provider]  Zinc 30 MG TABS Take 30 mg by mouth daily.   Yes [provider]  calcium carbonate (TUMS - DOSED IN MG ELEMENTAL CALCIUM) 500 MG chewable tablet Chew 1,000 mg by mouth daily. Patient not taking: Reported on 11/03/2021    [provider]      Allergies    Patient has no known allergies.    Review of Systems   Review of Systems  Respiratory:  Positive for shortness of breath. Negative for chest tightness.   Cardiovascular:  Positive for leg swelling. Negative for chest pain and palpitations.  Neurological:  Negative for dizziness.    Physical Exam Updated Vital Signs BP (!) 144/100   Pulse (!) 106   Temp 98.3 F (36.8 C) (Oral)   Resp 20   Ht 5\' 5"  (1.651 m)   Wt 68.5 kg   SpO2 97%   BMI 25.13 kg/m  Physical Exam Vitals and nursing note reviewed.   Constitutional:      General: She is not in acute distress.    Appearance: She is well-developed. She is not ill-appearing.  HENT:     Head: Normocephalic and atraumatic.  Cardiovascular:     Rate and Rhythm: Tachycardia present. Rhythm irregular.     Heart sounds: No murmur heard.    No gallop.  Pulmonary:     Effort: Pulmonary effort is normal. No tachypnea.     Breath sounds: Examination of the right-lower field reveals decreased breath sounds and rales. Examination of the left-lower field reveals rales. Decreased breath sounds and rales present.  Chest:     Chest wall: No tenderness.  Abdominal:     Palpations: Abdomen is soft.     Tenderness: There is no abdominal tenderness. There is no guarding.  Musculoskeletal:     Cervical back: Normal range of motion.     Right lower leg: No tenderness. No edema.     Left lower leg: No tenderness. Edema (1+ pitting edema.) present.  Skin:  General: Skin is warm.     Capillary Refill: Capillary refill takes less than 2 seconds.     Findings: No erythema or rash.  Neurological:     Mental Status: She is alert. She is disoriented.     Motor: No weakness.  Psychiatric:        Mood and Affect: Mood normal.     ED Results / Procedures / Treatments   Labs (all labs ordered are listed, but only abnormal results are displayed) Labs Reviewed  BASIC METABOLIC PANEL - Abnormal; Notable for the following components:      Result Value   Potassium 5.6 (*)    CO2 21 (*)    Glucose, Bld 119 (*)    BUN 44 (*)    Creatinine, Ser 2.79 (*)    Calcium 8.6 (*)    GFR, Estimated 16 (*)    All other components within normal limits  CBC - Abnormal; Notable for the following components:   RBC 3.23 (*)    Hemoglobin 10.5 (*)    HCT 32.7 (*)    MCV 101.2 (*)    RDW 20.2 (*)    All other components within normal limits  BRAIN NATRIURETIC PEPTIDE - Abnormal; Notable for the following components:   B Natriuretic Peptide 2,876.4 (*)    All other  components within normal limits  TROPONIN I (HIGH SENSITIVITY) - Abnormal; Notable for the following components:   Troponin I (High Sensitivity) 44 (*)    All other components within normal limits  MAGNESIUM  BASIC METABOLIC PANEL  TROPONIN I (HIGH SENSITIVITY)    EKG EKG Interpretation  Date/Time:  Thursday November 03 2021 09:55:23 EDT Ventricular Rate:  124 PR Interval:    QRS Duration: 141 QT Interval:  358 QTC Calculation: 515 R Axis:   191 Text Interpretation: Atrial fibrillation Probable lateral infarct, age indeterminate Anterior infarct, old Prolonged QT interval Baseline wander in lead(s) V4 Partial missing lead(s): V4 when compared to prior, faster rate but similar afib. No STEMI Confirmed by Antony Blackbird [phone]) on 11/03/2021 9:58:05 AM  Radiology VAS [place] LOWER EXTREMITY VENOUS (DVT) (ONLY [facility] & [facility])  Result Date: 11/03/2021  Lower Venous DVT Study Patient Name:  Tanya Koch  Date of Exam:   11/03/2021 Medical Rec #: [id number]       Accession #:    [id number] Date of Birth: 10/20/1928       Patient Gender: F Patient Age:   63 years Exam Location:  [facility] Procedure:      VAS [place] LOWER EXTREMITY VENOUS (DVT) Referring Phys: Marda Stalker --------------------------------------------------------------------------------  Indications: Swelling, and Edema.  Comparison Study: no prior Performing Technologist: Archie Patten RVS  Examination Guidelines: A complete evaluation includes B-mode imaging, spectral Doppler, color Doppler, and power Doppler as needed of all accessible portions of each vessel. Bilateral testing is considered an integral part of a complete examination. Limited examinations for reoccurring indications may be performed as noted. The reflux portion of the exam is performed with the patient in reverse Trendelenburg.  +-----+---------------+---------+-----------+----------+--------------+ RIGHTCompressibilityPhasicitySpontaneityPropertiesThrombus  Aging +-----+---------------+---------+-----------+----------+--------------+ CFV  Full           Yes      Yes                                 +-----+---------------+---------+-----------+----------+--------------+   +---------+---------------+---------+-----------+----------+--------------+ LEFT     CompressibilityPhasicitySpontaneityPropertiesThrombus Aging +---------+---------------+---------+-----------+----------+--------------+ CFV      Full  Yes      Yes                                 +---------+---------------+---------+-----------+----------+--------------+ SFJ      Full                                                        +---------+---------------+---------+-----------+----------+--------------+ FV Prox  Full                                                        +---------+---------------+---------+-----------+----------+--------------+ FV Mid   Full                                                        +---------+---------------+---------+-----------+----------+--------------+ FV DistalFull                                                        +---------+---------------+---------+-----------+----------+--------------+ PFV      Full                                                        +---------+---------------+---------+-----------+----------+--------------+ POP      Full           Yes      Yes                                 +---------+---------------+---------+-----------+----------+--------------+ PTV      Full                                                        +---------+---------------+---------+-----------+----------+--------------+ PERO     Full                                                        +---------+---------------+---------+-----------+----------+--------------+     Summary: RIGHT: - No evidence of common femoral vein obstruction.  LEFT: - There is no evidence of deep vein thrombosis in  the lower extremity.  - No cystic structure found in the popliteal fossa.  *See table(s) above for measurements and observations.    Preliminary    DG Chest 2 View  Result Date: 11/03/2021 CLINICAL DATA:  Shortness of breath, wheezing EXAM: CHEST -  2 VIEW COMPARISON:  09/08/2021 FINDINGS: Stable cardiomegaly. Aortic atherosclerosis. Patchy right lower lobe airspace opacity. Small right pleural effusion. Mild left basilar atelectasis. No pneumothorax. IMPRESSION: Patchy right lower lobe airspace opacity, could represent pneumonia or asymmetric edema. Small right pleural effusion. Electronically Signed   By: Davina Poke D.O.   On: 11/03/2021 10:36    Procedures Procedures    Medications Ordered in ED Medications  sodium chloride flush (NS) 0.9 % injection 3 mL (3 mLs Intravenous Given 11/03/21 1347)  sodium chloride flush (NS) 0.9 % injection 3 mL (has no administration in time range)  0.9 %  sodium chloride infusion (has no administration in time range)  acetaminophen (TYLENOL) tablet 650 mg (has no administration in time range)  ondansetron (ZOFRAN) injection 4 mg (has no administration in time range)  heparin injection 5,000 Units (has no administration in time range)  aspirin chewable tablet 81 mg (has no administration in time range)  escitalopram (LEXAPRO) tablet 10 mg (has no administration in time range)  docusate sodium (COLACE) capsule 100 mg (has no administration in time range)  metoprolol tartrate (LOPRESSOR) tablet 12.5 mg (has no administration in time range)  ALPRAZolam (XANAX) tablet 0.25 mg (has no administration in time range)  calcium carbonate (TUMS - dosed in mg elemental calcium) chewable tablet 1,000 mg (has no administration in time range)  pantoprazole (PROTONIX) EC tablet 40 mg (has no administration in time range)  rosuvastatin (CRESTOR) tablet 10 mg (has no administration in time range)  furosemide (LASIX) injection 40 mg (has no administration in time range)     ED Course/ Medical Decision Making/ A&P                           Medical Decision Making This is a 88 year old female with H/O CHF, HTN, CKD, hyperlipidemia dementia, presented to emergency department for evaluation of shortness of breath. Patient [facility] for evaluation of acute onset of shortness of breath.  Upon arrival patient is alert and oriented.  Comfortably communicating without visible distress.  Patient denies chest pain, palpitations, dizziness, shortness of breath, abdominal pain, nausea, vomiting.  She denies any urinary symptoms. Pt relocated from [place] last year. She saw [facility] Cardiologist once.   EKG done by EMS demonstrated patient in A-fib with heart rate in 120's.  Patient was slightly tachypneic and hypotensive.  Patient received 500 mg bolus IV fluid.  On Exam: Skin color and turgor normal.  Heart Rate Irregular without murmur or gallops.  No JVD. Bibasilar coarse crackles with diminished breath sounds in right lower lobe.  No increased effort of breathing.  No cough noted while I was in the exam room. No hypoxia. Abdomen soft, nontender.  +1 pitting edema on the left.  No calf pain, redness appreciated.  Pulses 2+ bilaterally.  Considering patient's recent hip surgery, shortness of breath, legs swelling concerning for possible DVT?? PE??.Other differential diagnosis Includes: Acute CHF or COPD exacerbation, pneumonia, anemia, Viral LRTI.  Low GFR therefore CT angio was not an option.   CXR: Patchy right lower lobe airspace opacity, could represent pneumonia or asymmetric edema. Small right pleural effusion. Doppler US: Negative for DVT SOB could be from anemia however her HB and HCT looks better than her previous numbers. Labs: Initial trop elevated. Await for second reading BNP: Slightly elevated K and worsening of kidney function. BMP: 2876   I spoke with daughter in law and explained the  physical findings and the lab  results. She has evaluated BMP, abnormal  CXR findings of Rt pleural effusion and Rt patchy infiltrate  and worsening of renal function consistent with Acute CHF exacerbation, pneumonia and AKI respectively. Plan is to admit the patient for further evaluation and management of Ac. CHF exacerbation.   Report given to admitting team by Dr Tegeler for the continuity of care.    Amount and/or Complexity of Data Reviewed Labs: ordered. Radiology: ordered.          Final Clinical Impression(s) / ED Diagnoses Final diagnoses:  Shortness of breath  Elevated brain natriuretic peptide (BNP) level  AKI (acute kidney injury) ([place])  Edema, unspecified type    Rx / DC Orders ED Discharge Orders     None         Teola Bradley, MD 11/03/21 1416    Tegeler, Gwenyth Allegra, MD 11/05/21 2139

## 2021-11-03 NOTE — ED Notes (Signed)
Patient in NAD.  Respirations even and unlabored, call bell within reach, side rails up x2.

## 2021-11-03 NOTE — ED Notes (Signed)
Patient does have hx of afib dx in july

## 2021-11-04 DIAGNOSIS — F0390 Unspecified dementia without behavioral disturbance: Secondary | ICD-10-CM | POA: Diagnosis not present

## 2021-11-04 DIAGNOSIS — I5043 Acute on chronic combined systolic (congestive) and diastolic (congestive) heart failure: Secondary | ICD-10-CM | POA: Diagnosis not present

## 2021-11-04 DIAGNOSIS — I4891 Unspecified atrial fibrillation: Secondary | ICD-10-CM | POA: Diagnosis not present

## 2021-11-04 DIAGNOSIS — N184 Chronic kidney disease, stage 4 (severe): Secondary | ICD-10-CM | POA: Diagnosis not present

## 2021-11-04 DIAGNOSIS — F419 Anxiety disorder, unspecified: Secondary | ICD-10-CM

## 2021-11-04 LAB — BASIC METABOLIC PANEL
Anion gap: 12 (ref 5–15)
BUN: 52 mg/dL — ABNORMAL HIGH (ref 8–23)
CO2: 20 mmol/L — ABNORMAL LOW (ref 22–32)
Calcium: 8.6 mg/dL — ABNORMAL LOW (ref 8.9–10.3)
Chloride: 110 mmol/L (ref 98–111)
Creatinine, Ser: 3.02 mg/dL — ABNORMAL HIGH (ref 0.44–1.00)
GFR, Estimated: 14 mL/min — ABNORMAL LOW (ref >60)
Glucose, Bld: 93 mg/dL (ref 70–99)
Potassium: 5.1 mmol/L (ref 3.5–5.1)
Sodium: 142 mmol/L (ref 135–145)

## 2021-11-04 MED ORDER — FUROSEMIDE 20 MG PO TABS: 20.0000 mg | ORAL_TABLET | ORAL | 1 refills | Status: DC | PRN

## 2021-11-04 MED ORDER — FUROSEMIDE 20 MG PO TABS: 20.0000 mg | ORAL_TABLET | ORAL | 1 refills | Status: DC

## 2021-11-04 MED ORDER — ALPRAZOLAM 0.25 MG PO TABS: 0.2500 mg | ORAL_TABLET | Freq: Every day | ORAL | Status: DC | PRN

## 2021-11-04 MED ORDER — ORAL CARE MOUTH RINSE: 15.0000 mL | OROMUCOSAL | Status: DC | PRN

## 2021-11-04 MED ORDER — ALPRAZOLAM 0.25 MG PO TABS: 0.2500 mg | ORAL_TABLET | Freq: Every day | ORAL | Status: DC

## 2021-11-04 NOTE — Discharge Summary (Signed)
[facility] Discharge Summary  Patient name: Tanya Koch Medical record number: [id number] Date of birth: [birth year] Age: 88 y.o. Gender: female Date of Admission: 11/03/2021  Date of Discharge: 11/04/21 Admitting Physician: Erskine Emery, MD  Primary Care Provider: Earmon Phoenix, NP Consultants: None   Indication for Hospitalization: Shortness of breath   Brief Hospital Course:  Brief Hospital Course: Tanya Koch is a 88 y.o. female with a PMHx of CHF, HTN, Afib, dementia, COPD who presented with SOB and elevated HR via EMS from senior living. Her hospital course is outlined below:  * Acute exacerbation of CHF (congestive heart failure)  Patient presented with shortness of breath without hypoxia. CXR showed right lower lobe opacity. Patient was non-infectious appearing on examination and feel that the opacity may have been related to fluid overload. Physical exam and labs consistent with fluid overload/CHF exacerbation that is milder in nature in the setting of Afib with RVR. Patient was gently diuresed wth IV Lasix 40mg  once. Given her improvement after this dose, feel that it would be appropriate to have intermittent Lasix dosing at her facility with monitoring of volume and respiratory status.  Atrial Fibrillation Patient was initially in Afib with RVR, which likely contributed to volume overload. There was concern in the ER as patient was also slightly hypotensive and received a 500mg  bolus of IV fluids. She was continued on her home medications aspirin 81 mg and metoprolol 12.5 mg.  Dysphagia Family reports 6 month history of vomiting up soft food diet. No known aspiration event. Speech evaluated the patient and she was cleared for regular diet with mild aspiration risk.  Chronic kidney disease (CKD), stage IV (severe)  Patient had mildly elevated potassium on admission, but appeared that the blood samples were hemolyzed on the first 2 draws. Patient  received IV lasix for one dose as well as Lokelma for one dose. Potassium on repeat was within normal limits.  Discharge Diagnoses/Problem List:  Principal Problem for Admission: Acute CHF exacerbation Other Problems addressed during stay:  Anxiety    Issues for Follow Up:  Patient anxious about breathing but did well in the hospital, consider scheduling a night time dose with a daily PRN if continues to be anxious at night Started Lasix oral 20mg  every other day, continue to monitor volume status. Check BMP in 3-5 days to monitor creatinine and potassium.   Disposition: Memory care unit  Discharge Condition: Stable   Discharge Exam:  Vitals:   11/04/21 0529 11/04/21 0819  BP: (!) 113/96 (!) 152/108  Pulse: 92 88  Resp: 17 20  Temp: 97.9 F (36.6 C) (!) 97.4 F (36.3 C)  SpO2: 99% 98%   Physical Exam: General: Pleasantly demented, alert, non-toxic/non-infectious appearing, resting comfortably and alert Cardiovascular: Irregularly irregular rhythm, rate WNL, S1 S2 present. No rubs, murmurs, gallops.  Respiratory: Mild bibasilar crackles, normal WOB without accessory muscle use, chest rise and fall symmetric, on room air  Gastrointestinal: Abdomen soft, non-distended, not tender to palpation Extremities: 1+ pitting edema in bilateral lower extremities.    Significant Procedures: none   Significant Labs and Imaging:  Recent Labs  Lab 11/03/21 1000  WBC 8.1  HGB 10.5*  HCT 32.7*  PLT 201   Recent Labs  Lab 11/03/21 1000 11/03/21 1424 11/04/21 0543  NA 140 141 142  K 5.6* 5.5* 5.1  CL 108 111 110  CO2 21* 19* 20*  GLUCOSE 119* 118* 93  BUN 44* 44* 52*  CREATININE 2.79* 2.59* 3.02*  CALCIUM 8.6* 8.3* 8.6*  MG 2.3  --   --    No imaging   Results/Tests Pending at Time of Discharge: None   Discharge Medications:  Allergies as of 11/04/2021   No Known Allergies      Medication List     TAKE these medications    acetaminophen 325 MG tablet Commonly  known as: TYLENOL Take 650 mg by mouth every 6 (six) hours as needed for moderate pain.   ALPRAZolam 0.25 MG tablet Commonly known as: XANAX Take 0.25 mg by mouth 2 (two) times daily as needed for anxiety (aggitation).   alum & mag hydroxide-simeth 200-200-20 MG/5ML suspension Commonly known as: MAALOX/MYLANTA Take 30 mLs by mouth every 4 (four) hours as needed for indigestion or heartburn.   aspirin 81 MG chewable tablet Chew 81 mg by mouth daily.   calcium carbonate 500 MG chewable tablet Commonly known as: TUMS - dosed in mg elemental calcium Chew 1,000 mg by mouth daily.   calcium elemental as carbonate 400 MG chewable tablet Commonly known as: BARIATRIC TUMS ULTRA Chew 1,000 mg by mouth daily.   cyanocobalamin 1000 MCG tablet Commonly known as: VITAMIN B12 Take 2,000 mcg by mouth daily.   diphenhydrAMINE 25 MG tablet Commonly known as: BENADRYL Take 0.5 tablets (12.5 mg total) by mouth every 6 (six) hours as needed. What changed: reasons to take this   docusate sodium 100 MG capsule Commonly known as: COLACE Take 100 mg by mouth 2 (two) times daily.   escitalopram 10 MG tablet Commonly known as: LEXAPRO Take 1 tablet (10 mg total) by mouth daily.   feeding supplement Liqd Take 237 mLs by mouth 2 (two) times daily between meals.   ferrous sulfate 325 (65 FE) MG tablet Take 325 mg by mouth daily with breakfast.   folic acid 1 MG tablet Commonly known as: FOLVITE Take 1 mg by mouth daily.   furosemide 20 MG tablet Commonly known as: Lasix Take 1 tablet (20 mg total) by mouth every other day as needed for fluid or edema.   HYDROcodone-acetaminophen 5-325 MG tablet Commonly known as: Norco Take 1 tablet by mouth every 6 (six) hours as needed. What changed: reasons to take this   magnesium oxide 400 (240 Mg) MG tablet Commonly known as: MAG-OX Take 400 mg by mouth 2 (two) times daily.   metoprolol tartrate 25 MG tablet Commonly known as: LOPRESSOR Take  0.5 tablets (12.5 mg total) by mouth 2 (two) times daily.   mouth rinse Liqd solution 15 mLs by Mouth Rinse route as needed (reason not listed on MAR).   ondansetron 4 MG tablet Commonly known as: ZOFRAN Take 1 tablet (4 mg total) by mouth every 8 (eight) hours as needed for nausea or vomiting.   pantoprazole 40 MG tablet Commonly known as: PROTONIX Take 40 mg by mouth daily.   polyethylene glycol 17 g packet Commonly known as: MIRALAX / GLYCOLAX Take 17 g by mouth daily as needed for mild constipation.   PRESCRIPTION MEDICATION Take 3 mg by mouth. Menthol-Cetylpyridium lozenge   rosuvastatin 10 MG tablet Commonly known as: CRESTOR Take 10 mg by mouth at bedtime.   Zinc 30 MG Tabs Take 30 mg by mouth daily.        Discharge Instructions: Please refer to Patient Instructions section of EMR for full details.  Patient was counseled important signs and symptoms that should prompt return to medical care, changes in medications, dietary instructions, activity restrictions, and follow up appointments.  Follow-Up Appointments:   Rise Patience, DO 11/04/2021, 1:09 PM [place]

## 2021-11-04 NOTE — Progress Notes (Addendum)
Daily Progress Note Intern Pager: [phone]  Patient name: Tanya Koch Medical record number: [id number] Date of birth: 11/07/1928 Age: 88 y.o. Gender: female  Primary Care Provider: Earmon Phoenix, NP Consultants: Cardio Code Status: DNR/DNI  Pt Overview and Major Events to Date:  11/03/21 - admitted  Assessment and Plan: Tanya Koch is a 88 y.o. female presenting with likely CHF exacerbation and tachycardia.  * Acute exacerbation of CHF (congestive heart failure) ([place]) Presented with shortness of breath without true hypoxemia (saturating 95%+ on room air) likely 2/2 systolic CHF. Most recent Echo 03/03/21 showed EF 55-60%.   - IV Lasix 40 mg, curbsided nephro for assistance  - Strict I/Os and daily weights - Trend CBC, BMP  - continuous cardiac monitoring  - VS per floor protocol   Chronic kidney disease (CKD), stage IV (severe) (HCC) CKD stage 4. Cr trending up (baseline ~2.5), GFR 16 (21 in July) - K 5.6 > 5.1, consider 10 g Lokelma if elevates again - Avoid nephrotoxic agents - Trend BMP - Strict I/Os  Anxiety Patient continues to have intermittent anxiety attacks where she states that she cannot breathe. This seems to occur when she is left alone in a room.  - Xanax 0.25 mg once per day plus PRN - Continue Lexapro as well   Dysphagia 6 month history of vomiting up food. No known aspiration event. CXR showed right lower lobe opacity. Non-infectious presentation; afebrile, WBC 8.1.  - Speech consulted for swallow study, recommend regular, thin liquid diet   Atrial fibrillation (HCC) Chronic, followed by Heart Care.  - Admit to cardiac monitoring med tele, curbside Heart care  - Continue home Aspirin 81 mg  - Continue home Metoprolol 12.5 mg  Emphysema lung (HCC) 25 pack year smoking history, not on home oxygen or inhalers. Not a current smoker. - Monitor O2 status   Dementia without behavioral disturbance (HCC) Chronic, presented from memory care unit  -  Plan to return to memory care on discharge  - On Xanax and Lexapro for anxiety     Chronic: Crestor 10 mg for HLD, TUMS, Protonix 40 mg and Zofran 4 mg for chronic dysphagia/vomiting, Colace 100 mg for constipation, Xanax 0.25mg  and Lexapro 10 mg for anxiety, Tylenol PRN for pain.   FEN/GI: NPO until swallow study per speech  PPx: Heparin 5,000 units SQ Dispo: SNF memory care  pending clinical improvement . Barriers include fluid overload/diuresis.   Subjective:  Overnight she had anxiety episodes where she complained of SOB so they put her on 1L for comfort. Did not desaturate and is not currently requiring any O2. Patient was seen at bedside resting comfortably with normal work of breathing and symmetric chest rise and fall. On room air. ROS was limited due to pleasantly demented and auditory loss.   Objective: Temp:  [97.4 F (36.3 C)-99.8 F (37.7 C)] 97.4 F (36.3 C) (09/08 0819) Pulse Rate:  [31-132] 88 (09/08 0819) Resp:  [17-38] 20 (09/08 0819) BP: (113-164)/(87-129) 152/108 (09/08 0819) SpO2:  [97 %-100 %] 98 % (09/08 0819) Weight:  [61.1 kg] 61.1 kg (09/08 0000) Physical Exam: General: Pleasantly demented, alert, non-toxic/non-infectious appearing, resting comfortably and alert Cardiovascular: Irregularly irregular rhythm, rate WNL, S1 S2 present. No rubs, murmurs, gallops.  Respiratory: Mild bibasilar crackles, normal WOB without accessory muscle use, chest rise and fall symmetric, on room air  Gastrointestinal: Abdomen soft, non-distended, not tender to palpation Extremities: 1+ pitting edema in bilateral lower extremities.   Laboratory: Most recent CBC  Lab Results  Component Value Date   WBC 8.1 11/03/2021   HGB 10.5 (L) 11/03/2021   HCT 32.7 (L) 11/03/2021   MCV 101.2 (H) 11/03/2021   PLT 201 11/03/2021   Most recent BMP    Latest Ref Rng & Units 11/04/2021    5:43 AM  BMP  Glucose 70 - 99 mg/dL 93   BUN 8 - 23 mg/dL 52   Creatinine 0.44 - 1.00 mg/dL 3.02    Sodium 135 - 145 mmol/L 142   Potassium 3.5 - 5.1 mmol/L 5.1   Chloride 98 - 111 mmol/L 110   CO2 22 - 32 mmol/L 20   Calcium 8.9 - 10.3 mg/dL 8.6    None new  [name], Medical Student 11/04/2021, 11:29 AM [place] Intern pager: [phone], text pages welcome Secure chat group [facility] Teaching Service     Resident attestation: I agree with the documentation of Student Dr. [name] above. I have made adjustments to her note as appropriate. I have seen the patient and performed physical exam on the patient consistent with her documented physical exam above.  Erskine Emery, MD

## 2021-11-04 NOTE — TOC Initial Note (Addendum)
Transition of Care [facility]) - Initial/Assessment Note    Patient Details  Name: Tanya Koch MRN: [id number] Date of Birth: December 02, 1928  [facility]) CM/SW Contact:    Bethann Berkshire, [place] Phone Number: 11/04/2021, 12:35 PM  Clinical Narrative:      CSW is informed that pt may discharge today and return to [facility]. CSW called [facility] and confirmed pt is in their Memory Care. CSW provided update regarding potential DC today. CSW faxed preliminary FL2 and progress notes to [phone]. Will need to send finalized fl2 with DC meds as well as DC summary once available. TOC will continue to follow.               CSW notified pt son regarding pt's discharge. Pt will need PTAR.   Expected Discharge Plan: Memory Care Barriers to Discharge: Continued Medical Work up   Patient Goals and CMS Choice        Expected Discharge Plan and Services Expected Discharge Plan: Memory Care       Living arrangements for the past 2 months:  (Memory care [facility])                                      Prior Living Arrangements/Services Living arrangements for the past 2 months:  (Memory care [employer]) Lives with:: Facility Resident                   Activities of Daily Living      Permission Sought/Granted                  Emotional Assessment              Admission diagnosis:  Shortness of breath [R06.02] CHF (congestive heart failure) (HCC) [I50.9] Acute exacerbation of CHF (congestive heart failure) ([place]) [I50.9] Elevated brain natriuretic peptide (BNP) level [R79.89] AKI (acute kidney injury) ([place]) [N17.9] Edema, unspecified type [R60.9] Patient Active Problem List   Diagnosis Date Noted   Anxiety 11/04/2021   Acute exacerbation of CHF (congestive heart failure) ([place]) 11/03/2021   CHF (congestive heart failure) ([place]) 11/03/2021   Atrial fibrillation ([place]) 11/03/2021   Dysphagia 11/03/2021   Closed left hip fracture, initial  encounter ([place]) 09/08/2021   Hypertension 09/08/2021   Emphysema lung ([place]) 09/08/2021   Hyperlipidemia 09/08/2021   Hypothermia 09/08/2021   Normocytic anemia 09/08/2021   Leukocytosis 09/08/2021   Closed right hip fracture, sequela 02/10/2021   Dementia without behavioral disturbance ([place]) 12/05/2020   GERD (gastroesophageal reflux disease) 12/05/2020   Chronic kidney disease (CKD), stage IV (severe) ([place]) 07/30/2020   PCP:  Earmon Phoenix, NP Pharmacy:  No Pharmacies Listed    Social Determinants of Health (SDOH) Interventions    Readmission Risk Interventions     No data to display

## 2021-11-04 NOTE — Progress Notes (Signed)
[facility] Progress Note  Name: Tanya Koch MRN: [id number] DOB: Mar 04, 1928   Subjective:  Strict ins/outs do not appear captured.  She had 100 mL uop as well as one unmeasured urine void over 9/7.  Per charting she is to be discharged back to her facility today.   Review of systems:  Pleasantly confused and unable to reliably provide  Denies any current shortness of breath  States that she likes the food here and is swallowing ok per her assessment Very hard of hearing ---------------------------- Background on referral:  Reason for Consult:assistance with diuresis in pt with CKD stage IV   Tanya Koch is an 88 y.o. female with a PMH significant for HTN, COPD, CHF, CAD, chronic diastolic CHF (Grade 1 DD), dementia, GERD, FTT, and CKD stage IV who presented to [facility] ED via EMS from her memory care facility with acute onset of SOB and tachycardia.  In the ED, VSS, labs notable for K 5.6, BUN 44, Cr 2.79, Co2 21, WBC 8.1, Hgb 10.5, BNP 2,876.4.  ECK with atrial fibrillation and RVR with HR of 132.  CXR with patchy RLL airspace opacity and small right pleural effusion. Possible pneumonia or asymmetric edema.  Venous duplex negative for DVT.  We were consulted to assist with diuresis given her advanced CKD stage IV.  She is followed by palliative care as an outpatient.  It was noted that she has had some dysphagia and vomiting of food at the memory care facility.  She was recently admitted 7/13-7/23/23 at [facility] s/p fall and left hip fracture s/p left hemiarthroplasty.  That hospitalization was complicated by ABLA and SVT as well as mild AKI/CKD.  No family was in the room so the HPI was obtained by review of the EMR.  It does not appear that she has ever seen Nephrology despite advanced CKD stage IV-V.  She currently denies any SOB, chest pain, N/V/D.  Does not know where she is or why she is here.    Intake/Output Summary (Last 24 hours) at 11/04/2021 1422 Last data filed at  11/04/2021 0800 Gross per 24 hour  Intake --  Output 250 ml  Net -250 ml    Vitals:  Vitals:   11/04/21 0000 11/04/21 0058 11/04/21 0529 11/04/21 0819  BP: (!) 119/98  (!) 113/96 (!) 152/108  Pulse: (!) 102  92 88  Resp: (!) 24  17 20   Temp: 97.9 F (36.6 C)  97.9 F (36.6 C) (!) 97.4 F (36.3 C)  TempSrc: Oral  Oral Oral  SpO2: 99% 98% 99% 98%  Weight: 61.1 kg     Height: 5\' 5"  (1.651 m)        Physical Exam:  General elderly female in bed, smiling; chronically ill appearing HEENT normocephalic atraumatic extraocular movements intact sclera anicteric Neck supple trachea midline Lungs clear to auscultation bilaterally normal work of breathing at rest  Heart S1S2 no rub Abdomen soft nontender nondistended Extremities trace edema  Psych normal mood and affect Neuro - pleasantly confused but conversant not sure of year or location; hard of hearing.  States she doesn't know year but then looks at wall calendar; smiles as she tells me that she is "cheating" and that it is 2023  Medications reviewed   Labs:     Latest Ref Rng & Units 11/04/2021    5:43 AM 11/03/2021    2:24 PM 11/03/2021   10:00 AM  BMP  Glucose 70 - 99 mg/dL 93  118  119  BUN 8 - 23 mg/dL 52  44  44   Creatinine 0.44 - 1.00 mg/dL 3.02  2.59  2.79   Sodium 135 - 145 mmol/L 142  141  140   Potassium 3.5 - 5.1 mmol/L 5.1  5.5  5.6   Chloride 98 - 111 mmol/L 110  111  108   CO2 22 - 32 mmol/L 20  19  21    Calcium 8.9 - 10.3 mg/dL 8.6  8.3  8.6      Assessment/Plan:    Atrial fibrillation with RVR.  Improved. Continue with asa and metoprolol per primary. Acute systolic CHF - likely due to a fib with RVR.  ECHO in January 2023 with grade 1 DD but preserved EF.  Has infiltrate of RLL with pleural effusion.  Lower ext duplex negative for DVT.   She got lasix 40 mg IV once on 9/7  Would schedule lasix 40 mg PO MWF.  Contacted team with recs for diuretics  CKD stage IV - baseline appears to be 2.1-2.5.  slightly  higher than her baseline now.  Pt is not a candidate for dialysis given her advanced age, multiple co-morbidities, and poor functional and nutritional status.  Palliative care is following as an outpatient. Hyperkalemia - likely due to mild AKI in setting of Afib with RVR. improved Anemia of CKD stage IV - Hb 10.5  Dysphagia - per primary team  COPD - continue to monitor oxygen sats Dementia - per primary.  Disposition - per primary team.  Palliative care is following outpatient     Claudia Desanctis, MD 11/04/2021 2:39 PM

## 2021-11-04 NOTE — NC FL2 (Addendum)
[place] LEVEL OF CARE SCREENING TOOL     IDENTIFICATION  Patient Name: Tanya Koch Birthdate: [birth year] Sex: female Admission Date (Current Location): 11/03/2021  [facility] and [place] Number:  Herbalist and Address:  [facility], [place] [address], [place], [place] [address]      Provider Number: [id number]  Attending Physician Name and Address:  Dickie La, MD  Relative Name and Phone Number:       Current Level of Care: Hospital Recommended Level of Care: Memory Care Prior Approval Number:    Date Approved/Denied:   PASRR Number:    Discharge Plan: Other (Comment) (Memory Care)    Current Diagnoses: Patient Active Problem List   Diagnosis Date Noted   Anxiety 11/04/2021   Acute exacerbation of CHF (congestive heart failure) ([place]) 11/03/2021   CHF (congestive heart failure) ([place]) 11/03/2021   Atrial fibrillation ([place]) 11/03/2021   Dysphagia 11/03/2021   Closed left hip fracture, initial encounter ([place]) 09/08/2021   Hypertension 09/08/2021   Emphysema lung ([place]) 09/08/2021   Hyperlipidemia 09/08/2021   Hypothermia 09/08/2021   Normocytic anemia 09/08/2021   Leukocytosis 09/08/2021   Closed right hip fracture, sequela 02/10/2021   Dementia without behavioral disturbance ([place]) 12/05/2020   GERD (gastroesophageal reflux disease) 12/05/2020   Chronic kidney disease (CKD), stage IV (severe) ([place]) 07/30/2020    Orientation RESPIRATION BLADDER Height & Weight     Self  Room air Incontinent Weight: 134 lb 11.2 oz (61.1 kg) Height:  5\' 5"  (165.1 cm)  BEHAVIORAL SYMPTOMS/MOOD NEUROLOGICAL BOWEL NUTRITION STATUS      Incontinent Diet (continue diet)  AMBULATORY STATUS COMMUNICATION OF NEEDS Skin   Extensive Assist Verbally Normal                       Personal Care Assistance Level of Assistance  Bathing, Feeding, Dressing Bathing Assistance: Limited assistance Feeding assistance: Independent Dressing  Assistance: Limited assistance     Functional Limitations Info  Sight, Hearing, Speech Sight Info: Adequate Hearing Info: Impaired Speech Info: Adequate    SPECIAL CARE FACTORS FREQUENCY                       Contractures Contractures Info: Not present    Additional Factors Info  Code Status, Allergies Code Status Info: DNR Allergies Info: No known allergies             Discharge Medications: TAKE these medications     acetaminophen 325 MG tablet Commonly known as: TYLENOL Take 650 mg by mouth every 6 (six) hours as needed for moderate pain.    ALPRAZolam 0.25 MG tablet Commonly known as: XANAX Take 0.25 mg by mouth 2 (two) times daily as needed for anxiety (aggitation).    alum & mag hydroxide-simeth 200-200-20 MG/5ML suspension Commonly known as: MAALOX/MYLANTA Take 30 mLs by mouth every 4 (four) hours as needed for indigestion or heartburn.    aspirin 81 MG chewable tablet Chew 81 mg by mouth daily.    calcium carbonate 500 MG chewable tablet Commonly known as: TUMS - dosed in mg elemental calcium Chew 1,000 mg by mouth daily.    calcium elemental as carbonate 400 MG chewable tablet Commonly known as: BARIATRIC TUMS ULTRA Chew 1,000 mg by mouth daily.    cyanocobalamin 1000 MCG tablet Commonly known as: VITAMIN B12 Take 2,000 mcg by mouth daily.    diphenhydrAMINE 25 MG tablet Commonly known as: BENADRYL  Take 0.5 tablets (12.5 mg total) by mouth every 6 (six) hours as needed. What changed: reasons to take this    docusate sodium 100 MG capsule Commonly known as: COLACE Take 100 mg by mouth 2 (two) times daily.    escitalopram 10 MG tablet Commonly known as: LEXAPRO Take 1 tablet (10 mg total) by mouth daily.    feeding supplement Liqd Take 237 mLs by mouth 2 (two) times daily between meals.    ferrous sulfate 325 (65 FE) MG tablet Take 325 mg by mouth daily with breakfast.    folic acid 1 MG tablet Commonly known as: FOLVITE Take  1 mg by mouth daily.    furosemide 20 MG tablet Commonly known as: Lasix Take 1 tablet (20 mg total) by mouth every other day as needed for fluid or edema.    HYDROcodone-acetaminophen 5-325 MG tablet Commonly known as: Norco Take 1 tablet by mouth every 6 (six) hours as needed. What changed: reasons to take this    magnesium oxide 400 (240 Mg) MG tablet Commonly known as: MAG-OX Take 400 mg by mouth 2 (two) times daily.    metoprolol tartrate 25 MG tablet Commonly known as: LOPRESSOR Take 0.5 tablets (12.5 mg total) by mouth 2 (two) times daily.    mouth rinse Liqd solution 15 mLs by Mouth Rinse route as needed (reason not listed on MAR).    ondansetron 4 MG tablet Commonly known as: ZOFRAN Take 1 tablet (4 mg total) by mouth every 8 (eight) hours as needed for nausea or vomiting.    pantoprazole 40 MG tablet Commonly known as: PROTONIX Take 40 mg by mouth daily.    polyethylene glycol 17 g packet Commonly known as: MIRALAX / GLYCOLAX Take 17 g by mouth daily as needed for mild constipation.    PRESCRIPTION MEDICATION Take 3 mg by mouth. Menthol-Cetylpyridium lozenge    rosuvastatin 10 MG tablet Commonly known as: CRESTOR Take 10 mg by mouth at bedtime.    Zinc 30 MG Tabs Take 30 mg by mouth daily.      Relevant Imaging Results:  Relevant Lab Results:   Additional Information SSN: [id number],  Bethann Berkshire, LCSW

## 2021-11-04 NOTE — TOC Transition Note (Signed)
Transition of Care [facility]) - CM/SW Discharge Note   Patient Details  Name: Tanya Koch MRN: [id number] Date of Birth: [birth year]  [facility]) CM/SW Contact:  Bethann Berkshire, [place] Phone Number: 11/04/2021, 1:58 PM   Clinical Narrative:     Patient will DC to: [facility] Anticipated DC date: 11/04/21 Family notified: Son, Gershon Mussel Transport by: Corey Harold   Per MD patient ready for DC to [facility] memory care. RN, patient, patient's family, and facility notified of DC. Discharge Summary and FL2 sent to facility. RN to call report prior to discharge [phone]). DC packet on chart. Ambulance transport requested for patient.   CSW will sign off for now as social work intervention is no longer needed. Please consult [place] again if new needs arise.   Final next level of care: Memory Care Barriers to Discharge: Continued Medical Work up   Patient Goals and CMS Choice        Discharge Placement                       Discharge Plan and Services                                     Social Determinants of Health (SDOH) Interventions     Readmission Risk Interventions     No data to display

## 2021-11-04 NOTE — Progress Notes (Signed)
   11/04/21 0000  Assess: MEWS Score  Temp 97.9 F (36.6 C)  BP (!) 119/98  Pulse Rate (!) 102  Resp (!) 24  Level of Consciousness Alert  SpO2 99 %  O2 Device Nasal Cannula  O2 Flow Rate (L/min) 2 L/min  Assess: MEWS Score  MEWS Temp 0  MEWS Systolic 0  MEWS Pulse 1  MEWS RR 1  MEWS LOC 0  MEWS Score 2  MEWS Score Color Yellow  Assess: if the MEWS score is Yellow or Red  Were vital signs taken at a resting state? No  Focused Assessment No change from prior assessment  Does the patient meet 2 or more of the SIRS criteria? No  MEWS guidelines implemented *See Row Information* No, other (Comment) (pt moved to the bed from the stretcher)  Document  Patient Outcome Stabilized after interventions  Progress note created (see row info) Yes  Assess: SIRS CRITERIA  SIRS Temperature  0  SIRS Pulse 1  SIRS Respirations  1  SIRS WBC 1  SIRS Score Sum  3

## 2021-11-04 NOTE — Assessment & Plan Note (Addendum)
Patient continues to have intermittent anxiety attacks where she states that she cannot breathe. This seems to occur when she is left alone in a room.  - Xanax 0.25 mg once per day plus PRN - Continue Lexapro as well

## 2021-11-04 NOTE — Progress Notes (Signed)
Heart Failure Navigator Progress Note  Assessed for Heart & Vascular TOC clinic readiness.  Patient does not meet criteria due to not acute heart failure.   Navigator available for reassessment of patient.   Kabrina Christiano, BSN, RN Heart Failure Nurse Navigator Secure Chat Only   

## 2021-11-04 NOTE — Progress Notes (Signed)
Called patient's son to discuss progress and discharge. He is reassured that she is able to discharge today, she was looking better when he saw her this morning. They are following with palliative outpatient and we discussed the plan for starting intermittent Lasix dosing.    Kaylee Wombles, DO

## 2021-11-04 NOTE — Hospital Course (Addendum)
Brief Hospital Course: Tanya Koch is a 88 y.o. female with a PMHx of CHF, HTN, Afib, dementia, COPD who presented with SOB and elevated HR via EMS from senior living. Her hospital course is outlined below:  * Acute exacerbation of CHF (congestive heart failure)  Patient presented with shortness of breath without hypoxia. CXR showed right lower lobe opacity. Patient was non-infectious appearing on examination and feel that the opacity may have been related to fluid overload. Physical exam and labs consistent with fluid overload/CHF exacerbation that is milder in nature in the setting of Afib with RVR. Patient was gently diuresed wth IV Lasix 40mg  once. Given her improvement after this dose, feel that it would be appropriate to have intermittent Lasix dosing at her facility with monitoring of volume and respiratory status.  Atrial Fibrillation Patient was initially in Afib with RVR, which likely contributed to volume overload. There was concern in the ER as patient was also slightly hypotensive and received a 500mg  bolus of IV fluids. She was continued on her home medications aspirin 81 mg and metoprolol 12.5 mg.  Dysphagia Family reports 6 month history of vomiting up soft food diet. No known aspiration event. Speech evaluated the patient and she was cleared for regular diet with mild aspiration risk.  Chronic kidney disease (CKD), stage IV (severe)  Patient had mildly elevated potassium on admission, but appeared that the blood samples were hemolyzed on the first 2 draws. Patient received IV lasix for one dose as well as Lokelma for one dose. Potassium on repeat was within normal limits.

## 2021-11-04 NOTE — Evaluation (Signed)
Clinical/Bedside Swallow Evaluation Patient Details  Name: Tanya Koch MRN: [id number] Date of Birth: [birth year]  Today's Date: 11/04/2021 Time: SLP Start Time (ACUTE ONLY): 3810 SLP Stop Time (ACUTE ONLY): 0932 SLP Time Calculation (min) (ACUTE ONLY): 11 min  Past Medical History:  Past Medical History:  Diagnosis Date   Bradycardia    CHF (congestive heart failure) (HCC)    Closed right hip fracture, sequela 02/10/2021   COVID 12/06/2020   Emphysema, unspecified ([place])    Hematemesis 12/05/2020   History of MI (myocardial infarction)    Hypertension    Past Surgical History:  Past Surgical History:  Procedure Laterality Date   HIP ARTHROPLASTY Right    TOTAL HIP ARTHROPLASTY Left 09/09/2021   Procedure: LEFT HIP HEMI ARTHROPLASTY;  Surgeon: Leandrew Koyanagi, MD;  Location: [facility];  Service: Orthopedics;  Laterality: Left;   HPI:  Tanya Koch is a 88 y.o. female presenting with SOB and elevated HR. Per chart, differential for this patient's presentation includes systolic CHF exacerbation with Afib (most likely based on CXR findings), aspiration PNA (due to dysphagia history, although unlikely with afebrile and no white count), PE, COPD exacerbation (given heavy smoking history with no true COPD dx).    Assessment / Plan / Recommendation  Clinical Impression  Pt's breathing was normal on encounter for swallow evaluation. Her respirations and swallow coordinated without dyspnea noted. Oromotor exam unremarkable. Consumed puree, thin and cracker with what appeared to be timely onset and no signs of airway compromise. She has a history of suspected esophageal involvement from [facility] 07/2021 (esophageal stasis). Today she did not show indications of esophageal dysphagia during intake. Educated on strategies to facilitate esophageal phase during and after meals. Regular texture, thin liquids, pills whole wtih thin. No further ST needed at this time. SLP Visit Diagnosis: Dysphagia, unspecified  (R13.10)    Aspiration Risk  Mild aspiration risk    Diet Recommendation Regular;Thin liquid   Liquid Administration via: Straw;Cup Medication Administration: Whole meds with liquid Supervision: Patient able to self feed;Intermittent supervision to cue for compensatory strategies Compensations: Small sips/bites;Slow rate Postural Changes: Seated upright at 90 degrees;Remain upright for at least 30 minutes after po intake    Other  Recommendations Oral Care Recommendations: Oral care BID    Recommendations for follow up therapy are one component of a multi-disciplinary discharge planning process, led by the attending physician.  Recommendations may be updated based on patient status, additional functional criteria and insurance authorization.  Follow up Recommendations No SLP follow up      Assistance Recommended at Discharge Intermittent Supervision/Assistance  Functional Status Assessment Patient has had a recent decline in their functional status and demonstrates the ability to make significant improvements in function in a reasonable and predictable amount of time.  Frequency and Duration            Prognosis        Swallow Study   General HPI: Tanya Koch is a 88 y.o. female presenting with SOB and elevated HR. Per chart, differential for this patient's presentation includes systolic CHF exacerbation with Afib (most likely based on CXR findings), aspiration PNA (due to dysphagia history, although unlikely with afebrile and no white count), PE, COPD exacerbation (given heavy smoking history with no true COPD dx). Type of Study: Bedside Swallow Evaluation Previous Swallow Assessment:  (MBS 07/2021 normal age related flash penetration and suspected esophageal dysphagia) Diet Prior to this Study: NPO Temperature Spikes Noted: No Respiratory Status: Nasal cannula (pt had removed  and sating ok) History of Recent Intubation: No Behavior/Cognition: Alert;Cooperative;Pleasant  mood (HOH) Oral Cavity Assessment: Within Functional Limits Oral Care Completed by SLP: No Oral Cavity - Dentition: Adequate natural dentition Vision: Functional for self-feeding (although stated "I can't see good out of my right eye") Self-Feeding Abilities: Able to feed self Patient Positioning: Upright in bed Baseline Vocal Quality: Normal Volitional Cough: Weak    Oral/Motor/Sensory Function Overall Oral Motor/Sensory Function: Within functional limits   Ice Chips Ice chips: Not tested   Thin Liquid Thin Liquid: Within functional limits Presentation: Cup;Straw    Nectar Thick Nectar Thick Liquid: Not tested   Honey Thick Honey Thick Liquid: Not tested   Puree Puree: Within functional limits   Solid     Solid: Within functional limits      Tanya Koch 11/04/2021,10:03 AM

## 2021-11-04 NOTE — Plan of Care (Signed)
  Problem: Pain Managment: Goal: General experience of comfort will improve Outcome: Completed/Met

## 2021-11-05 ENCOUNTER — Emergency Department (HOSPITAL_COMMUNITY): Payer: Medicare Other

## 2021-11-05 ENCOUNTER — Other Ambulatory Visit: Payer: Self-pay

## 2021-11-05 ENCOUNTER — Observation Stay (HOSPITAL_COMMUNITY): Payer: Medicare Other

## 2021-11-05 ENCOUNTER — Inpatient Hospital Stay (HOSPITAL_COMMUNITY)
Admission: EM | Admit: 2021-11-05 | Discharge: 2021-11-08 | DRG: 291 | Disposition: A | Discharge status: Hospice | Payer: Medicare Other | Source: Skilled Nursing Facility | Attending: Family Medicine | Admitting: Family Medicine

## 2021-11-05 DIAGNOSIS — I130 Hypertensive heart and chronic kidney disease with heart failure and stage 1 through stage 4 chronic kidney disease, or unspecified chronic kidney disease: Principal | ICD-10-CM | POA: Diagnosis present

## 2021-11-05 DIAGNOSIS — Z96643 Presence of artificial hip joint, bilateral: Secondary | ICD-10-CM | POA: Diagnosis present

## 2021-11-05 DIAGNOSIS — I5043 Acute on chronic combined systolic (congestive) and diastolic (congestive) heart failure: Secondary | ICD-10-CM | POA: Diagnosis not present

## 2021-11-05 DIAGNOSIS — Z79899 Other long term (current) drug therapy: Secondary | ICD-10-CM

## 2021-11-05 DIAGNOSIS — N184 Chronic kidney disease, stage 4 (severe): Secondary | ICD-10-CM | POA: Diagnosis not present

## 2021-11-05 DIAGNOSIS — Z8249 Family history of ischemic heart disease and other diseases of the circulatory system: Secondary | ICD-10-CM

## 2021-11-05 DIAGNOSIS — E785 Hyperlipidemia, unspecified: Secondary | ICD-10-CM | POA: Diagnosis present

## 2021-11-05 DIAGNOSIS — S72001A Fracture of unspecified part of neck of right femur, initial encounter for closed fracture: Secondary | ICD-10-CM | POA: Diagnosis present

## 2021-11-05 DIAGNOSIS — J90 Pleural effusion, not elsewhere classified: Secondary | ICD-10-CM

## 2021-11-05 DIAGNOSIS — I5030 Unspecified diastolic (congestive) heart failure: Secondary | ICD-10-CM

## 2021-11-05 DIAGNOSIS — I472 Ventricular tachycardia, unspecified: Secondary | ICD-10-CM | POA: Diagnosis not present

## 2021-11-05 DIAGNOSIS — K5900 Constipation, unspecified: Secondary | ICD-10-CM | POA: Diagnosis present

## 2021-11-05 DIAGNOSIS — R7989 Other specified abnormal findings of blood chemistry: Principal | ICD-10-CM

## 2021-11-05 DIAGNOSIS — I080 Rheumatic disorders of both mitral and aortic valves: Secondary | ICD-10-CM | POA: Diagnosis present

## 2021-11-05 DIAGNOSIS — L89151 Pressure ulcer of sacral region, stage 1: Secondary | ICD-10-CM | POA: Diagnosis present

## 2021-11-05 DIAGNOSIS — T68XXXA Hypothermia, initial encounter: Secondary | ICD-10-CM | POA: Diagnosis present

## 2021-11-05 DIAGNOSIS — E872 Acidosis, unspecified: Secondary | ICD-10-CM | POA: Diagnosis present

## 2021-11-05 DIAGNOSIS — S72001S Fracture of unspecified part of neck of right femur, sequela: Secondary | ICD-10-CM

## 2021-11-05 DIAGNOSIS — F02C4 Dementia in other diseases classified elsewhere, severe, with anxiety: Secondary | ICD-10-CM | POA: Diagnosis present

## 2021-11-05 DIAGNOSIS — I428 Other cardiomyopathies: Secondary | ICD-10-CM | POA: Diagnosis present

## 2021-11-05 DIAGNOSIS — R1310 Dysphagia, unspecified: Secondary | ICD-10-CM

## 2021-11-05 DIAGNOSIS — E875 Hyperkalemia: Secondary | ICD-10-CM | POA: Diagnosis present

## 2021-11-05 DIAGNOSIS — Z66 Do not resuscitate: Secondary | ICD-10-CM | POA: Diagnosis present

## 2021-11-05 DIAGNOSIS — I110 Hypertensive heart disease with heart failure: Secondary | ICD-10-CM

## 2021-11-05 DIAGNOSIS — I509 Heart failure, unspecified: Secondary | ICD-10-CM

## 2021-11-05 DIAGNOSIS — J439 Emphysema, unspecified: Secondary | ICD-10-CM | POA: Diagnosis present

## 2021-11-05 DIAGNOSIS — I959 Hypotension, unspecified: Secondary | ICD-10-CM | POA: Diagnosis present

## 2021-11-05 DIAGNOSIS — D631 Anemia in chronic kidney disease: Secondary | ICD-10-CM | POA: Diagnosis present

## 2021-11-05 DIAGNOSIS — F0390 Unspecified dementia without behavioral disturbance: Secondary | ICD-10-CM | POA: Diagnosis present

## 2021-11-05 DIAGNOSIS — D649 Anemia, unspecified: Secondary | ICD-10-CM | POA: Diagnosis present

## 2021-11-05 DIAGNOSIS — R791 Abnormal coagulation profile: Secondary | ICD-10-CM | POA: Diagnosis present

## 2021-11-05 DIAGNOSIS — R7401 Elevation of levels of liver transaminase levels: Secondary | ICD-10-CM | POA: Diagnosis present

## 2021-11-05 DIAGNOSIS — R627 Adult failure to thrive: Secondary | ICD-10-CM | POA: Diagnosis present

## 2021-11-05 DIAGNOSIS — I5023 Acute on chronic systolic (congestive) heart failure: Secondary | ICD-10-CM | POA: Diagnosis present

## 2021-11-05 DIAGNOSIS — I252 Old myocardial infarction: Secondary | ICD-10-CM

## 2021-11-05 DIAGNOSIS — R680 Hypothermia, not associated with low environmental temperature: Secondary | ICD-10-CM | POA: Diagnosis present

## 2021-11-05 DIAGNOSIS — I10 Essential (primary) hypertension: Secondary | ICD-10-CM | POA: Diagnosis present

## 2021-11-05 DIAGNOSIS — L8990 Pressure ulcer of unspecified site, unspecified stage: Secondary | ICD-10-CM | POA: Insufficient documentation

## 2021-11-05 DIAGNOSIS — K8020 Calculus of gallbladder without cholecystitis without obstruction: Secondary | ICD-10-CM | POA: Diagnosis present

## 2021-11-05 DIAGNOSIS — R188 Other ascites: Secondary | ICD-10-CM | POA: Diagnosis present

## 2021-11-05 DIAGNOSIS — I2510 Atherosclerotic heart disease of native coronary artery without angina pectoris: Secondary | ICD-10-CM | POA: Diagnosis present

## 2021-11-05 DIAGNOSIS — F419 Anxiety disorder, unspecified: Secondary | ICD-10-CM | POA: Diagnosis present

## 2021-11-05 DIAGNOSIS — I4891 Unspecified atrial fibrillation: Secondary | ICD-10-CM | POA: Diagnosis not present

## 2021-11-05 DIAGNOSIS — G309 Alzheimer's disease, unspecified: Secondary | ICD-10-CM | POA: Diagnosis present

## 2021-11-05 DIAGNOSIS — Z87891 Personal history of nicotine dependence: Secondary | ICD-10-CM

## 2021-11-05 DIAGNOSIS — K219 Gastro-esophageal reflux disease without esophagitis: Secondary | ICD-10-CM | POA: Diagnosis present

## 2021-11-05 DIAGNOSIS — I4821 Permanent atrial fibrillation: Secondary | ICD-10-CM | POA: Diagnosis present

## 2021-11-05 DIAGNOSIS — Z515 Encounter for palliative care: Secondary | ICD-10-CM

## 2021-11-05 DIAGNOSIS — H9190 Unspecified hearing loss, unspecified ear: Secondary | ICD-10-CM | POA: Diagnosis present

## 2021-11-05 DIAGNOSIS — Z8616 Personal history of COVID-19: Secondary | ICD-10-CM

## 2021-11-05 DIAGNOSIS — Z86711 Personal history of pulmonary embolism: Secondary | ICD-10-CM

## 2021-11-05 DIAGNOSIS — Z7982 Long term (current) use of aspirin: Secondary | ICD-10-CM

## 2021-11-05 DIAGNOSIS — Z993 Dependence on wheelchair: Secondary | ICD-10-CM

## 2021-11-05 LAB — COMPREHENSIVE METABOLIC PANEL
ALT: 485 U/L — ABNORMAL HIGH (ref 0–44)
AST: 618 U/L — ABNORMAL HIGH (ref 15–41)
Albumin: 2.8 g/dL — ABNORMAL LOW (ref 3.5–5.0)
Alkaline Phosphatase: 71 U/L (ref 38–126)
Anion gap: 16 — ABNORMAL HIGH (ref 5–15)
BUN: 64 mg/dL — ABNORMAL HIGH (ref 8–23)
CO2: 20 mmol/L — ABNORMAL LOW (ref 22–32)
Calcium: 8.4 mg/dL — ABNORMAL LOW (ref 8.9–10.3)
Chloride: 104 mmol/L (ref 98–111)
Creatinine, Ser: 3.55 mg/dL — ABNORMAL HIGH (ref 0.44–1.00)
GFR, Estimated: 12 mL/min — ABNORMAL LOW (ref >60)
Glucose, Bld: 120 mg/dL — ABNORMAL HIGH (ref 70–99)
Potassium: 4.4 mmol/L (ref 3.5–5.1)
Sodium: 140 mmol/L (ref 135–145)
Total Bilirubin: 0.9 mg/dL (ref 0.3–1.2)
Total Protein: 6.1 g/dL — ABNORMAL LOW (ref 6.5–8.1)

## 2021-11-05 LAB — ECHOCARDIOGRAM COMPLETE
AR max vel: 0.72 cm2
AV Area VTI: 0.63 cm2
AV Area mean vel: 0.75 cm2
AV Mean grad: 1.9 mmHg
AV Peak grad: 4.5 mmHg
Ao pk vel: 1.06 m/s
Area-P 1/2: 7.7 cm2
Calc EF: 20.7 %
MV M vel: 4.85 m/s
MV Peak grad: 94.1 mmHg
Radius: 0.4 cm
S' Lateral: 4.2 cm
Single Plane A2C EF: 18.5 %
Single Plane A4C EF: 20.2 %

## 2021-11-05 LAB — CBC WITH DIFFERENTIAL/PLATELET
Abs Immature Granulocytes: 0.03 10*3/uL (ref 0.00–0.07)
Basophils Absolute: 0.1 10*3/uL (ref 0.0–0.1)
Basophils Relative: 1 %
Eosinophils Absolute: 0 10*3/uL (ref 0.0–0.5)
Eosinophils Relative: 0 %
HCT: 32.2 % — ABNORMAL LOW (ref 36.0–46.0)
Hemoglobin: 10.5 g/dL — ABNORMAL LOW (ref 12.0–15.0)
Immature Granulocytes: 0 %
Lymphocytes Relative: 18 %
Lymphs Abs: 1.5 10*3/uL (ref 0.7–4.0)
MCH: 32.6 pg (ref 26.0–34.0)
MCHC: 32.6 g/dL (ref 30.0–36.0)
MCV: 100 fL (ref 80.0–100.0)
Monocytes Absolute: 1.3 10*3/uL — ABNORMAL HIGH (ref 0.1–1.0)
Monocytes Relative: 16 %
Neutro Abs: 5.2 10*3/uL (ref 1.7–7.7)
Neutrophils Relative %: 65 %
Platelets: 210 10*3/uL (ref 150–400)
RBC: 3.22 MIL/uL — ABNORMAL LOW (ref 3.87–5.11)
RDW: 20.1 % — ABNORMAL HIGH (ref 11.5–15.5)
WBC: 8.2 10*3/uL (ref 4.0–10.5)
nRBC: 0 % (ref 0.0–0.2)

## 2021-11-05 LAB — LACTIC ACID, PLASMA: Lactic Acid, Venous: 1.9 mmol/L (ref 0.5–1.9)

## 2021-11-05 LAB — PROTIME-INR
INR: 1.5 — ABNORMAL HIGH (ref 0.8–1.2)
Prothrombin Time: 17.6 seconds — ABNORMAL HIGH (ref 11.4–15.2)

## 2021-11-05 LAB — PROCALCITONIN: Procalcitonin: 0.34 ng/mL

## 2021-11-05 LAB — TROPONIN I (HIGH SENSITIVITY)
Troponin I (High Sensitivity): 60 ng/L — ABNORMAL HIGH (ref <18)
Troponin I (High Sensitivity): 60 ng/L — ABNORMAL HIGH (ref <18)

## 2021-11-05 LAB — BRAIN NATRIURETIC PEPTIDE: B Natriuretic Peptide: 3870.2 pg/mL — ABNORMAL HIGH (ref 0.0–100.0)

## 2021-11-05 MED ORDER — METOPROLOL SUCCINATE ER 25 MG PO TB24
25.0000 mg | ORAL_TABLET | Freq: Every day | ORAL | Status: DC
  Administered 2021-11-05: 25 mg via ORAL
  Filled 2021-11-05: qty 1

## 2021-11-05 MED ORDER — FUROSEMIDE 10 MG/ML IJ SOLN
80.0000 mg | Freq: Once | INTRAMUSCULAR | Status: AC
  Administered 2021-11-05: 80 mg via INTRAVENOUS
  Filled 2021-11-05: qty 8

## 2021-11-05 MED ORDER — ENSURE ENLIVE PO LIQD
237.0000 mL | Freq: Two times a day (BID) | ORAL | Status: DC
Start: 2021-11-05 — End: 2021-11-08
  Administered 2021-11-06 – 2021-11-08 (×5): 237 mL via ORAL

## 2021-11-05 MED ORDER — MAGNESIUM OXIDE -MG SUPPLEMENT 400 (240 MG) MG PO TABS
400.0000 mg | ORAL_TABLET | Freq: Two times a day (BID) | ORAL | Status: DC
  Administered 2021-11-05 – 2021-11-08 (×6): 400 mg via ORAL
  Filled 2021-11-05 (×6): qty 1

## 2021-11-05 MED ORDER — FUROSEMIDE 10 MG/ML IJ SOLN
40.0000 mg | Freq: Two times a day (BID) | INTRAMUSCULAR | Status: DC
  Administered 2021-11-05 – 2021-11-08 (×6): 40 mg via INTRAVENOUS
  Filled 2021-11-05 (×6): qty 4

## 2021-11-05 MED ORDER — FOLIC ACID 1 MG PO TABS
1.0000 mg | ORAL_TABLET | Freq: Every day | ORAL | Status: DC
  Administered 2021-11-06 – 2021-11-08 (×3): 1 mg via ORAL
  Filled 2021-11-05 (×4): qty 1

## 2021-11-05 MED ORDER — PERFLUTREN LIPID MICROSPHERE
1.0000 mL | INTRAVENOUS | Status: AC | PRN
  Administered 2021-11-05: 3 mL via INTRAVENOUS

## 2021-11-05 MED ORDER — HEPARIN SODIUM (PORCINE) 5000 UNIT/ML IJ SOLN
5000.0000 [IU] | Freq: Three times a day (TID) | INTRAMUSCULAR | Status: DC
  Administered 2021-11-05 – 2021-11-08 (×10): 5000 [IU] via SUBCUTANEOUS
  Filled 2021-11-05 (×10): qty 1

## 2021-11-05 MED ORDER — ESCITALOPRAM OXALATE 10 MG PO TABS
10.0000 mg | ORAL_TABLET | Freq: Every day | ORAL | Status: DC
  Administered 2021-11-06 – 2021-11-08 (×3): 10 mg via ORAL
  Filled 2021-11-05 (×3): qty 1

## 2021-11-05 MED ORDER — POLYETHYLENE GLYCOL 3350 17 G PO PACK: 17.0000 g | PACK | Freq: Every day | ORAL | Status: DC | PRN

## 2021-11-05 MED ORDER — ASPIRIN 81 MG PO CHEW
81.0000 mg | CHEWABLE_TABLET | Freq: Every day | ORAL | Status: DC
  Administered 2021-11-06 – 2021-11-08 (×3): 81 mg via ORAL
  Filled 2021-11-05 (×4): qty 1

## 2021-11-05 MED ORDER — METOPROLOL TARTRATE 25 MG PO TABS: 12.5000 mg | ORAL_TABLET | Freq: Two times a day (BID) | ORAL | Status: DC

## 2021-11-05 MED ORDER — FERROUS SULFATE 325 (65 FE) MG PO TABS
325.0000 mg | ORAL_TABLET | Freq: Every day | ORAL | Status: DC
  Administered 2021-11-06 – 2021-11-08 (×3): 325 mg via ORAL
  Filled 2021-11-05 (×4): qty 1

## 2021-11-05 MED ORDER — ROSUVASTATIN CALCIUM 5 MG PO TABS
10.0000 mg | ORAL_TABLET | Freq: Every day | ORAL | Status: DC
  Administered 2021-11-05 – 2021-11-07 (×3): 10 mg via ORAL
  Filled 2021-11-05 (×3): qty 2

## 2021-11-05 MED ORDER — PANTOPRAZOLE SODIUM 40 MG PO TBEC
40.0000 mg | DELAYED_RELEASE_TABLET | Freq: Every day | ORAL | Status: DC
  Administered 2021-11-06 – 2021-11-08 (×3): 40 mg via ORAL
  Filled 2021-11-05 (×4): qty 1

## 2021-11-05 MED ORDER — DOCUSATE SODIUM 100 MG PO CAPS
100.0000 mg | ORAL_CAPSULE | Freq: Two times a day (BID) | ORAL | Status: DC
  Administered 2021-11-05 – 2021-11-08 (×6): 100 mg via ORAL
  Filled 2021-11-05 (×6): qty 1

## 2021-11-05 MED ORDER — ALPRAZOLAM 0.25 MG PO TABS
0.2500 mg | ORAL_TABLET | Freq: Two times a day (BID) | ORAL | Status: DC | PRN
Start: 2021-11-05 — End: 2021-11-07
  Administered 2021-11-06 (×2): 0.25 mg via ORAL
  Filled 2021-11-05 (×2): qty 1

## 2021-11-05 NOTE — Progress Notes (Signed)
Discussed work-up thus far with patient's son Gershon Mussel including EF back to 20-25% and recommendations of cardiology consult. He would be interested in any interventions that would lead to treatment, but would like to avoid severely invasive procedures such as pacemaker placement etc. Patient was recommended for pacemaker in 2017 when had prior MI but was thought to be a high surgical risk and therefore decision was made to not pursue placement.    Will reach out to cardiology to discuss potential ischemic evaluation.

## 2021-11-05 NOTE — H&P (Cosign Needed Addendum)
[facility] Admission History and Physical Service Pager: [phone]  Patient name: Tanya Koch Medical record number: [id number] Date of birth: 09-14-1928 Age: 88 y.o. Gender: female  Primary Care Provider: Earmon Phoenix, NP Code Status: DNR/DNI confirmed with son Tanya Koch Preferred Emergency Contact: Koch, Tanya [phone]  Chief Complaint: Dyspnea w/ exertion  Assessment and Plan: Tanya Koch is a 88 y/o person living with a history of dementia, CKD IV, HFpEF, atrial fibrillation, dementia living in a memory care unit who presented with recurrent dyspnea and admitted for a heart failure exacerbation.   HFrEF Exacerbation NYHA III, Stg C Bilateral pleural effusions Continued dyspnea with exertion and physical exam consistent with heart failure exacerbation. Physical exam consistent with hypervolemia, extremities are warm to touch. Do not suspect component of cardiogenic shock. Echocardiogram today with EF of 20-25% without regional wall motion abnormalities. RAP of 43mHg. Most recent echocardiogram in our system from 02/2021 with EF of 50-55%. Per chart review, EF was around 20% with moderate to severe mitral regurgitation in 2022. Troponins mildly elevated and not rising. EKG with changes in lateral leads compared to prior from 08/2021. She denies chest pain or shortness of breath. [facility] and conversations were had with patient's son and HCPOA. Decision was made to treat HF conservatively, avoiding aggressive heart failure therapies and LHC for ischemic evaluation. Only on GDMT of metoprolol and with limited medical therapy, she is high risk for continued worsening of her heart failure and arrhythmias. -Lasix IV 40 mg BID -Restart metoprolol tomorrow  -Lactic acid normal, perfusing well.  -Strict I/O, daily weights -Mag over 2 and K over 4, replete as needed -Continue aspirin 81 mg daily -Will discuss with son TGershon Musselto see  if he would like cardiology consult for further work up. Last note wanted to focus on non-aggressive medical therapy for her HFrEF. Did not want to pursue LHC or CRT placement.   Hx of CAD [place] Denies knowing of any cardiac catheterization or PCI being done. Does have EKG changes in lateral leads from prior EKG in July. No documentation in care everywhere, but patient's son does have paperwork at home.  -continue aspirin -restart metoprolol tomorrow  Elevated transaminase levels On presentation denies abdominal pain and has unremarkable abdominal examination. No evidence of jaundice. AST of 618 and ALT of 485. T. Bili normal and alk phos normal. RUQ with coarse liver and nodularity consistent with cirrhotic appearing liver. With untreated heart failure possibility that she has now developed cirrhosis however liver changes may be secondary to congestive hepatopathy. She will need further imaging once she is diuresed appropriately.  Acute hepatitis panel pending and PT/INR to assess liver function.  -Diuresis per above -Follow-up acute hepatitis panel -PT/INR pending -Lactic acid normal -monitor isolated RUQ ascites.   CKD IV Baseline 2.1-2.5. Over last two admissions elevations in creatinine. Likely congestive from heart failure exacerbation. Continue diuresing. Per last nephrolog note, not a candidate for ESRD with severe dementia. Palliative care following outpatient.  -Monitor renal function while diuresing.  -If continues to worsen or decrease urine output, will order UA and renal [place]-Consult nephrology if no improvement  Anion gap metabolic acidosis Likely in setting of acute heart failure disposition and worsening renal function and hepatic function.  Hopeful will improve with diuresis. -Daily BMP -Lactic acid normal  Severe dementia At baseline per chart review.   RLL opacity Chest  x-ray was possible right lower lobe  opacity.  Likely vascular congestion.  Procalcitonin 0.3.  She has been afebrile without a leukocytosis.  Low suspicion for infectious etiology.  If develops a fever or leukocytosis can consider repeat imaging and further work-up for infectious etiologies.  Atrial Fibrillation Home regimen of metoprolol 12.5 BID. Not on anticoagulation, unable to find discussions on this on chart review. Does have history of hematemesis -restart metoprolol tomorrow -follow up with son regarding anticoagulation.  -CHA2DS2VASc of 6 points.  Cholelithiasis No evidence of cholecystitis, common bile duct dilation. Continue to monitor  Code Status: DNR Diet: Regular DVT Prophylaxis: Heparin Family Updated morning of 9/9  Disposition: Back to [facility] once diuresed and further work-up completed  History of Present Illness:    HPI limited secondary to patient's advanced dementia and was mostly aquired from her son. In short, she is a 33 y/o person living with a history of dementia, CKD IV, HFpEF, atrial fibrillation, dementia living in a memory care who was sent by EMS after she had persistent dyspena with exertion. She was admitted from the hospital 9/7-9/8 with a heart failure exacerbation and discharged with a diuretic regimen of lasix 20 mg MWF. She was at her baseline at discharge and sent back to her memory care unit at [facility]. Over the last 24 hours she has had worsening dyspnea with exertion which is not her baseline. She was then sent back to the hospital for persistent dyspnea.   She is resting in bed comfortably, pleasantly demented and unable to answer my questions. She denies chest pain or shortness of breath.  Denies any pain elsewhere.  Notes that she feels well but is thirsty.  Review Of Systems: Per HPI  Patient Active Problem List   Diagnosis Date Noted   Anxiety 11/04/2021   Acute exacerbation of CHF (congestive heart failure) ([place]) 11/03/2021   CHF (congestive heart failure) ([place])  11/03/2021   Atrial fibrillation ([place]) 11/03/2021   Dysphagia 11/03/2021   Closed left hip fracture, initial encounter ([place]) 09/08/2021   Hypertension 09/08/2021   Emphysema lung ([place]) 09/08/2021   Hyperlipidemia 09/08/2021   Hypothermia 09/08/2021   Normocytic anemia 09/08/2021   Leukocytosis 09/08/2021   Closed right hip fracture, sequela 02/10/2021   Dementia without behavioral disturbance ([place]) 12/05/2020   GERD (gastroesophageal reflux disease) 12/05/2020   Chronic kidney disease (CKD), stage IV (severe) ([place]) 07/30/2020    Past Medical History: Past Medical History:  Diagnosis Date   Bradycardia    CHF (congestive heart failure) ([place])    Closed right hip fracture, sequela 02/10/2021   COVID 12/06/2020   Emphysema, unspecified ([place])    Hematemesis 12/05/2020   History of MI (myocardial infarction)    Hypertension     Past Surgical History: Past Surgical History:  Procedure Laterality Date   HIP ARTHROPLASTY Right    TOTAL HIP ARTHROPLASTY Left 09/09/2021   Procedure: LEFT HIP HEMI ARTHROPLASTY;  Surgeon: Leandrew Koyanagi, MD;  Location: [facility];  Service: Orthopedics;  Laterality: Left;    Social History: Social History   Tobacco Use   Smoking status: Former    Years: 50.00    Types: Cigarettes    Passive exposure: Never   Smokeless tobacco: Never  Substance Use Topics   Alcohol use: Not Currently   Drug use: Never   Additional social history: Lives in [place] [facility] at [facility]. Her son Gershon Mussel is her POA.   Please also refer to relevant sections of EMR.  Family History: Family History  Problem Relation Age of Onset   Hypertension Other    Allergies and Medications: No Known Allergies No current facility-administered medications on file prior to encounter.   Current Outpatient Medications on File Prior to Encounter  Medication Sig Dispense Refill   acetaminophen (TYLENOL) 325 MG tablet Take 650 mg by mouth every 6 (six) hours as needed for moderate  pain.     ALPRAZolam (XANAX) 0.25 MG tablet Take 0.25 mg by mouth 2 (two) times daily as needed for anxiety (aggitation).     alum & mag hydroxide-simeth (MAALOX/MYLANTA) 200-200-20 MG/5ML suspension Take 30 mLs by mouth every 4 (four) hours as needed for indigestion or heartburn.     aspirin 81 MG chewable tablet Chew 81 mg by mouth daily.     calcium carbonate (TUMS - DOSED IN MG ELEMENTAL CALCIUM) 500 MG chewable tablet Chew 1,000 mg by mouth daily. (Patient not taking: Reported on 11/03/2021)     calcium elemental as carbonate (BARIATRIC TUMS ULTRA) 400 MG chewable tablet Chew 1,000 mg by mouth daily.     diphenhydrAMINE (BENADRYL) 25 MG tablet Take 0.5 tablets (12.5 mg total) by mouth every 6 (six) hours as needed. (Patient taking differently: Take 12.5 mg by mouth every 6 (six) hours as needed (reason not listed on MAR).) 30 tablet 0   docusate sodium (COLACE) 100 MG capsule Take 100 mg by mouth 2 (two) times daily.     escitalopram (LEXAPRO) 10 MG tablet Take 1 tablet (10 mg total) by mouth daily. 10 tablet 0   feeding supplement (ENSURE ENLIVE / ENSURE PLUS) LIQD Take 237 mLs by mouth 2 (two) times daily between meals. 237 mL 12   ferrous sulfate 325 (65 FE) MG tablet Take 325 mg by mouth daily with breakfast.     folic acid (FOLVITE) 1 MG tablet Take 1 mg by mouth daily.     furosemide (LASIX) 20 MG tablet Take 1 tablet (20 mg total) by mouth every Monday, Wednesday, and Friday. 30 tablet 1   HYDROcodone-acetaminophen (NORCO) 5-325 MG tablet Take 1 tablet by mouth every 6 (six) hours as needed. (Patient taking differently: Take 1 tablet by mouth every 6 (six) hours as needed (pain).) 12 tablet 0   magnesium oxide (MAG-OX) 400 (240 Mg) MG tablet Take 400 mg by mouth 2 (two) times daily.     metoprolol tartrate (LOPRESSOR) 25 MG tablet Take 0.5 tablets (12.5 mg total) by mouth 2 (two) times daily. 20 tablet    Mouthwashes (MOUTH RINSE) LIQD solution 15 mLs by Mouth Rinse route as needed (reason  not listed on MAR).     ondansetron (ZOFRAN) 4 MG tablet Take 1 tablet (4 mg total) by mouth every 8 (eight) hours as needed for nausea or vomiting. 12 tablet 0   pantoprazole (PROTONIX) 40 MG tablet Take 40 mg by mouth daily.     polyethylene glycol (MIRALAX / GLYCOLAX) 17 g packet Take 17 g by mouth daily as needed for mild constipation. 14 each 0   PRESCRIPTION MEDICATION Take 3 mg by mouth. Menthol-Cetylpyridium lozenge     rosuvastatin (CRESTOR) 10 MG tablet Take 10 mg by mouth at bedtime.     vitamin B-12 (CYANOCOBALAMIN) 1000 MCG tablet Take 2,000 mcg by mouth daily.     Zinc 30 MG TABS Take 30 mg by mouth daily.      Objective: BP (!) 156/106 (BP Location: Right Arm)   Pulse 95   Temp 98 F (36.7 C) (  Oral)   Resp (!) 23   SpO2 98%  Exam: General: elderly appearing Eyes: PERRL ENTM: normocephalic atraumatic Neck: Nontender, normal range of motion Cardiovascular: Irregular rate and rhythm.  No murmurs gallops or rubs. JVD to the angle of the mandible. Respiratory: No respiratory distress on room air.  Lungs with faint crackles at the bases bilaterally Gastrointestinal: Soft, nontender, nondistended abdomen. MSK: Lower extremities warm to touch and dry. 1+ pitting edema Derm: No rashes appreciated Neuro: Alert and oriented to self only, baseline Psych: Pleasantly demented  Labs and Imaging: CBC BMET  Recent Labs  Lab 11/05/21 0448  WBC 8.2  HGB 10.5*  HCT 32.2*  PLT 210   Recent Labs  Lab 11/05/21 0448  NA 140  K 4.4  CL 104  CO2 20*  BUN 64*  CREATININE 3.55*  GLUCOSE 120*  CALCIUM 8.4*     EKG: My own interpretation irregular rhythm consistent with atrial fibrillation, does have ST wave changes in lateral leads on last two ekgs compared to prior from 08/2021  Chest x-ray with bilateral pleural effusions with indistinct density in the right lower lobe  [place]  Internal Medicine Resident PGY-3 [place]  Pager: [phone]

## 2021-11-05 NOTE — ED Triage Notes (Signed)
Pt coming from [facility] memory care unit via [facility] c/o SOB and irregular breathing. EMS stated lung sounds were clear but pt has a cough. Pt states she feels fine. EKG showed Afib at a rate of 80-115 and LVH. Pt reports no pain at this time. Pt is hard of hearing and uses an amplifier but it is not with [place]. She was seen for the same issue at this hospital yesterday.  LV  150/92 SPo2 95 CBG 136  t 97.2  P112

## 2021-11-05 NOTE — ED Provider Notes (Signed)
[facility] Provider Note   CSN: [id number] Arrival date & time: 11/05/21  2952     History {Add pertinent medical, surgical, social history, OB history to HPI:1} No chief complaint on file.   Tanya Koch is a 88 y.o. female.  HPI     Home Medications Prior to Admission medications   Medication Sig Start Date End Date Taking? Authorizing Provider  acetaminophen (TYLENOL) 325 MG tablet Take 650 mg by mouth every 6 (six) hours as needed for moderate pain.    [provider]  ALPRAZolam Duanne Moron) 0.25 MG tablet Take 0.25 mg by mouth 2 (two) times daily as needed for anxiety (aggitation). 11/01/21   [provider]  alum & mag hydroxide-simeth (MAALOX/MYLANTA) 200-200-20 MG/5ML suspension Take 30 mLs by mouth every 4 (four) hours as needed for indigestion or heartburn.    [provider]  aspirin 81 MG chewable tablet Chew 81 mg by mouth daily.    [provider]  calcium carbonate (TUMS - DOSED IN MG ELEMENTAL CALCIUM) 500 MG chewable tablet Chew 1,000 mg by mouth daily. Patient not taking: Reported on 11/03/2021    [provider]  calcium elemental as carbonate (BARIATRIC TUMS ULTRA) 400 MG chewable tablet Chew 1,000 mg by mouth daily.    [provider]  diphenhydrAMINE (BENADRYL) 25 MG tablet Take 0.5 tablets (12.5 mg total) by mouth every 6 (six) hours as needed. Patient taking differently: Take 12.5 mg by mouth every 6 (six) hours as needed (reason not listed on MAR). 09/22/21   Jacqlyn Larsen, PA-C  docusate sodium (COLACE) 100 MG capsule Take 100 mg by mouth 2 (two) times daily.    [provider]  escitalopram (LEXAPRO) 10 MG tablet Take 1 tablet (10 mg total) by mouth daily. 09/18/21   Nita Sells, MD  feeding supplement (ENSURE ENLIVE / ENSURE PLUS) LIQD Take 237 mLs by mouth 2 (two) times daily between meals. 09/13/21   Antonieta Pert, MD  ferrous sulfate 325 (65 FE) MG  tablet Take 325 mg by mouth daily with breakfast.    [provider]  folic acid (FOLVITE) 1 MG tablet Take 1 mg by mouth daily. 08/04/21   [provider]  furosemide (LASIX) 20 MG tablet Take 1 tablet (20 mg total) by mouth every Monday, Wednesday, and Friday. 11/04/21 03/24/22  Erskine Emery, MD  HYDROcodone-acetaminophen (NORCO) 5-325 MG tablet Take 1 tablet by mouth every 6 (six) hours as needed. Patient taking differently: Take 1 tablet by mouth every 6 (six) hours as needed (pain). 09/18/21   Nita Sells, MD  magnesium oxide (MAG-OX) 400 (240 Mg) MG tablet Take 400 mg by mouth 2 (two) times daily. 07/30/20   [provider]  metoprolol tartrate (LOPRESSOR) 25 MG tablet Take 0.5 tablets (12.5 mg total) by mouth 2 (two) times daily. 09/18/21   Nita Sells, MD  Mouthwashes (MOUTH RINSE) LIQD solution 15 mLs by Mouth Rinse route as needed (reason not listed on MAR).    [provider]  ondansetron (ZOFRAN) 4 MG tablet Take 1 tablet (4 mg total) by mouth every 8 (eight) hours as needed for nausea or vomiting. 09/18/21   Nita Sells, MD  pantoprazole (PROTONIX) 40 MG tablet Take 40 mg by mouth daily. 07/08/20   [provider]  polyethylene glycol (MIRALAX / GLYCOLAX) 17 g packet Take 17 g by mouth daily as needed for mild constipation. 09/13/21   Antonieta Pert, MD  PRESCRIPTION MEDICATION Take 3  mg by mouth. Menthol-Cetylpyridium lozenge    [provider]  rosuvastatin (CRESTOR) 10 MG tablet Take 10 mg by mouth at bedtime. 06/20/20   [provider]  vitamin B-12 (CYANOCOBALAMIN) 1000 MCG tablet Take 2,000 mcg by mouth daily.    [provider]  Zinc 30 MG TABS Take 30 mg by mouth daily.    [provider]      Allergies    Patient has no known allergies.    Review of Systems   Review of Systems  Physical Exam Updated Vital Signs BP (!) 149/94   Pulse (!) 101   Temp 97.8 F (36.6 C) (Oral)    Resp (!) 25   SpO2 97%  Physical Exam  ED Results / Procedures / Treatments   Labs (all labs ordered are listed, but only abnormal results are displayed) Labs Reviewed  CBC WITH DIFFERENTIAL/PLATELET - Abnormal; Notable for the following components:      Result Value   RBC 3.22 (*)    Hemoglobin 10.5 (*)    HCT 32.2 (*)    RDW 20.1 (*)    Monocytes Absolute 1.3 (*)    All other components within normal limits  COMPREHENSIVE METABOLIC PANEL - Abnormal; Notable for the following components:   CO2 20 (*)    Glucose, Bld 120 (*)    BUN 64 (*)    Creatinine, Ser 3.55 (*)    Calcium 8.4 (*)    Total Protein 6.1 (*)    Albumin 2.8 (*)    AST 618 (*)    ALT 485 (*)    GFR, Estimated 12 (*)    Anion gap 16 (*)    All other components within normal limits  BRAIN NATRIURETIC PEPTIDE - Abnormal; Notable for the following components:   B Natriuretic Peptide 3,870.2 (*)    All other components within normal limits  TROPONIN I (HIGH SENSITIVITY) - Abnormal; Notable for the following components:   Troponin I (High Sensitivity) 60 (*)    All other components within normal limits  TROPONIN I (HIGH SENSITIVITY)    EKG EKG Interpretation  Date/Time:  Saturday November 05 2021 03:28:19 EDT Ventricular Rate:  88 PR Interval:    QRS Duration: 149 QT Interval:  436 QTC Calculation: 528 R Axis:   237 Text Interpretation: Atrial fibrillation Anterior infarct, old Abnormal T, consider ischemia, lateral leads Prolonged QT interval Confirmed by Merrily Pew [phone]) on 11/05/2021 7:24:55 AM  Radiology DG Chest Portable 1 View  Result Date: 11/05/2021 CLINICAL DATA:  Shortness of breath. EXAM: PORTABLE CHEST 1 VIEW COMPARISON:  Two days ago FINDINGS: Cardiomegaly. Indistinct density at the bases with small pleural effusions. Congested appearance of hilar vessels. No pneumothorax. IMPRESSION: Unchanged lower chest opacification at least partially from pleural fluid and edema. There may also be  superimposed pneumonia. Electronically Signed   By: Jorje Guild M.D.   On: 11/05/2021 05:22   VAS [place] LOWER EXTREMITY VENOUS (DVT) (ONLY [facility] & [facility])  Result Date: 11/03/2021  Lower Venous DVT Study Patient Name:  Tanya Koch  Date of Exam:   11/03/2021 Medical Rec #: [id number]       Accession #:    [id number] Date of Birth: Jun 26, 1928       Patient Gender: F Patient Age:   70 years Exam Location:  [facility] Procedure:      VAS [place] LOWER EXTREMITY VENOUS (DVT) Referring Phys: Marda Stalker --------------------------------------------------------------------------------  Indications: Swelling, and Edema.  Comparison Study: no  prior Performing Technologist: Archie Patten RVS  Examination Guidelines: A complete evaluation includes B-mode imaging, spectral Doppler, color Doppler, and power Doppler as needed of all accessible portions of each vessel. Bilateral testing is considered an integral part of a complete examination. Limited examinations for reoccurring indications may be performed as noted. The reflux portion of the exam is performed with the patient in reverse Trendelenburg.  +-----+---------------+---------+-----------+----------+--------------+ RIGHTCompressibilityPhasicitySpontaneityPropertiesThrombus Aging +-----+---------------+---------+-----------+----------+--------------+ CFV  Full           Yes      Yes                                 +-----+---------------+---------+-----------+----------+--------------+   +---------+---------------+---------+-----------+----------+--------------+ LEFT     CompressibilityPhasicitySpontaneityPropertiesThrombus Aging +---------+---------------+---------+-----------+----------+--------------+ CFV      Full           Yes      Yes                                 +---------+---------------+---------+-----------+----------+--------------+ SFJ      Full                                                         +---------+---------------+---------+-----------+----------+--------------+ FV Prox  Full                                                        +---------+---------------+---------+-----------+----------+--------------+ FV Mid   Full                                                        +---------+---------------+---------+-----------+----------+--------------+ FV DistalFull                                                        +---------+---------------+---------+-----------+----------+--------------+ PFV      Full                                                        +---------+---------------+---------+-----------+----------+--------------+ POP      Full           Yes      Yes                                 +---------+---------------+---------+-----------+----------+--------------+ PTV      Full                                                        +---------+---------------+---------+-----------+----------+--------------+  PERO     Full                                                        +---------+---------------+---------+-----------+----------+--------------+     Summary: RIGHT: - No evidence of common femoral vein obstruction.  LEFT: - There is no evidence of deep vein thrombosis in the lower extremity.  - No cystic structure found in the popliteal fossa.  *See table(s) above for measurements and observations. Electronically signed by Jamelle Haring on 11/03/2021 at 4:23:16 PM.    Final    DG Chest 2 View  Result Date: 11/03/2021 CLINICAL DATA:  Shortness of breath, wheezing EXAM: CHEST - 2 VIEW COMPARISON:  09/08/2021 FINDINGS: Stable cardiomegaly. Aortic atherosclerosis. Patchy right lower lobe airspace opacity. Small right pleural effusion. Mild left basilar atelectasis. No pneumothorax. IMPRESSION: Patchy right lower lobe airspace opacity, could represent pneumonia or asymmetric edema. Small right pleural effusion. Electronically Signed   By:  Davina Poke D.O.   On: 11/03/2021 10:36    Procedures Procedures  {Document cardiac monitor, telemetry assessment procedure when appropriate:1}  Medications Ordered in ED Medications  furosemide (LASIX) injection 80 mg (has no administration in time range)    ED Course/ Medical Decision Making/ A&P                           Medical Decision Making Amount and/or Complexity of Data Reviewed Labs: ordered. Radiology: ordered.  Risk Prescription drug management.   ***  {Document critical care time when appropriate:1} {Document review of labs and clinical decision tools ie heart score, Chads2Vasc2 etc:1}  {Document your independent review of radiology images, and any outside records:1} {Document your discussion with family members, caretakers, and with consultants:1} {Document social determinants of health affecting pt's care:1} {Document your decision making why or why not admission, treatments were needed:1} Final Clinical Impression(s) / ED Diagnoses Final diagnoses:  None    Rx / DC Orders ED Discharge Orders     None

## 2021-11-05 NOTE — Consult Note (Signed)
Cardiology Consultation   Patient ID: Tanya Koch MRN: [id number]; DOB: 1928-09-10   Admission date: 11/05/2021  PCP:  Earmon Phoenix, NP   [facility] Prov[facility] Cardiologist:  Early Osmond, MD       Chief Complaint:  Heart failure with dementia.   Patient Profile:   Tanya Koch is a 88 y.o. female with CAD (Prior MI NOS in [place]) HFrEF (suspect tachy-mediated given prior recover 02/2021), Permanent AF non on AC due to dementia, CKD Stage IV (new to HD) with dementia (seen who is being seen 11/05/2021 for the evaluation of SOB.  History of Present Illness:   Tanya Koch notes that she is feeling fine.  She notes that she has no chest pain, shortness of breath or palpitations (she is in permanent atrial fibrillation).  She notes that she is [place].  She goes by [employer].  She had two sons.  She is happy that no one bothers her here.  Son recounts her history from the decision for no further ischemic procedural work up.  Reviewed the history of her chronic illines with the plans for no further procedural intervention (LHC, CRT-P or D), no plans for HD.  Notes that now the EF is worsened again he wanted to talk about what options were for his mother.    Past Medical History:  Diagnosis Date   Bradycardia    CHF (congestive heart failure) (HCC)    Closed right hip fracture, sequela 02/10/2021   COVID 12/06/2020   Emphysema, unspecified ([place])    Hematemesis 12/05/2020   History of MI (myocardial infarction)    Hypertension     Past Surgical History:  Procedure Laterality Date   HIP ARTHROPLASTY Right    TOTAL HIP ARTHROPLASTY Left 09/09/2021   Procedure: LEFT HIP HEMI ARTHROPLASTY;  Surgeon: Leandrew Koyanagi, MD;  Location: [facility];  Service: Orthopedics;  Laterality: Left;     Medications Prior to Admission: Prior to Admission medications   Medication Sig Start Date End Date Taking? Authorizing Provider  acetaminophen (TYLENOL) 325 MG tablet Take 650 mg by mouth every  6 (six) hours as needed for moderate pain.   Yes [provider]  ALPRAZolam (XANAX) 0.25 MG tablet Take 0.25 mg by mouth 2 (two) times daily as needed for anxiety (aggitation). 11/01/21  Yes [provider]  alum & mag hydroxide-simeth (MAALOX/MYLANTA) 200-200-20 MG/5ML suspension Take 30 mLs by mouth every 4 (four) hours as needed for indigestion or heartburn.   Yes [provider]  aspirin 81 MG chewable tablet Chew 81 mg by mouth daily.   Yes [provider]  calcium elemental as carbonate (BARIATRIC TUMS ULTRA) 400 MG chewable tablet Chew 1,000 mg by mouth daily.   Yes [provider]  diphenhydrAMINE (BENADRYL) 25 MG tablet Take 0.5 tablets (12.5 mg total) by mouth every 6 (six) hours as needed. Patient taking differently: Take 12.5 mg by mouth every 6 (six) hours as needed (reason not listed on MAR). 09/22/21  Yes Jacqlyn Larsen, PA-C  docusate sodium (COLACE) 100 MG capsule Take 100 mg by mouth 2 (two) times daily.   Yes [provider]  escitalopram (LEXAPRO) 10 MG tablet Take 1 tablet (10 mg total) by mouth daily. 09/18/21  Yes Nita Sells, MD  feeding supplement (ENSURE ENLIVE / ENSURE PLUS) LIQD Take 237 mLs by mouth 2 (two) times daily between meals. 09/13/21  Yes Antonieta Pert, MD  ferrous sulfate 325 (65 FE) MG tablet Take 325 mg  by mouth daily with breakfast.   Yes [provider]  folic acid (FOLVITE) 1 MG tablet Take 1 mg by mouth daily. 08/04/21  Yes [provider]  HYDROcodone-acetaminophen (NORCO) 5-325 MG tablet Take 1 tablet by mouth every 6 (six) hours as needed. Patient taking differently: Take 1 tablet by mouth every 6 (six) hours as needed (pain). 09/18/21  Yes Nita Sells, MD  magnesium oxide (MAG-OX) 400 (240 Mg) MG tablet Take 400 mg by mouth 2 (two) times daily. 07/30/20  Yes [provider]  metoprolol tartrate (LOPRESSOR) 25 MG tablet Take 0.5 tablets (12.5 mg total) by mouth 2 (two)  times daily. 09/18/21  Yes Nita Sells, MD  Mouthwashes (MOUTH RINSE) LIQD solution 15 mLs by Mouth Rinse route as needed (reason not listed on MAR).   Yes [provider]  ondansetron (ZOFRAN) 4 MG tablet Take 1 tablet (4 mg total) by mouth every 8 (eight) hours as needed for nausea or vomiting. 09/18/21  Yes Nita Sells, MD  pantoprazole (PROTONIX) 40 MG tablet Take 40 mg by mouth daily. 07/08/20  Yes [provider]  polyethylene glycol (MIRALAX / GLYCOLAX) 17 g packet Take 17 g by mouth daily as needed for mild constipation. 09/13/21  Yes Antonieta Pert, MD  PRESCRIPTION MEDICATION Take 3 mg by mouth. Menthol-Cetylpyridium lozenge   Yes [provider]  rosuvastatin (CRESTOR) 10 MG tablet Take 10 mg by mouth at bedtime. 06/20/20  Yes [provider]  vitamin B-12 (CYANOCOBALAMIN) 1000 MCG tablet Take 2,000 mcg by mouth daily.   Yes [provider]  Zinc 30 MG TABS Take 30 mg by mouth daily.   Yes [provider]  furosemide (LASIX) 20 MG tablet Take 1 tablet (20 mg total) by mouth every Monday, Wednesday, and Friday. 11/04/21 03/24/22  Erskine Emery, MD     Allergies:   No Known Allergies  Social History:   Social History   Socioeconomic History   Marital status: Widowed    Spouse name: Not on file   Number of children: Not on file   Years of education: Not on file   Highest education level: Not on file  Occupational History   Not on file  Tobacco Use   Smoking status: Former    Years: 50.00    Types: Cigarettes    Passive exposure: Never   Smokeless tobacco: Never  Substance and Sexual Activity   Alcohol use: Not Currently   Drug use: Never   Sexual activity: Not Currently  Other Topics Concern   Not on file  Social History Narrative   Not on file   Social Determinants of Health   Financial Resource Strain: Not on file  Food Insecurity: Not on file  Transportation Needs: Not on file  Physical Activity: Not  on file  Stress: Not on file  Social Connections: Not on file  Intimate Partner Violence: Not on file    Family History:   The patient's family history includes Hypertension in an other family member.    ROS:  Please see the history of present illness.  All other ROS reviewed and negative.     Physical Exam/Data:   Vitals:   11/05/21 1245 11/05/21 1315 11/05/21 1532 11/05/21 1603  BP:   (!) 142/88 91/75  Pulse: (!) 106 (!) 103 90   Resp: (!) 22 19 16 18   Temp:   97.9 F (36.6 C) (!) 97.5 F (36.4 C)  TempSrc:   Oral Oral  SpO2: 95% 98%  96% 95%  Weight:    60.5 kg  Height:    5\' 5"  (1.651 m)   No intake or output data in the 24 hours ending 11/05/21 1701    11/05/2021    4:03 PM 11/04/2021   12:00 AM 11/03/2021    9:50 AM  Last 3 Weights  Weight (lbs) 133 lb 6.1 oz 134 lb 11.2 oz 151 lb  Weight (kg) 60.5 kg 61.1 kg 68.493 kg     Body mass index is 22.2 kg/m.  General:  Well nourished, well developed, in no acute distress  HEENT: normal Vascular: No carotid bruits; Distal pulses 2+ bilaterally   Cardiac:  normal S1, S2; IRIR rhythm Lungs:  clear to auscultation bilaterally, no wheezing, rhonchi or rales  Abd: soft, nontender, no hepatomegaly  Ext: no edema Musculoskeletal:  No deformities, BUE and BLE strength normal and equal Skin: warm and dry  Neuro:  Alert to self Psych:  Normal affect    Ekg: A fib Echo EF 20-25% new from 02/2021 Echo  Laboratory Data:  High Sensitivity Troponin:   Recent Labs  Lab 11/03/21 1000 11/03/21 1424 11/05/21 0448 11/05/21 0700  TROPONINIHS 44* 39* 60* 60*      Chemistry Recent Labs  Lab 11/03/21 1000 11/03/21 1424 11/04/21 0543 11/05/21 0448  NA 140   < > 142 140  K 5.6*   < > 5.1 4.4  CL 108   < > 110 104  CO2 21*   < > 20* 20*  GLUCOSE 119*   < > 93 120*  BUN 44*   < > 52* 64*  CREATININE 2.79*   < > 3.02* 3.55*  CALCIUM 8.6*   < > 8.6* 8.4*  MG 2.3  --   --   --   GFRNONAA 16*   < > 14* 12*  ANIONGAP 11   < >  12 16*   < > = values in this interval not displayed.    Recent Labs  Lab 11/05/21 0448  PROT 6.1*  ALBUMIN 2.8*  AST 618*  ALT 485*  ALKPHOS 71  BILITOT 0.9   Lipids No results for input(s): "CHOL", "TRIG", "HDL", "LABVLDL", "LDLCALC", "CHOLHDL" in the last 168 hours. Hematology Recent Labs  Lab 11/03/21 1000 11/05/21 0448  WBC 8.1 8.2  RBC 3.23* 3.22*  HGB 10.5* 10.5*  HCT 32.7* 32.2*  MCV 101.2* 100.0  MCH 32.5 32.6  MCHC 32.1 32.6  RDW 20.2* 20.1*  PLT 201 210   Thyroid No results for input(s): "TSH", "FREET4" in the last 168 hours. BNP Recent Labs  Lab 11/03/21 1011 11/05/21 0448  BNP 2,876.4* 7,782.4*    DDimer No results for input(s): "DDIMER" in the last 168 hours.   Radiology/Studies:  ECHOCARDIOGRAM COMPLETE  Result Date: 11/05/2021    ECHOCARDIOGRAM REPORT   Patient Name:   PRESLEI BLAKLEY Date of Exam: 11/05/2021 Medical Rec #:  [id number]      Height:       65.0 in Accession #:    [id number]     Weight:       134.7 lb Date of Birth:  02-17-1929      BSA:          1.672 m Patient Age:    57 years       BP:           153/104 mmHg Patient Gender: F              HR:  93 bpm. Exam Location:  Inpatient Procedure: 2D Echo and Intracardiac Opacification Agent Indications:    Congestive Heart Failure I50.9  History:        Patient has prior history of Echocardiogram examinations, most                 recent 03/03/2021. Acute exacerbation of CHF, Previous Myocardial                 Infarction, Arrythmias:Atrial Fibrillation and Bradycardia; Risk                 Factors:Hypertension. Chronic kidney disease.  Sonographer:    Darlina Sicilian RDCS Referring Phys: [place]  1. Left ventricular ejection fraction, by estimation, is 20 to 25%. The left ventricle has moderately decreased function. The left ventricle demonstrates global hypokinesis. Left ventricular diastolic parameters are indeterminate.  2. Right ventricular systolic function is normal.  The right ventricular size is normal. There is moderately elevated pulmonary artery systolic pressure.  3. Left atrial size was mildly dilated.  4. Right atrial size was mildly dilated.  5. The mitral valve is abnormal. Mild mitral valve regurgitation. No evidence of mitral stenosis.  6. The aortic valve was not well visualized. There is mild calcification of the aortic valve. There is mild thickening of the aortic valve. Aortic valve regurgitation is not visualized. No aortic stenosis is present.  7. The inferior vena cava is normal in size with <50% respiratory variability, suggesting right atrial pressure of 8 mmHg. FINDINGS  Left Ventricle: Left ventricular ejection fraction, by estimation, is 20 to 25%. The left ventricle has moderately decreased function. The left ventricle demonstrates global hypokinesis. Definity contrast agent was given IV to delineate the left ventricular endocardial borders. The left ventricular internal cavity size was normal in size. There is no left ventricular hypertrophy. Left ventricular diastolic parameters are indeterminate. Right Ventricle: The right ventricular size is normal. Right vetricular wall thickness was not well visualized. Right ventricular systolic function is normal. There is moderately elevated pulmonary artery systolic pressure. The tricuspid regurgitant velocity is 3.36 m/s, and with an assumed right atrial pressure of 8 mmHg, the estimated right ventricular systolic pressure is 86.7 mmHg. Left Atrium: Left atrial size was mildly dilated. Right Atrium: Right atrial size was mildly dilated. Pericardium: There is no evidence of pericardial effusion. Mitral Valve: The mitral valve is abnormal. There is mild thickening of the mitral valve leaflet(s). There is mild calcification of the mitral valve leaflet(s). Mild mitral annular calcification. Mild mitral valve regurgitation. No evidence of mitral valve stenosis. Tricuspid Valve: The tricuspid valve is not well  visualized. Tricuspid valve regurgitation is mild . No evidence of tricuspid stenosis. Aortic Valve: LVOT and AV Dopplers likely underestimated due to angle of acquisition. The aortic valve was not well visualized. There is mild calcification of the aortic valve. There is mild thickening of the aortic valve. There is mild aortic valve annular calcification. Aortic valve regurgitation is not visualized. No aortic stenosis is present. Aortic valve mean gradient measures 1.9 mmHg. Aortic valve peak gradient measures 4.5 mmHg. Aortic valve area, by VTI measures 0.63 cm. Pulmonic Valve: The pulmonic valve was not well visualized. Pulmonic valve regurgitation is not visualized. No evidence of pulmonic stenosis. Aorta: The aortic root and ascending aorta are structurally normal, with no evidence of dilitation. Venous: The inferior vena cava is normal in size with less than 50% respiratory variability, suggesting right atrial pressure of 8 mmHg. IAS/Shunts: No atrial  level shunt detected by color flow Doppler.  LEFT VENTRICLE PLAX 2D LVIDd:         4.70 cm     Diastology LVIDs:         4.20 cm     LV e' medial:    4.03 cm/s LV PW:         1.10 cm     LV E/e' medial:  26.2 LV IVS:        1.00 cm     LV e' lateral:   4.35 cm/s LVOT diam:     1.70 cm     LV E/e' lateral: 24.2 LV SV:         11 LV SV Index:   7 LVOT Area:     2.27 cm  LV Volumes (MOD) LV vol d, MOD A2C: 99.2 ml LV vol d, MOD A4C: 98.4 ml LV vol s, MOD A2C: 80.8 ml LV vol s, MOD A4C: 78.5 ml LV SV MOD A2C:     18.4 ml LV SV MOD A4C:     98.4 ml LV SV MOD BP:      20.8 ml RIGHT VENTRICLE RV S prime:     7.09 cm/s TAPSE (M-mode): 0.8 cm LEFT ATRIUM             Index        RIGHT ATRIUM           Index LA diam:        4.20 cm 2.51 cm/m   RA Area:     18.10 cm LA Vol (A2C):   52.3 ml 31.28 ml/m  RA Volume:   51.20 ml  30.62 ml/m LA Vol (A4C):   63.4 ml 37.91 ml/m LA Biplane Vol: 61.2 ml 36.60 ml/m  AORTIC VALVE AV Area (Vmax):    0.72 cm AV Area (Vmean):    0.75 cm AV Area (VTI):     0.63 cm AV Vmax:           105.87 cm/s AV Vmean:          64.198 cm/s AV VTI:            0.174 m AV Peak Grad:      4.5 mmHg AV Mean Grad:      1.9 mmHg LVOT Vmax:         33.80 cm/s LVOT Vmean:        21.267 cm/s LVOT VTI:          0.048 m LVOT/AV VTI ratio: 0.28  AORTA Ao Root diam: 2.80 cm Ao Asc diam:  2.70 cm MITRAL VALVE                  TRICUSPID VALVE MV Area (PHT): 7.70 cm       TR Peak grad:   45.2 mmHg MV Decel Time: 99 msec        TR Vmax:        336.00 cm/s MR Peak grad:    94.1 mmHg MR Mean grad:    65.0 mmHg    SHUNTS MR Vmax:         485.00 cm/s  Systemic VTI:  0.05 m MR Vmean:        383.0 cm/s   Systemic Diam: 1.70 cm MR PISA:         1.01 cm MR PISA Eff ROA: 8 mm MR PISA Radius:  0.40 cm MV E velocity: 105.50 cm/s Carlyle Dolly MD Electronically signed  by Carlyle Dolly MD Signature Date/Time: 11/05/2021/12:34:30 PM    Final    US Abdomen Limited RUQ (LIVER/GB)  Result Date: 11/05/2021 CLINICAL DATA:  Transaminitis EXAM: ULTRASOUND ABDOMEN LIMITED RIGHT UPPER QUADRANT COMPARISON:  None Available. FINDINGS: Gallbladder: Multiple gallstones, largest measures stone measuring 2.2 cm. No gallbladder wall thickening. No sonographic Murphy's sign was elicited during the exam, per the sonographer. Common bile duct: Diameter: 5 mm.  No intrahepatic bile duct dilatation. Liver: Liver echotexture is coarsened and the peripheral liver contours are nodular consistent with underlying cirrhosis. No focal liver abnormality is identified. Portal vein is patent on color Doppler imaging with normal direction of blood flow towards the liver. Other: Small amount of ascites in the RIGHT upper quadrant. IMPRESSION: 1. Cholelithiasis without evidence of acute cholecystitis. 2. No bile duct dilatation. 3. Cirrhotic-appearing liver. 4. Small amount of ascites in the RIGHT upper quadrant. Electronically Signed   By: Franki Cabot M.D.   On: 11/05/2021 10:47   DG Chest Portable 1  View  Result Date: 11/05/2021 CLINICAL DATA:  Shortness of breath. EXAM: PORTABLE CHEST 1 VIEW COMPARISON:  Two days ago FINDINGS: Cardiomegaly. Indistinct density at the bases with small pleural effusions. Congested appearance of hilar vessels. No pneumothorax. IMPRESSION: Unchanged lower chest opacification at least partially from pleural fluid and edema. There may also be superimposed pneumonia. Electronically Signed   By: Jorje Guild M.D.   On: 11/05/2021 05:22     Assessment and Plan:   CAD- anatomy unclear COPD NOS HTN HFrEF- suspect tachycardia mediated; NYHA 1, Stage C, hypervolemic CKD Stage IV Permanent AF CHADSVASC 5 on no AC due to dementia Dementia- AOX1 at baseline - had a long discussion with son and DIL:  Her four comorbiditys of AF, HF, CKD, and dementia all worsen one another - not a procedural candidate - ARNI, SGLT2i, and MRA may worsen her CKD; she does not appears to be a HD candidate - can give lasix 40 IV; can give metoprolol succinate from her prior tartrate; I do not think this will make an appreciate difference long term - Patient notes that she likes to be left alone; and distress with the blood draw. After long discussion my recommendation is for inpatient palliative care evaluation: to transition to comfort care and non morphine derivative opioids for shortness of breath. - if this is pursued; ASA, BB, and statin could be discontinued. - family is not against this; they see outpatient PC, for now will not make these changes, but have discussed this option with primary   For questions or updates, please contact [facility] Please consult [email or web address] for contact info under   Rudean Haskell, MD [place]  [address] [place], [place] [address] [phone]  5:27 PM

## 2021-11-05 NOTE — ED Notes (Signed)
PT attempted to get out of bed.  Unable to articulate what she needed, but was assisted to bedside commode.  BM x 1.  Assisted back into bed and given lunch.   Placed at nursing station for better observation.

## 2021-11-06 DIAGNOSIS — E872 Acidosis, unspecified: Secondary | ICD-10-CM | POA: Diagnosis present

## 2021-11-06 DIAGNOSIS — R627 Adult failure to thrive: Secondary | ICD-10-CM | POA: Diagnosis present

## 2021-11-06 DIAGNOSIS — J439 Emphysema, unspecified: Secondary | ICD-10-CM | POA: Diagnosis present

## 2021-11-06 DIAGNOSIS — S72001A Fracture of unspecified part of neck of right femur, initial encounter for closed fracture: Secondary | ICD-10-CM | POA: Diagnosis present

## 2021-11-06 DIAGNOSIS — I959 Hypotension, unspecified: Secondary | ICD-10-CM | POA: Diagnosis present

## 2021-11-06 DIAGNOSIS — L8990 Pressure ulcer of unspecified site, unspecified stage: Secondary | ICD-10-CM | POA: Insufficient documentation

## 2021-11-06 DIAGNOSIS — G309 Alzheimer's disease, unspecified: Secondary | ICD-10-CM | POA: Diagnosis present

## 2021-11-06 DIAGNOSIS — I080 Rheumatic disorders of both mitral and aortic valves: Secondary | ICD-10-CM | POA: Diagnosis present

## 2021-11-06 DIAGNOSIS — I428 Other cardiomyopathies: Secondary | ICD-10-CM | POA: Diagnosis present

## 2021-11-06 DIAGNOSIS — I130 Hypertensive heart and chronic kidney disease with heart failure and stage 1 through stage 4 chronic kidney disease, or unspecified chronic kidney disease: Secondary | ICD-10-CM | POA: Diagnosis present

## 2021-11-06 DIAGNOSIS — D631 Anemia in chronic kidney disease: Secondary | ICD-10-CM | POA: Diagnosis present

## 2021-11-06 DIAGNOSIS — I5043 Acute on chronic combined systolic (congestive) and diastolic (congestive) heart failure: Secondary | ICD-10-CM

## 2021-11-06 DIAGNOSIS — I2510 Atherosclerotic heart disease of native coronary artery without angina pectoris: Secondary | ICD-10-CM | POA: Diagnosis present

## 2021-11-06 DIAGNOSIS — E875 Hyperkalemia: Secondary | ICD-10-CM | POA: Diagnosis present

## 2021-11-06 DIAGNOSIS — L89151 Pressure ulcer of sacral region, stage 1: Secondary | ICD-10-CM | POA: Diagnosis present

## 2021-11-06 DIAGNOSIS — Z7189 Other specified counseling: Secondary | ICD-10-CM | POA: Diagnosis not present

## 2021-11-06 DIAGNOSIS — I4891 Unspecified atrial fibrillation: Secondary | ICD-10-CM

## 2021-11-06 DIAGNOSIS — N184 Chronic kidney disease, stage 4 (severe): Secondary | ICD-10-CM | POA: Diagnosis present

## 2021-11-06 DIAGNOSIS — F0390 Unspecified dementia without behavioral disturbance: Secondary | ICD-10-CM | POA: Diagnosis not present

## 2021-11-06 DIAGNOSIS — I5023 Acute on chronic systolic (congestive) heart failure: Secondary | ICD-10-CM | POA: Diagnosis present

## 2021-11-06 DIAGNOSIS — Z66 Do not resuscitate: Secondary | ICD-10-CM | POA: Diagnosis present

## 2021-11-06 DIAGNOSIS — R188 Other ascites: Secondary | ICD-10-CM | POA: Diagnosis present

## 2021-11-06 DIAGNOSIS — F419 Anxiety disorder, unspecified: Secondary | ICD-10-CM

## 2021-11-06 DIAGNOSIS — H9190 Unspecified hearing loss, unspecified ear: Secondary | ICD-10-CM | POA: Diagnosis present

## 2021-11-06 DIAGNOSIS — Z515 Encounter for palliative care: Secondary | ICD-10-CM | POA: Diagnosis not present

## 2021-11-06 DIAGNOSIS — I4821 Permanent atrial fibrillation: Secondary | ICD-10-CM | POA: Diagnosis present

## 2021-11-06 DIAGNOSIS — I472 Ventricular tachycardia, unspecified: Secondary | ICD-10-CM | POA: Diagnosis not present

## 2021-11-06 DIAGNOSIS — Z8616 Personal history of COVID-19: Secondary | ICD-10-CM | POA: Diagnosis not present

## 2021-11-06 DIAGNOSIS — E785 Hyperlipidemia, unspecified: Secondary | ICD-10-CM | POA: Diagnosis present

## 2021-11-06 DIAGNOSIS — F02C4 Dementia in other diseases classified elsewhere, severe, with anxiety: Secondary | ICD-10-CM | POA: Diagnosis present

## 2021-11-06 LAB — COMPREHENSIVE METABOLIC PANEL
ALT: 365 U/L — ABNORMAL HIGH (ref 0–44)
AST: 295 U/L — ABNORMAL HIGH (ref 15–41)
Albumin: 2.6 g/dL — ABNORMAL LOW (ref 3.5–5.0)
Alkaline Phosphatase: 79 U/L (ref 38–126)
Anion gap: 12 (ref 5–15)
BUN: 67 mg/dL — ABNORMAL HIGH (ref 8–23)
CO2: 23 mmol/L (ref 22–32)
Calcium: 8.3 mg/dL — ABNORMAL LOW (ref 8.9–10.3)
Chloride: 106 mmol/L (ref 98–111)
Creatinine, Ser: 3.47 mg/dL — ABNORMAL HIGH (ref 0.44–1.00)
GFR, Estimated: 12 mL/min — ABNORMAL LOW (ref >60)
Glucose, Bld: 105 mg/dL — ABNORMAL HIGH (ref 70–99)
Potassium: 3.5 mmol/L (ref 3.5–5.1)
Sodium: 141 mmol/L (ref 135–145)
Total Bilirubin: 0.8 mg/dL (ref 0.3–1.2)
Total Protein: 5.7 g/dL — ABNORMAL LOW (ref 6.5–8.1)

## 2021-11-06 LAB — CBC WITH DIFFERENTIAL/PLATELET
Abs Immature Granulocytes: 0.03 10*3/uL (ref 0.00–0.07)
Basophils Absolute: 0.1 10*3/uL (ref 0.0–0.1)
Basophils Relative: 1 %
Eosinophils Absolute: 0.4 10*3/uL (ref 0.0–0.5)
Eosinophils Relative: 4 %
HCT: 31 % — ABNORMAL LOW (ref 36.0–46.0)
Hemoglobin: 10.2 g/dL — ABNORMAL LOW (ref 12.0–15.0)
Immature Granulocytes: 0 %
Lymphocytes Relative: 18 %
Lymphs Abs: 1.6 10*3/uL (ref 0.7–4.0)
MCH: 32.5 pg (ref 26.0–34.0)
MCHC: 32.9 g/dL (ref 30.0–36.0)
MCV: 98.7 fL (ref 80.0–100.0)
Monocytes Absolute: 1.4 10*3/uL — ABNORMAL HIGH (ref 0.1–1.0)
Monocytes Relative: 17 %
Neutro Abs: 5.1 10*3/uL (ref 1.7–7.7)
Neutrophils Relative %: 60 %
Platelets: 196 10*3/uL (ref 150–400)
RBC: 3.14 MIL/uL — ABNORMAL LOW (ref 3.87–5.11)
RDW: 20.2 % — ABNORMAL HIGH (ref 11.5–15.5)
WBC: 8.6 10*3/uL (ref 4.0–10.5)
nRBC: 0 % (ref 0.0–0.2)

## 2021-11-06 LAB — MAGNESIUM: Magnesium: 2.2 mg/dL (ref 1.7–2.4)

## 2021-11-06 MED ORDER — POTASSIUM CHLORIDE 20 MEQ PO PACK
40.0000 meq | PACK | Freq: Two times a day (BID) | ORAL | Status: DC
  Administered 2021-11-06 – 2021-11-08 (×5): 40 meq via ORAL
  Filled 2021-11-06 (×5): qty 2

## 2021-11-06 MED ORDER — METOPROLOL SUCCINATE ER 50 MG PO TB24
50.0000 mg | ORAL_TABLET | Freq: Every day | ORAL | Status: DC
  Administered 2021-11-06 – 2021-11-08 (×3): 50 mg via ORAL
  Filled 2021-11-06 (×3): qty 1

## 2021-11-06 NOTE — Assessment & Plan Note (Signed)
Monitor swallowing, aspiration precautions

## 2021-11-06 NOTE — Assessment & Plan Note (Signed)
Partially preventing GDMT, monitor daily while diuresing

## 2021-11-06 NOTE — Assessment & Plan Note (Signed)
History of hypothermia, external heating as needed

## 2021-11-06 NOTE — Assessment & Plan Note (Signed)
Stable, repeat CBC in AM

## 2021-11-06 NOTE — Assessment & Plan Note (Signed)
Stable and healing, PT/OT to see

## 2021-11-06 NOTE — Assessment & Plan Note (Signed)
Reported by nursing, Stage 1 sacral ulcer - Turn q2h

## 2021-11-06 NOTE — Hospital Course (Signed)
Tanya Koch is a 88 y.o.female with a history of dementia, CKD IV, HFpEF, atrial fibrillation, dementia living in a memory care unit who was admitted to the [facility] at [facility] for dyspnea. Her hospital course is detailed below:  HFrEF Exacerbation NYHA III, Stage C Bilateral pleural effusions Patient admitted with shortness of breath without hypoxia or oxygen requirement. An echocardiogram showed decreased from previous 20-25%. CXR noted small bilateral pleural effusions. Cardiology was consulted for assistance and patient was diuresed with IV Lasix 40 mg Bid and was able to transition to oral lasix 40mg  daily upon discharge. Patient had diuresis of >3L during the admission.   Goals of Care  Dementia Given concern for patient to continue having admissions due to anxiety and respiratory shortness of breath, consulted with palliative care. Patient was transitioned to hospice care during the admission and will be followed by hospice outpatient. Hospital bed was delivered to memory care unit and further medications for comfort will be determined by hospice care. Unnecessary home medications were discontinued upon discharge.   Chronic kidney disease (CKD), stage IV (severe) (HCC) Patient with elevated creatinine upon discharge that mildly improved after getting Lasix. Upon discharge, patient's creatinine was 3.38 with GFR of 12.  Anxiety Patient's anxiety regarding her breathing status and shortness of breath contributed to patient's readmission. Patient was transitioned from PRN Xanax to Ativan 0.25mg  BID so that she would not be overly sedated with the lower potency and dose. Patient's home lexapro was continued.    Other chronic conditions were medically managed with home medications and formulary alternatives as necessary

## 2021-11-06 NOTE — Assessment & Plan Note (Addendum)
SOB and increased WOB happen in isolated episodes as a mixture of fluid in lungs and anxiety   - Switch to PO lasix 40 mg   - monitor oxygen need, currently on RA without tachypnea  - Appreciate cardiology recommendations - Monitor I/Os and kidney function

## 2021-11-06 NOTE — Progress Notes (Signed)
Daily Progress Note Intern Pager: [phone]  Patient name: Tanya Koch Medical record number: [id number] Date of birth: 01-23-1929 Age: 88 y.o. Gender: female  Primary Care Provider: Earmon Phoenix, NP Consultants: Cardiology  Code Status: DNR/DNI   Pt Overview and Major Events to Date:  Admitted to [facility] 11/05/2021   Assessment and Plan:  Tanya Koch is a 88 y.o. female presenting with SOB and elevated HR. Differential for this patient's presentation of this includes systolic CHF exacerbation with Afib (most likely based on CXR findings), aspiration PNA (due to dysphagia history, although unlikely with afebrile and no white count), PE (given left hip replacement in July but unlikely given [place] 1.5), COPD exacerbation (given heavy smoking history with no true COPD dx).  * Acute exacerbation of CHF (congestive heart failure) (HCC) HFrEF Exacerbation NYHA III, Stg C Bilateral pleural effusions Cardiology involved and recommended palliative discussions with family. Given age, comorbidities, patient is poor procedural candidate. Afib with worsened EF (20-25%) from previous, possibly with relation to difficulty obtaining true EF while in Afib. No AC moving forward. Will consider consult palliative inpatient for further discussions, although respect family decisions to defer hospice care at this time. Will continue with Lasix for hypervolemia IV and could transition to PO diuresis once Cr 4 (arbitrary number given by cardiology). Appreciate cardiology assistance.  - Continue diuresis - monitor oxygen need, currently on RA without tachypnea  - Appreciate cardiology recommendations - Monitor I/Os and kidney function  Chronic kidney disease (CKD), stage IV (severe) (HCC) Partially preventing GDMT, monitor daily while diuresing   Pressure injury of skin Needs to be assessed, reported by nursing   Elevated transaminase level Downtrending transaminase levels with diuresis, may benefit  from repeat imaging when optimized diuresis. Prothrombin/INR elevated. Monitor symptoms and recheck CMP   Anxiety Continue with Xanax and Lexapro   Dysphagia Monitor swallowing, aspiration precautions   Atrial fibrillation (HCC) Rate controlled today, continue diuresis and metoprolol   Normocytic anemia Stable, repeat CBC in AM   Hypothermia History of hypothermia, external heating as needed   Closed right hip fracture, sequela Stable and healing, PT/OT to see   Dementia without behavioral disturbance ([place]) Pleasantly demented patient without change from baseline as we know her. Continue with Xanax and Lexapro    H/o CAD on ASA  HLD on Crestor  GERD on PPI    FEN/GI: DYS 3 PPx: Hep SQ  Dispo: Memory Care unit at [facility]  pending clinical improvement . Barriers include palliative discussions and diuresis to optimize patient.   Subjective:  Patient resting comfortably and arousing to stimuli. No family at bedside.   Objective: Temp:  [97.5 F (36.4 C)-97.9 F (36.6 C)] 97.5 F (36.4 C) (09/10 0804) Pulse Rate:  [79-106] 89 (09/10 0804) Resp:  [16-28] 17 (09/10 0804) BP: (91-154)/(75-105) 117/89 (09/10 0804) SpO2:  [93 %-100 %] 93 % (09/10 0804) Weight:  [60.5 kg-62.2 kg] 62.2 kg (09/10 0034) General: Arouses to stimuli, no family at bedside, elderly patient  Heart: Regular rate and irregular rhythm with no murmurs appreciated Lungs: CTA bilaterally, no wheezing, crackles, focal diminishment  Abdomen: Bowel sounds present, no abdominal pain Skin: Warm and dry Extremities: No lower extremity edema   Laboratory: Most recent CBC Lab Results  Component Value Date   WBC 8.6 11/06/2021   HGB 10.2 (L) 11/06/2021   HCT 31.0 (L) 11/06/2021   MCV 98.7 11/06/2021   PLT 196 11/06/2021   Most recent BMP    Latest Ref  Rng & Units 11/06/2021    4:37 AM  BMP  Glucose 70 - 99 mg/dL 105   BUN 8 - 23 mg/dL 67   Creatinine 0.44 - 1.00 mg/dL 3.47   Sodium 135 -  145 mmol/L 141   Potassium 3.5 - 5.1 mmol/L 3.5   Chloride 98 - 111 mmol/L 106   CO2 22 - 32 mmol/L 23   Calcium 8.9 - 10.3 mg/dL 8.3     Erskine Emery, MD 11/06/2021, 9:22 AM  PGY-2, [place] Intern pager: [phone], text pages welcome Secure chat group [place]

## 2021-11-06 NOTE — Progress Notes (Addendum)
Progress Note  Patient Name: Tanya Koch Date of Encounter: 11/06/2021  Primary Cardiologist: Early Osmond, MD   Subjective   Overnight unclear diuresis. Patient notes that she is feeling fine.  No CP, SOB, Palpitations. Had planned to meet with family 8:30 AM but was unable to meet them  Inpatient Medications    Scheduled Meds:  aspirin  81 mg Oral Daily   docusate sodium  100 mg Oral BID   escitalopram  10 mg Oral Daily   feeding supplement  237 mL Oral BID BM   ferrous sulfate  325 mg Oral Q breakfast   folic acid  1 mg Oral Daily   furosemide  40 mg Intravenous BID   heparin  5,000 Units Subcutaneous Q8H   magnesium oxide  400 mg Oral BID   metoprolol succinate  25 mg Oral Daily   pantoprazole  40 mg Oral Daily   rosuvastatin  10 mg Oral QHS   Continuous Infusions:  PRN Meds: ALPRAZolam, polyethylene glycol   Vital Signs    Vitals:   11/06/21 0034 11/06/21 0200 11/06/21 0510 11/06/21 0804  BP: (!) 144/97  (!) 143/105 117/89  Pulse: 88  88 89  Resp: (!) 28 20 (!) 22 17  Temp: (!) 97.5 F (36.4 C)  (!) 97.5 F (36.4 C) (!) 97.5 F (36.4 C)  TempSrc: Oral  Oral Oral  SpO2: 95%   93%  Weight: 62.2 kg     Height:       No intake or output data in the 24 hours ending 11/06/21 0815 Filed Weights   11/05/21 1603 11/06/21 0034  Weight: 60.5 kg 62.2 kg    Telemetry    One episode of NSVT; otherwise AF  - Personally Reviewed  Physical Exam   Gen: No distress, Elderly   Cardiac: No Rubs or Gallops, No Murmur, IRIR +2 radial pulses Respiratory: Clear to auscultation bilaterally, normal effort, normal  respiratory rate on my exam but documenated as higher with nursing team GI: Soft, nontender, non-distended  MS: No edema;  moves all extremities Integument: Skin feels warm Neuro:  At time of evaluation, alert and oriented to person  Psych: Pleasant artifact  Labs    Chemistry Recent Labs  Lab 11/04/21 0543 11/05/21 0448 11/06/21 0437  NA  142 140 141  K 5.1 4.4 3.5  CL 110 104 106  CO2 20* 20* 23  GLUCOSE 93 120* 105*  BUN 52* 64* 67*  CREATININE 3.02* 3.55* 3.47*  CALCIUM 8.6* 8.4* 8.3*  PROT  --  6.1* 5.7*  ALBUMIN  --  2.8* 2.6*  AST  --  618* 295*  ALT  --  485* 365*  ALKPHOS  --  71 79  BILITOT  --  0.9 0.8  GFRNONAA 14* 12* 12*  ANIONGAP 12 16* 12     Hematology Recent Labs  Lab 11/03/21 1000 11/05/21 0448 11/06/21 0437  WBC 8.1 8.2 8.6  RBC 3.23* 3.22* 3.14*  HGB 10.5* 10.5* 10.2*  HCT 32.7* 32.2* 31.0*  MCV 101.2* 100.0 98.7  MCH 32.5 32.6 32.5  MCHC 32.1 32.6 32.9  RDW 20.2* 20.1* 20.2*  PLT 201 210 196    Cardiac EnzymesNo results for input(s): "TROPONINI" in the last 168 hours. No results for input(s): "TROPIPOC" in the last 168 hours.   BNP Recent Labs  Lab 11/03/21 1011 11/05/21 0448  BNP 2,876.4* 9,326.7*     DDimer No results for input(s): "DDIMER" in the last 168 hours.  Radiology    ECHOCARDIOGRAM COMPLETE  Result Date: 11/05/2021    ECHOCARDIOGRAM REPORT   Patient Name:   Tanya Koch Date of Exam: 11/05/2021 Medical Rec #:  [id number]      Height:       65.0 in Accession #:    [id number]     Weight:       134.7 lb Date of Birth:  11/12/1928      BSA:          1.672 m Patient Age:    27 years       BP:           153/104 mmHg Patient Gender: F              HR:           93 bpm. Exam Location:  Inpatient Procedure: 2D Echo and Intracardiac Opacification Agent Indications:    Congestive Heart Failure I50.9  History:        Patient has prior history of Echocardiogram examinations, most                 recent 03/03/2021. Acute exacerbation of CHF, Previous Myocardial                 Infarction, Arrythmias:Atrial Fibrillation and Bradycardia; Risk                 Factors:Hypertension. Chronic kidney disease.  Sonographer:    Darlina Sicilian RDCS Referring Phys: [place]  1. Left ventricular ejection fraction, by estimation, is 20 to 25%. The left ventricle has  moderately decreased function. The left ventricle demonstrates global hypokinesis. Left ventricular diastolic parameters are indeterminate.  2. Right ventricular systolic function is normal. The right ventricular size is normal. There is moderately elevated pulmonary artery systolic pressure.  3. Left atrial size was mildly dilated.  4. Right atrial size was mildly dilated.  5. The mitral valve is abnormal. Mild mitral valve regurgitation. No evidence of mitral stenosis.  6. The aortic valve was not well visualized. There is mild calcification of the aortic valve. There is mild thickening of the aortic valve. Aortic valve regurgitation is not visualized. No aortic stenosis is present.  7. The inferior vena cava is normal in size with <50% respiratory variability, suggesting right atrial pressure of 8 mmHg. FINDINGS  Left Ventricle: Left ventricular ejection fraction, by estimation, is 20 to 25%. The left ventricle has moderately decreased function. The left ventricle demonstrates global hypokinesis. Definity contrast agent was given IV to delineate the left ventricular endocardial borders. The left ventricular internal cavity size was normal in size. There is no left ventricular hypertrophy. Left ventricular diastolic parameters are indeterminate. Right Ventricle: The right ventricular size is normal. Right vetricular wall thickness was not well visualized. Right ventricular systolic function is normal. There is moderately elevated pulmonary artery systolic pressure. The tricuspid regurgitant velocity is 3.36 m/s, and with an assumed right atrial pressure of 8 mmHg, the estimated right ventricular systolic pressure is 81.0 mmHg. Left Atrium: Left atrial size was mildly dilated. Right Atrium: Right atrial size was mildly dilated. Pericardium: There is no evidence of pericardial effusion. Mitral Valve: The mitral valve is abnormal. There is mild thickening of the mitral valve leaflet(s). There is mild calcification of  the mitral valve leaflet(s). Mild mitral annular calcification. Mild mitral valve regurgitation. No evidence of mitral valve stenosis. Tricuspid Valve: The tricuspid valve is not well visualized. Tricuspid valve regurgitation is mild . No  evidence of tricuspid stenosis. Aortic Valve: LVOT and AV Dopplers likely underestimated due to angle of acquisition. The aortic valve was not well visualized. There is mild calcification of the aortic valve. There is mild thickening of the aortic valve. There is mild aortic valve annular calcification. Aortic valve regurgitation is not visualized. No aortic stenosis is present. Aortic valve mean gradient measures 1.9 mmHg. Aortic valve peak gradient measures 4.5 mmHg. Aortic valve area, by VTI measures 0.63 cm. Pulmonic Valve: The pulmonic valve was not well visualized. Pulmonic valve regurgitation is not visualized. No evidence of pulmonic stenosis. Aorta: The aortic root and ascending aorta are structurally normal, with no evidence of dilitation. Venous: The inferior vena cava is normal in size with less than 50% respiratory variability, suggesting right atrial pressure of 8 mmHg. IAS/Shunts: No atrial level shunt detected by color flow Doppler.  LEFT VENTRICLE PLAX 2D LVIDd:         4.70 cm     Diastology LVIDs:         4.20 cm     LV e' medial:    4.03 cm/s LV PW:         1.10 cm     LV E/e' medial:  26.2 LV IVS:        1.00 cm     LV e' lateral:   4.35 cm/s LVOT diam:     1.70 cm     LV E/e' lateral: 24.2 LV SV:         11 LV SV Index:   7 LVOT Area:     2.27 cm  LV Volumes (MOD) LV vol d, MOD A2C: 99.2 ml LV vol d, MOD A4C: 98.4 ml LV vol s, MOD A2C: 80.8 ml LV vol s, MOD A4C: 78.5 ml LV SV MOD A2C:     18.4 ml LV SV MOD A4C:     98.4 ml LV SV MOD BP:      20.8 ml RIGHT VENTRICLE RV S prime:     7.09 cm/s TAPSE (M-mode): 0.8 cm LEFT ATRIUM             Index        RIGHT ATRIUM           Index LA diam:        4.20 cm 2.51 cm/m   RA Area:     18.10 cm LA Vol (A2C):   52.3  ml 31.28 ml/m  RA Volume:   51.20 ml  30.62 ml/m LA Vol (A4C):   63.4 ml 37.91 ml/m LA Biplane Vol: 61.2 ml 36.60 ml/m  AORTIC VALVE AV Area (Vmax):    0.72 cm AV Area (Vmean):   0.75 cm AV Area (VTI):     0.63 cm AV Vmax:           105.87 cm/s AV Vmean:          64.198 cm/s AV VTI:            0.174 m AV Peak Grad:      4.5 mmHg AV Mean Grad:      1.9 mmHg LVOT Vmax:         33.80 cm/s LVOT Vmean:        21.267 cm/s LVOT VTI:          0.048 m LVOT/AV VTI ratio: 0.28  AORTA Ao Root diam: 2.80 cm Ao Asc diam:  2.70 cm MITRAL VALVE  TRICUSPID VALVE MV Area (PHT): 7.70 cm       TR Peak grad:   45.2 mmHg MV Decel Time: 99 msec        TR Vmax:        336.00 cm/s MR Peak grad:    94.1 mmHg MR Mean grad:    65.0 mmHg    SHUNTS MR Vmax:         485.00 cm/s  Systemic VTI:  0.05 m MR Vmean:        383.0 cm/s   Systemic Diam: 1.70 cm MR PISA:         1.01 cm MR PISA Eff ROA: 8 mm MR PISA Radius:  0.40 cm MV E velocity: 105.50 cm/s Carlyle Dolly MD Electronically signed by Carlyle Dolly MD Signature Date/Time: 11/05/2021/12:34:30 PM    Final    US Abdomen Limited RUQ (LIVER/GB)  Result Date: 11/05/2021 CLINICAL DATA:  Transaminitis EXAM: ULTRASOUND ABDOMEN LIMITED RIGHT UPPER QUADRANT COMPARISON:  None Available. FINDINGS: Gallbladder: Multiple gallstones, largest measures stone measuring 2.2 cm. No gallbladder wall thickening. No sonographic Murphy's sign was elicited during the exam, per the sonographer. Common bile duct: Diameter: 5 mm.  No intrahepatic bile duct dilatation. Liver: Liver echotexture is coarsened and the peripheral liver contours are nodular consistent with underlying cirrhosis. No focal liver abnormality is identified. Portal vein is patent on color Doppler imaging with normal direction of blood flow towards the liver. Other: Small amount of ascites in the RIGHT upper quadrant. IMPRESSION: 1. Cholelithiasis without evidence of acute cholecystitis. 2. No bile duct dilatation. 3.  Cirrhotic-appearing liver. 4. Small amount of ascites in the RIGHT upper quadrant. Electronically Signed   By: Franki Cabot M.D.   On: 11/05/2021 10:47   DG Chest Portable 1 View  Result Date: 11/05/2021 CLINICAL DATA:  Shortness of breath. EXAM: PORTABLE CHEST 1 VIEW COMPARISON:  Two days ago FINDINGS: Cardiomegaly. Indistinct density at the bases with small pleural effusions. Congested appearance of hilar vessels. No pneumothorax. IMPRESSION: Unchanged lower chest opacification at least partially from pleural fluid and edema. There may also be superimposed pneumonia. Electronically Signed   By: Jorje Guild M.D.   On: 11/05/2021 05:22     Patient Profile     88 y.o. female with HF and dementia  Assessment & Plan    CAD- anatomy unclear COPD NOS HTN HFrEF- suspect tachycardia mediated; NYHA 1, Stage C, hypervolemic CKD Stage IV Permanent AF CHADSVASC 5 on no AC due to dementia Dementia- AOX1 at baseline - planned for lasix 40 IV BID; when creatinine reaches 4 would consider backing off to PO (this is an arbitraty number) - Missed family today, had a long discussion about limited option 11/05/21; may not be able to meet with them today - see consult note from 11/05/21: not a procedural candidate, no plans for [facility] - ARNI, SGLT2i, and MRA may worsen her CKD - given episode of NSVT increase metoprolol succinate dose to 50 mg PO daily; repleting K of 3.5 - PC consultation may be appropriate    For questions or updates, please contact [facility] Please consult [email or web address] for contact info under Cardiology/STEMI.      Rudean Haskell, MD [address] [place], [place] [address] [phone]  8:15 AM  Called patient's son to answer questions. Reviewed Kidney function. Discussed with family; I think hospice would be reasonable.  Potentially PC discussion on Monday.  Doryce Mcgregory  Gasper Sells, MD  [address] [place], [place] [address] [phone]  11:17 AM

## 2021-11-06 NOTE — Progress Notes (Signed)
OT Cancellation Note  Patient Details Name: Trang Bouse MRN: [id number] DOB: 1928-12-05   Cancelled Treatment:    Reason Eval/Treat Not Completed: OT screened, no needs identified, will sign off Discussed status of pt with PT who consulted the pt's family. Per family report- Pt is at her baseline for mobility and Self care which is dependent residing at a facility, and family is in agreement with no therapy at this time. OT will sign off  [facility] OTR/L acute rehab services Office: [place] 11/06/2021, 12:08 PM

## 2021-11-06 NOTE — Assessment & Plan Note (Signed)
Continue with Xanax and Lexapro

## 2021-11-06 NOTE — Assessment & Plan Note (Signed)
Rate controlled today, continue diuresis and metoprolol

## 2021-11-06 NOTE — Progress Notes (Signed)
PT Cancellation Note  Patient Details Name: Tanya Koch MRN: [id number] DOB: 1928-08-09   Cancelled Treatment:    Reason Eval/Treat Not Completed: PT screened, no needs identified, will sign off. Spoke with family. Pt is at her baseline for mobility which is dependent with transfers and w/c bound. They agree no PT needed.    Shary Decamp [facility] 11/06/2021, 11:05 AM [place] Office [phone]

## 2021-11-06 NOTE — Assessment & Plan Note (Signed)
Downtrending transaminase levels with diuresis, may benefit from repeat imaging when optimized diuresis. Prothrombin/INR elevated. Monitor symptoms and recheck CMP

## 2021-11-06 NOTE — Assessment & Plan Note (Addendum)
Pleasantly demented patient without change from baseline as we know her. Continue with Xanax and Lexapro

## 2021-11-07 DIAGNOSIS — F419 Anxiety disorder, unspecified: Secondary | ICD-10-CM | POA: Diagnosis not present

## 2021-11-07 DIAGNOSIS — N184 Chronic kidney disease, stage 4 (severe): Secondary | ICD-10-CM | POA: Diagnosis not present

## 2021-11-07 DIAGNOSIS — I4821 Permanent atrial fibrillation: Secondary | ICD-10-CM

## 2021-11-07 DIAGNOSIS — I5023 Acute on chronic systolic (congestive) heart failure: Secondary | ICD-10-CM

## 2021-11-07 DIAGNOSIS — I5043 Acute on chronic combined systolic (congestive) and diastolic (congestive) heart failure: Secondary | ICD-10-CM | POA: Diagnosis not present

## 2021-11-07 DIAGNOSIS — Z7189 Other specified counseling: Secondary | ICD-10-CM | POA: Diagnosis not present

## 2021-11-07 DIAGNOSIS — Z515 Encounter for palliative care: Secondary | ICD-10-CM | POA: Diagnosis not present

## 2021-11-07 LAB — CBC
HCT: 34.4 % — ABNORMAL LOW (ref 36.0–46.0)
Hemoglobin: 11.1 g/dL — ABNORMAL LOW (ref 12.0–15.0)
MCH: 32.3 pg (ref 26.0–34.0)
MCHC: 32.3 g/dL (ref 30.0–36.0)
MCV: 100 fL (ref 80.0–100.0)
Platelets: 195 10*3/uL (ref 150–400)
RBC: 3.44 MIL/uL — ABNORMAL LOW (ref 3.87–5.11)
RDW: 20.6 % — ABNORMAL HIGH (ref 11.5–15.5)
WBC: 8.6 10*3/uL (ref 4.0–10.5)
nRBC: 0 % (ref 0.0–0.2)

## 2021-11-07 LAB — COMPREHENSIVE METABOLIC PANEL
ALT: 285 U/L — ABNORMAL HIGH (ref 0–44)
AST: 167 U/L — ABNORMAL HIGH (ref 15–41)
Albumin: 2.6 g/dL — ABNORMAL LOW (ref 3.5–5.0)
Alkaline Phosphatase: 70 U/L (ref 38–126)
Anion gap: 10 (ref 5–15)
BUN: 65 mg/dL — ABNORMAL HIGH (ref 8–23)
CO2: 25 mmol/L (ref 22–32)
Calcium: 8.5 mg/dL — ABNORMAL LOW (ref 8.9–10.3)
Chloride: 105 mmol/L (ref 98–111)
Creatinine, Ser: 3.38 mg/dL — ABNORMAL HIGH (ref 0.44–1.00)
GFR, Estimated: 12 mL/min — ABNORMAL LOW (ref >60)
Glucose, Bld: 95 mg/dL (ref 70–99)
Potassium: 4.4 mmol/L (ref 3.5–5.1)
Sodium: 140 mmol/L (ref 135–145)
Total Bilirubin: 0.8 mg/dL (ref 0.3–1.2)
Total Protein: 5.8 g/dL — ABNORMAL LOW (ref 6.5–8.1)

## 2021-11-07 MED ORDER — LORAZEPAM 0.5 MG PO TABS
0.2500 mg | ORAL_TABLET | Freq: Two times a day (BID) | ORAL | Status: DC
  Administered 2021-11-07 – 2021-11-08 (×3): 0.25 mg via ORAL
  Filled 2021-11-07 (×3): qty 1

## 2021-11-07 NOTE — Consult Note (Signed)
Palliative Medicine Inpatient Consult Note  Consulting Provider: Erskine Emery, MD  Reason for consult:   [facility] Palliative Medicine Consult  Reason for Consult? 88 yo F with comorbidities and in need of EOL care. Patient family amenable to hospice discussions, struggles with hypervolemia causing dyspnea and discomfort. Currently recieving diuresis and monitoring kidney function   11/07/2021  HPI:  Per intake H&P -->   Tanya Koch is a 88 y.o. female presenting with SOB and elevated HR. Differential for this patient's presentation of this includes systolic CHF exacerbation with Afib (most likely based on CXR findings), aspiration PNA (due to dysphagia history, although unlikely with afebrile and no white count), PE (given left hip replacement in July but unlikely given Wells 1.5), COPD exacerbation (given heavy smoking history with no true COPD dx).  Palliative care has been asked to get involved in the setting of worsening heart failure to discuss goals of care and hospice. Patient is followed by the OP Palliative care team through [facility] though has not been see since 02/23/2021.  Clinical Assessment/Goals of Care:  *Please note that this is a verbal dictation therefore any spelling or grammatical errors are due to the "[place]" system interpretation.  I have reviewed medical records including EPIC notes, labs and imaging, received report from bedside RN, assessed the patient who is pleasant - utilizing a sound device to hear.    I met with patient's son Tanya Koch and daughter-in-law Tanya Koch to further discuss diagnosis prognosis, GOC, EOL wishes, disposition and options.   I introduced Palliative Medicine as specialized medical care for people living with serious illness. It focuses on providing relief from the symptoms and stress of a serious illness. The goal is to improve quality of life for both the patient and the family.  Medical History Review  and Understanding:  We reviewed patient's history of CKD, COPD, Alzheimer's dementia, pulmonary embolism, CHF, atrial fibrillation, and recent left hip fracture.  Patient is hard of hearing but has a sound amplifying device present with her.  Social History:  Tanya Koch is from [place] originally.  She has 2 sons 1 of whom is disabled in [place].  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  She used to enjoy going with friends to see plays in the city as well as "smoking her cigarettes".  Per her family she has been fairly sedentary for the last 20+ years.  She was raised as a Nurse, learning disability.  Functional and Nutritional State:  [facility] for the past 6 months or so.  She is predominantly wheelchair-bound though can self propel to some degree.  She has a fair appetite.  Advance Directives:  A detailed discussion was had today regarding advanced directives.  Patient's son Tanya Koch is her surrogate decision-maker  Code Status:  Patient has an established do not attempt resuscitation/DO NOT INTUBATE CODE STATUS.  Discussion:  We reviewed [name] present health state in the setting of recurring rehospitalization's.    Discussed patients heart failure, kidney disease, and dementia. Reviewed how we cannot cure any of these and we are very likely to see continued cycles of illness/re-hospitalization in the setting of these.   We discussed that she has had a decline since her hip fracture and repair.  The chronic disease trajectory was described to patient family in terms of one event leading to another event causing rehospitalization's and decline in mental and functional faculties.  Discussed per  family they would ideally not want her to continually be rehospitalized as each rehospitalization patient has a significant effect on her.  We reviewed the options in the setting of her severe heart failure from here reviewed the different alternatives  to care in the outside inclusive of outpatient palliative care and hospice care.  I described hospice as a service for patients who have a life expectancy of 6 months or less. The goal of hospice is the preservation of dignity and quality at the end phases of life. Under hospice care, the focus changes from curative to symptom relief. Patient son feels that this would likely be the best avenue to travel for his mother as he has witnessed her decline and does not desire that the continue to go through more aggressive medical interventions.   Discussed the importance of continued conversation with family and their  medical providers regarding overall plan of care and treatment options, ensuring decisions are within the context of the patients values and GOCs.  Decision Maker: Tanya Koch, Tanya Koch [phone]   [phone]     SUMMARY OF RECOMMENDATIONS   DNAR/DNI  Patients family is interested in the pursuit of hospice care given patients ongoing clinical decline and recurring re-hospitalizations  Plan for transition back to [place] with [facility] hospice  Ongoing Palliative Support  Code Status/Advance Care Planning: DNAR/DNI   Palliative Prophylaxis:  Aspiration, Bowel Regimen, Delirium Protocol, Frequent Pain Assessment, Oral Care, Palliative Wound Care, and Turn Reposition  Additional Recommendations (Limitations, Scope, Preferences): Continue present scope of care  Psycho-social/Spiritual:  Desire for further Chaplaincy support: Not presently Additional Recommendations: Education on progressive disease (heart failure)   Prognosis: Limited to 6 months - Hospice is a reasonable consideration ay this point  Discharge Planning: Discharge to [place] with Hospice care  Vitals:   11/07/21 0004 11/07/21 0535  BP: (!) 146/85 131/83  Pulse: 80 91  Resp: 20 20  Temp: (!) 97.4 F (36.3 C) (!) 97.4 F (36.3 C)  SpO2: 92% 94%    Intake/Output Summary (Last 24 hours) at  11/07/2021 0631 Last data filed at 11/07/2021 0500 Gross per 24 hour  Intake 237 ml  Output 2850 ml  Net -2613 ml   Last Weight  Most recent update: 11/07/2021  5:10 AM    Weight  58.4 kg (128 lb 12 oz)            Gen:  Elderly Caucasian F in NAD HEENT: moist mucous membranes CV: Regular rate and rhythm PULM: On RA - breathing is even and nonlabored ABD: soft/nontender EXT: No edema Neuro: Alert and oriented to self and place  PPS: 40-50%   This conversation/these recommendations were discussed with patient primary care team, Dr. Nori Riis  Billing based on MDM: High  Problems Addressed: One acute or chronic illness or injury that poses a threat to life or bodily function  Amount and/or Complexity of Data: Category 3:Discussion of management or test interpretation with external physician/other qualified health care professional/appropriate source (not separately reported)  Risks: Decision regarding hospitalization or escalation of hospital care and Decision not to resuscitate or to de-escalate care because of poor prognosis ______________________________________________________ [facility] Team Team Cell Phone: [phone] Please utilize secure chat with additional questions, if there is no response within 30 minutes please call the above phone number  Palliative Medicine Team providers are available by phone from 7am to 7pm daily and can be reached through the team cell phone.  Should this patient require  assistance outside of these hours, please call the patient's attending physician.

## 2021-11-07 NOTE — Progress Notes (Signed)
[facility]) Hospital Liaison: RN note    Notified by Transition of Care Manger of patient/family request for [facility] services at [facility] after discharge. Chart and patient information under review by [facility] physician. Eligibility confirmed.  Writer spoke with  Gershon Mussel to initiate education related to hospice philosophy, services and team approach to care. Tom verbalized understanding of information given. Per discussion, plan is for discharge to [facility] by [place].     Please send signed and completed DNR form home with patient/family. Patient will need prescriptions for discharge comfort medications.     DME needs have been discussed, patient currently has the following equipment in the facility: none.  Patient/family requests the following DME for delivery to the home: Hospital bed, bedside table.  [facility] equipment manager has been notified and will contact DME provider to arrange delivery to the facility. We are trying to get it delivered today but this liaison will update team once we know for sure, prior to discharge.      [facility] aware of the above. Please notify [facility] when patient is ready to leave the unit at discharge. (Call [phone] or [phone] after 5pm.) [facility] information and contact numbers given to [facility].       Please call with any hospice related questions.     Thank you for this referral.     Clementeen Hoof, DNP, [facility] (listed on AMION under Hospice and [facility] [place]  [phone]

## 2021-11-07 NOTE — Progress Notes (Signed)
Progress Note  Patient Name: Tanya Koch Date of Encounter: 11/07/2021  Primary Cardiologist: Early Osmond, MD   Subjective   No complaints.   Inpatient Medications    Scheduled Meds:  aspirin  81 mg Oral Daily   docusate sodium  100 mg Oral BID   escitalopram  10 mg Oral Daily   feeding supplement  237 mL Oral BID BM   ferrous sulfate  325 mg Oral Q breakfast   folic acid  1 mg Oral Daily   furosemide  40 mg Intravenous BID   heparin  5,000 Units Subcutaneous Q8H   magnesium oxide  400 mg Oral BID   metoprolol succinate  50 mg Oral Daily   pantoprazole  40 mg Oral Daily   potassium chloride  40 mEq Oral BID   rosuvastatin  10 mg Oral QHS   Continuous Infusions:  PRN Meds: ALPRAZolam, polyethylene glycol   Vital Signs    Vitals:   11/07/21 0004 11/07/21 0500 11/07/21 0535 11/07/21 0740  BP: (!) 146/85  131/83 122/78  Pulse: 80  91 91  Resp: 20  20 19   Temp: (!) 97.4 F (36.3 C)  (!) 97.4 F (36.3 C) (!) 97.2 F (36.2 C)  TempSrc: Oral  Oral Oral  SpO2: 92%  94% 94%  Weight:  58.4 kg    Height:        Intake/Output Summary (Last 24 hours) at 11/07/2021 0835 Last data filed at 11/07/2021 0500 Gross per 24 hour  Intake 237 ml  Output 2350 ml  Net -2113 ml   Filed Weights   11/05/21 1603 11/06/21 0034 11/07/21 0500  Weight: 60.5 kg 62.2 kg 58.4 kg    Telemetry    Atrial fib - Personally Reviewed  Physical Exam   General: Well developed, well nourished, NAD  HEENT: OP clear, mucus membranes moist  SKIN: warm, dry. No rashes. Neuro: No focal deficits  Musculoskeletal: Muscle strength 5/5 all ext  Psychiatric: Mood and affect normal  Neck: No JVD, no carotid bruits, no thyromegaly, no lymphadenopathy.  Lungs:Clear bilaterally, no wheezes, rhonci, crackles Cardiovascular: Regular rate and rhythm. No murmurs, gallops or rubs. Abdomen:Soft. Bowel sounds present. Non-tender.  Extremities: No lower extremity edema. Pulses are 2 + in the  bilateral DP/PT.  Labs    Chemistry Recent Labs  Lab 11/05/21 0448 11/06/21 0437 11/07/21 0420  NA 140 141 140  K 4.4 3.5 4.4  CL 104 106 105  CO2 20* 23 25  GLUCOSE 120* 105* 95  BUN 64* 67* 65*  CREATININE 3.55* 3.47* 3.38*  CALCIUM 8.4* 8.3* 8.5*  PROT 6.1* 5.7* 5.8*  ALBUMIN 2.8* 2.6* 2.6*  AST 618* 295* 167*  ALT 485* 365* 285*  ALKPHOS 71 79 70  BILITOT 0.9 0.8 0.8  GFRNONAA 12* 12* 12*  ANIONGAP 16* 12 10     Hematology Recent Labs  Lab 11/05/21 0448 11/06/21 0437 11/07/21 0420  WBC 8.2 8.6 8.6  RBC 3.22* 3.14* 3.44*  HGB 10.5* 10.2* 11.1*  HCT 32.2* 31.0* 34.4*  MCV 100.0 98.7 100.0  MCH 32.6 32.5 32.3  MCHC 32.6 32.9 32.3  RDW 20.1* 20.2* 20.6*  PLT 210 196 195    Cardiac EnzymesNo results for input(s): "TROPONINI" in the last 168 hours. No results for input(s): "TROPIPOC" in the last 168 hours.   BNP Recent Labs  Lab 11/03/21 1011 11/05/21 0448  BNP 4,008.6* 7,619.5*     DDimer No results for input(s): "DDIMER" in the last  168 hours.   Radiology    ECHOCARDIOGRAM COMPLETE  Result Date: 11/05/2021    ECHOCARDIOGRAM REPORT   Patient Name:   Tanya Koch Date of Exam: 11/05/2021 Medical Rec #:  [id number]      Height:       65.0 in Accession #:    [id number]     Weight:       134.7 lb Date of Birth:  01-25-1929      BSA:          1.672 m Patient Age:    7 years       BP:           153/104 mmHg Patient Gender: F              HR:           93 bpm. Exam Location:  Inpatient Procedure: 2D Echo and Intracardiac Opacification Agent Indications:    Congestive Heart Failure I50.9  History:        Patient has prior history of Echocardiogram examinations, most                 recent 03/03/2021. Acute exacerbation of CHF, Previous Myocardial                 Infarction, Arrythmias:Atrial Fibrillation and Bradycardia; Risk                 Factors:Hypertension. Chronic kidney disease.  Sonographer:    Darlina Sicilian RDCS Referring Phys: [place]  1. Left ventricular ejection fraction, by estimation, is 20 to 25%. The left ventricle has moderately decreased function. The left ventricle demonstrates global hypokinesis. Left ventricular diastolic parameters are indeterminate.  2. Right ventricular systolic function is normal. The right ventricular size is normal. There is moderately elevated pulmonary artery systolic pressure.  3. Left atrial size was mildly dilated.  4. Right atrial size was mildly dilated.  5. The mitral valve is abnormal. Mild mitral valve regurgitation. No evidence of mitral stenosis.  6. The aortic valve was not well visualized. There is mild calcification of the aortic valve. There is mild thickening of the aortic valve. Aortic valve regurgitation is not visualized. No aortic stenosis is present.  7. The inferior vena cava is normal in size with <50% respiratory variability, suggesting right atrial pressure of 8 mmHg. FINDINGS  Left Ventricle: Left ventricular ejection fraction, by estimation, is 20 to 25%. The left ventricle has moderately decreased function. The left ventricle demonstrates global hypokinesis. Definity contrast agent was given IV to delineate the left ventricular endocardial borders. The left ventricular internal cavity size was normal in size. There is no left ventricular hypertrophy. Left ventricular diastolic parameters are indeterminate. Right Ventricle: The right ventricular size is normal. Right vetricular wall thickness was not well visualized. Right ventricular systolic function is normal. There is moderately elevated pulmonary artery systolic pressure. The tricuspid regurgitant velocity is 3.36 m/s, and with an assumed right atrial pressure of 8 mmHg, the estimated right ventricular systolic pressure is 86.5 mmHg. Left Atrium: Left atrial size was mildly dilated. Right Atrium: Right atrial size was mildly dilated. Pericardium: There is no evidence of pericardial effusion. Mitral Valve: The mitral  valve is abnormal. There is mild thickening of the mitral valve leaflet(s). There is mild calcification of the mitral valve leaflet(s). Mild mitral annular calcification. Mild mitral valve regurgitation. No evidence of mitral valve stenosis. Tricuspid Valve: The tricuspid valve is not well visualized. Tricuspid valve regurgitation  is mild . No evidence of tricuspid stenosis. Aortic Valve: LVOT and AV Dopplers likely underestimated due to angle of acquisition. The aortic valve was not well visualized. There is mild calcification of the aortic valve. There is mild thickening of the aortic valve. There is mild aortic valve annular calcification. Aortic valve regurgitation is not visualized. No aortic stenosis is present. Aortic valve mean gradient measures 1.9 mmHg. Aortic valve peak gradient measures 4.5 mmHg. Aortic valve area, by VTI measures 0.63 cm. Pulmonic Valve: The pulmonic valve was not well visualized. Pulmonic valve regurgitation is not visualized. No evidence of pulmonic stenosis. Aorta: The aortic root and ascending aorta are structurally normal, with no evidence of dilitation. Venous: The inferior vena cava is normal in size with less than 50% respiratory variability, suggesting right atrial pressure of 8 mmHg. IAS/Shunts: No atrial level shunt detected by color flow Doppler.  LEFT VENTRICLE PLAX 2D LVIDd:         4.70 cm     Diastology LVIDs:         4.20 cm     LV e' medial:    4.03 cm/s LV PW:         1.10 cm     LV E/e' medial:  26.2 LV IVS:        1.00 cm     LV e' lateral:   4.35 cm/s LVOT diam:     1.70 cm     LV E/e' lateral: 24.2 LV SV:         11 LV SV Index:   7 LVOT Area:     2.27 cm  LV Volumes (MOD) LV vol d, MOD A2C: 99.2 ml LV vol d, MOD A4C: 98.4 ml LV vol s, MOD A2C: 80.8 ml LV vol s, MOD A4C: 78.5 ml LV SV MOD A2C:     18.4 ml LV SV MOD A4C:     98.4 ml LV SV MOD BP:      20.8 ml RIGHT VENTRICLE RV S prime:     7.09 cm/s TAPSE (M-mode): 0.8 cm LEFT ATRIUM             Index         RIGHT ATRIUM           Index LA diam:        4.20 cm 2.51 cm/m   RA Area:     18.10 cm LA Vol (A2C):   52.3 ml 31.28 ml/m  RA Volume:   51.20 ml  30.62 ml/m LA Vol (A4C):   63.4 ml 37.91 ml/m LA Biplane Vol: 61.2 ml 36.60 ml/m  AORTIC VALVE AV Area (Vmax):    0.72 cm AV Area (Vmean):   0.75 cm AV Area (VTI):     0.63 cm AV Vmax:           105.87 cm/s AV Vmean:          64.198 cm/s AV VTI:            0.174 m AV Peak Grad:      4.5 mmHg AV Mean Grad:      1.9 mmHg LVOT Vmax:         33.80 cm/s LVOT Vmean:        21.267 cm/s LVOT VTI:          0.048 m LVOT/AV VTI ratio: 0.28  AORTA Ao Root diam: 2.80 cm Ao Asc diam:  2.70 cm MITRAL VALVE  TRICUSPID VALVE MV Area (PHT): 7.70 cm       TR Peak grad:   45.2 mmHg MV Decel Time: 99 msec        TR Vmax:        336.00 cm/s MR Peak grad:    94.1 mmHg MR Mean grad:    65.0 mmHg    SHUNTS MR Vmax:         485.00 cm/s  Systemic VTI:  0.05 m MR Vmean:        383.0 cm/s   Systemic Diam: 1.70 cm MR PISA:         1.01 cm MR PISA Eff ROA: 8 mm MR PISA Radius:  0.40 cm MV E velocity: 105.50 cm/s Carlyle Dolly MD Electronically signed by Carlyle Dolly MD Signature Date/Time: 11/05/2021/12:34:30 PM    Final    US Abdomen Limited RUQ (LIVER/GB)  Result Date: 11/05/2021 CLINICAL DATA:  Transaminitis EXAM: ULTRASOUND ABDOMEN LIMITED RIGHT UPPER QUADRANT COMPARISON:  None Available. FINDINGS: Gallbladder: Multiple gallstones, largest measures stone measuring 2.2 cm. No gallbladder wall thickening. No sonographic Murphy's sign was elicited during the exam, per the sonographer. Common bile duct: Diameter: 5 mm.  No intrahepatic bile duct dilatation. Liver: Liver echotexture is coarsened and the peripheral liver contours are nodular consistent with underlying cirrhosis. No focal liver abnormality is identified. Portal vein is patent on color Doppler imaging with normal direction of blood flow towards the liver. Other: Small amount of ascites in the RIGHT upper  quadrant. IMPRESSION: 1. Cholelithiasis without evidence of acute cholecystitis. 2. No bile duct dilatation. 3. Cirrhotic-appearing liver. 4. Small amount of ascites in the RIGHT upper quadrant. Electronically Signed   By: Franki Cabot M.D.   On: 11/05/2021 10:47     Patient Profile     88 y.o. female with CAD (Prior MI in [place]) HFrEF (suspect tachy-mediated given prior recover 02/2021), Permanent atrial fib not on anti-coagulation due to dementia, CKD Stage IV, with dementia who was seen for the evaluation of dyspnea and found to have CHF.   Assessment & Plan    Non-ischemic cardiomyopathy/Acute on chronic Systolic CHF: She has had no recent ischemic testing but is not felt to be a candidate for invasive testing. Her cardiomyopathy is suspected to be tachycardia mediated. Marland Kitchen She was admitted with volume overload but has diuresed well. She is down 2 kg and 2.6 liters since admission.  -Continue IV Lasix today and then change to oral Lasix tomorrow. -No plans to add ARNI, SGLT2i, and MRA, all of which may worsen her CKD  Atrial fib, permanent: Rate controlled on beta blocker. She is not a candidate for long term anti-coagulation given dementia and fall risk.   I spoke to her son this am at the bedside.     For questions or updates, please contact [facility] Please consult [email or web address] for contact info under Cardiology/STEMI.   Gennette Pac, [facility] 11/07/2021 8:35 AM

## 2021-11-07 NOTE — Progress Notes (Addendum)
Daily Progress Note Intern Pager: [phone]  Patient name: Tanya Koch Medical record number: [id number] Date of birth: [birth year] Age: 88 y.o. Gender: female  Primary Care Provider: Belva Agee, NP Consultants: Cardiology Code Status: DNR/DNI  Pt Overview and Major Events to Date:  Admitted to FMTS 11/05/21   Assessment and Plan:  Tanya Koch is a 88 y.o. female presenting with SOB and elevated HR, most likely due to systolic CHF exacerbation with Afib based on CXR findings.  * Acute exacerbation of CHF (congestive heart failure) (HCC) Breathing has significantly improved, hasn't required oxygen since admission. Currently being diuresed with 2L output since admission.  - Continue Lasix 40 IV, transition to PO tomorrow due to Cr  - monitor oxygen need, currently on RA without tachypnea  - Appreciate cardiology recommendations - Monitor I/Os and kidney function  Chronic kidney disease (CKD), stage IV (severe) (HCC) Partially preventing GDMT (ARNI, SGLT2i, and MRA), monitor daily while diuresing   Pressure injury of skin Reported by nursing  - Turn q2h  - Assess for wounds on 9/12   Elevated transaminase level Downtrending transaminase levels with diuresis, may benefit from repeat imaging when optimized diuresis. Prothrombin/INR elevated. Monitor symptoms and recheck CMP   Anxiety Continue with Xanax and Lexapro   Dysphagia Monitor swallowing, aspiration precautions   Atrial fibrillation (HCC) Rate controlled today, continue diuresis and metoprolol   Normocytic anemia Stable, repeat CBC in AM   Hypothermia History of hypothermia, external heating as needed   Closed right hip fracture, sequela Stable and healing, PT/OT to see   Dementia without behavioral disturbance ([name]) Pleasantly demented patient without change from baseline as we know her.  - Switch to Ativan 0.25 mg BID and continue Lexapro  - Fall precautions and delirium precautions     FEN/GI: DYS 3  PPx: Hep SQ Dispo: Memory Care unit at [facility]  pending clinical improvement . Barriers include palliative discussions and diuresis to optimize patient.   Subjective:  Met with patient at bedside, son and daughter-in-law present. Patient looks well this morning, denies feeling any pain or discomfort anywhere. She complains a bruise on her right antecubital fossa, likely due to IV insertion attempts. Family is in ongoing discussions with cards and palliative and asked what the difference between hospice and palliative is. They would like to be told in very plain terms what her prognosis is and what would be best for her. We discussed how she is in a cycle of heart failure causing fluid overload in lungs and SOB, causing her to be anxious and then spirals with SOB/anxiety attacks, which is what is bringing her back to the hospital. We are balancing diuresis with kidney failure. They were appreciative of explanations.   Objective: Temp:  [97.2 F (36.2 C)-97.7 F (36.5 C)] 97.3 F (36.3 C) (09/11 1146) Pulse Rate:  [71-91] 80 (09/11 1146) Resp:  [18-20] 18 (09/11 1146) BP: (117-146)/(78-108) 139/89 (09/11 1146) SpO2:  [92 %-100 %] 100 % (09/11 1146) Weight:  [58.4 kg] 58.4 kg (09/11 0500)  Physical Exam: General: alert, pleasantly demented, ate breakfast, non-ill or toxic appearing  Cardiovascular: RRR S1 S2 present. No rubs, murmurs, or gallops.  Respiratory: CTAB. No increased WOB and air movement even bilaterally. Chest rise and fall symmetric bilaterally.  Abdomen: Soft, nontender, no hepatosplenomegaly  Extremities: Bruise on right antecubital fossa, Pitting edema minimal in bilateral LE   Laboratory: Most recent CBC Lab Results  Component Value Date   WBC 8.6 11/07/2021  HGB 11.1 (L) 11/07/2021   HCT 34.4 (L) 11/07/2021   MCV 100.0 11/07/2021   PLT 195 11/07/2021   Most recent BMP    Latest Ref Rng & Units 11/07/2021    4:20 AM  BMP  Glucose 70 -  99 mg/dL 95   BUN 8 - 23 mg/dL 65   Creatinine 0.44 - 1.00 mg/dL 3.38   Sodium 135 - 145 mmol/L 140   Potassium 3.5 - 5.1 mmol/L 4.4   Chloride 98 - 111 mmol/L 105   CO2 22 - 32 mmol/L 25   Calcium 8.9 - 10.3 mg/dL 8.5     None new, elevated PT/INR and cirrhotic-appearing liver with ascites on RUQ   [name], Medical Student 11/07/2021, 11:06 AM [place] Intern pager: [phone], text pages welcome Secure chat group [place] Upper-Level Resident Addendum   I have independently interviewed and examined the patient. I have discussed the above with the original author and agree with their documentation. My edits for correction/addition/clarification are in within the document. Please see also any attending notes.   Rise Patience, DO  PGY-3, [facility] 11/07/2021 12:23 PM  FPTS Service pager: [phone] (text pages welcome through [facility])

## 2021-11-07 NOTE — Plan of Care (Signed)
  Problem: Elimination: Goal: Will not experience complications related to urinary retention Outcome: Progressing   Problem: Safety: Goal: Ability to remain free from injury will improve Outcome: Progressing   

## 2021-11-07 NOTE — TOC Initial Note (Addendum)
Transition of Care [facility]) - Initial/Assessment Note    Patient Details  Name: Tanya Koch MRN: [id number] Date of Birth: 07-29-1928  [facility] CM/SW Contact:    Bethann Berkshire, [place] Phone Number: 11/07/2021, 12:18 PM  Clinical Narrative:                  CSW is notified by PMT that family interested in pt returning to [facility] with hospice. Pt is active with OP palliative with [facility]. CSW called pt's son Gershon Mussel and confirmed this plan. CSW notified [facility] liaison. They will review pt and assess needs for DME at Memory care.   1425: Hospital bed to be delivered at [facility] this afternoon. Plan for DC tomorrow. DC summary and fl2 will need to be faxed to facility at [phone]  Expected Discharge Plan: Memory Care Barriers to Discharge:  (arranging hospice)   Patient Goals and CMS Choice        Expected Discharge Plan and Services Expected Discharge Plan: Memory Care                                              Prior Living Arrangements/Services   Lives with:: Facility Resident          Need for Family Participation in Patient Care: Yes (Comment) Care giver support system in place?: Yes (comment)      Activities of Daily Living   ADL Screening (condition at time of admission) Patient's cognitive ability adequate to safely complete daily activities?: No Is the patient deaf or have difficulty hearing?: Yes Does the patient have difficulty seeing, even when wearing glasses/contacts?: No Does the patient have difficulty concentrating, remembering, or making decisions?: Yes Patient able to express need for assistance with ADLs?: No Does the patient have difficulty dressing or bathing?: Yes Independently performs ADLs?: No Does the patient have difficulty walking or climbing stairs?: Yes Weakness of Legs: Both Weakness of Arms/Hands: Both  Permission Sought/Granted                  Emotional Assessment        Orientation: : Oriented to Self      Admission diagnosis:  Acute exacerbation of CHF (congestive heart failure) ([place]) [I50.9] Patient Active Problem List   Diagnosis Date Noted   Pressure injury of skin 11/06/2021   Elevated transaminase level 11/05/2021   Anxiety 11/04/2021   Acute exacerbation of CHF (congestive heart failure) ([place]) 11/03/2021   Atrial fibrillation ([place]) 11/03/2021   Dysphagia 11/03/2021   Closed left hip fracture, initial encounter ([place]) 09/08/2021   Hypertension 09/08/2021   Emphysema lung ([place]) 09/08/2021   Hyperlipidemia 09/08/2021   Hypothermia 09/08/2021   Normocytic anemia 09/08/2021   Leukocytosis 09/08/2021   Closed right hip fracture, sequela 02/10/2021   Dementia without behavioral disturbance ([place]) 12/05/2020   GERD (gastroesophageal reflux disease) 12/05/2020   Chronic kidney disease (CKD), stage IV (severe) ([place]) 07/30/2020   PCP:  Belva Agee, NP Pharmacy:  No Pharmacies Listed    Social Determinants of Health (SDOH) Interventions    Readmission Risk Interventions     No data to display

## 2021-11-08 ENCOUNTER — Other Ambulatory Visit (HOSPITAL_COMMUNITY): Payer: Self-pay

## 2021-11-08 DIAGNOSIS — Z515 Encounter for palliative care: Secondary | ICD-10-CM | POA: Diagnosis not present

## 2021-11-08 DIAGNOSIS — F0390 Unspecified dementia without behavioral disturbance: Secondary | ICD-10-CM | POA: Diagnosis not present

## 2021-11-08 DIAGNOSIS — N184 Chronic kidney disease, stage 4 (severe): Secondary | ICD-10-CM | POA: Diagnosis not present

## 2021-11-08 DIAGNOSIS — I4821 Permanent atrial fibrillation: Secondary | ICD-10-CM | POA: Diagnosis not present

## 2021-11-08 LAB — HCV INTERPRETATION

## 2021-11-08 MED ORDER — LORAZEPAM 0.5 MG PO TABS
0.2500 mg | ORAL_TABLET | Freq: Two times a day (BID) | ORAL | 0 refills | Status: AC
  Filled 2021-11-08: qty 30, 30d supply, fill #0

## 2021-11-08 MED ORDER — FUROSEMIDE 40 MG PO TABS: 40.0000 mg | ORAL_TABLET | Freq: Every day | ORAL | Status: DC

## 2021-11-08 MED ORDER — FUROSEMIDE 40 MG PO TABS
40.0000 mg | ORAL_TABLET | Freq: Every day | ORAL | 0 refills | Status: AC
Start: 2021-11-09 — End: ?
  Filled 2021-11-08: qty 30, 30d supply, fill #0

## 2021-11-08 NOTE — Progress Notes (Signed)
   11/08/21 1127  Mobility  Activity Dangled on edge of bed  Level of Assistance Minimal assist, patient does 75% or more  Assistive Device None;Other (Comment) (HHA)  Distance Ambulated (ft) 0 ft  Activity Response Tolerated well  $Mobility charge 1 Mobility   Mobility Specialist Progress Note  Pt was in bed and agreeable. Had c/o dizziness but recovered. Returned back to bed w/ all needs met and bed alarm on.   Tanya Koch Mobility Specialist

## 2021-11-08 NOTE — Progress Notes (Addendum)
Palliative Medicine Inpatient Follow Up Note HPI: Tanya Koch is a 88 y.o. female presenting with SOB and elevated HR. Differential for this patient's presentation of this includes systolic CHF exacerbation with Afib (most likely based on CXR findings), aspiration PNA (due to dysphagia history, although unlikely with afebrile and no white count), PE (given left hip replacement in July but unlikely given [place] 1.5), COPD exacerbation (given heavy smoking history with no true COPD dx).   Palliative care has been asked to get involved in the setting of worsening heart failure to discuss goals of care and hospice. Patient is followed by the OP Palliative care team through [place] though has not been see since 02/23/2021.  Today's Discussion 11/08/2021  *Please note that this is a verbal dictation therefore any spelling or grammatical errors are due to the "[employer] One" system interpretation.  Chart reviewed inclusive of vital signs, progress notes, laboratory results, and diagnostic images.   I met at bedside with Tanya Koch, she was trying to remove her IV at the time of entry into her room. We reviewed the reasons why her IV is needed. Re-taped to her arm. She denies pain, shortness of breath, or nausea this morning.   Plan for evaluation by primary team and possible transition to oral lasix.  Possible transition back to [facility] today though this will be decided by primary team.   No family at bedside. I called patients daughter in law this morning, Tanya Koch. She and I discussed that in the past patients medications have taken quite sometime to be delivered to the patient (1 week). We discussed possibly have medications delivered to bedside such as lasix prior to discharge. We reviewed the plan from a symptoms management perspective in terms of SO at this time. We reviewed that hospice will be able to continue support, education, and symptoms management on discharge.  Questions  and concerns addressed/Palliative Support Provided.   Objective Assessment: Vital Signs Vitals:   11/08/21 0540 11/08/21 0754  BP: 131/89   Pulse: 95   Resp: 20   Temp: 97.8 F (36.6 C) 97.6 F (36.4 C)  SpO2: 95%     Intake/Output Summary (Last 24 hours) at 11/08/2021 9702 Last data filed at 11/08/2021 6378 Gross per 24 hour  Intake 1080 ml  Output 2175 ml  Net -1095 ml   Last Weight  Most recent update: 11/08/2021  5:46 AM    Weight  58.5 kg (128 lb 15.5 oz)            Gen:  Elderly Caucasian F in NAD HEENT: moist mucous membranes CV: Regular rate and rhythm PULM: On RA - breathing is even and nonlabored ABD: soft/nontender EXT: No edema Neuro: Alert and oriented to self and place  SUMMARY OF RECOMMENDATIONS   DNAR/DNI   Patients family is interested in the pursuit of hospice care given patients ongoing clinical decline and recurring re-hospitalizations   Plan for transition back to [place] with [facility] hospice  Family requests medications are delivered to bedside prior to discharge   Ongoing Palliative Support  Billing based on MDM: High  Problems Addressed: One acute or chronic illness or injury that poses a threat to life or bodily function  Amount and/or Complexity of Data: Category 3:Discussion of management or test interpretation with external physician/other qualified health care professional/appropriate source (not separately reported)  Risks: Decision not to resuscitate or to de-escalate care because of poor prognosis ______________________________________________________________________________________ [place] Team Team Cell  Phone: [phone] Please utilize secure chat with additional questions, if there is no response within 30 minutes please call the above phone number  Palliative Medicine Team providers are available by phone from 7am to 7pm daily and can be reached through the team cell  phone.  Should this patient require assistance outside of these hours, please call the patient's attending physician.

## 2021-11-08 NOTE — NC FL2 (Signed)
[place] LEVEL OF CARE SCREENING TOOL     IDENTIFICATION  Patient Name: Tanya Koch Birthdate: Nov 08, 1928 Sex: female Admission Date (Current Location): 11/05/2021  [facility] and [place] Number:      Facility and Address:  [facility] [place]. [facility], [place] [address] [place], [place] [address]      Provider Number: [id number]  Attending Physician Name and Address:  Dickie La, MD  Relative Name and Phone Number:       Current Level of Care: Hospital Recommended Level of Care: Memory Care Prior Approval Number:    Date Approved/Denied:   PASRR Number:    Discharge Plan: Other (Comment) (Memory Care)    Current Diagnoses: Patient Active Problem List   Diagnosis Date Noted   Pressure injury of skin 11/06/2021   Elevated transaminase level 11/05/2021   Anxiety 11/04/2021   Acute exacerbation of CHF (congestive heart failure) ([place]) 11/03/2021   Atrial fibrillation ([place]) 11/03/2021   Dysphagia 11/03/2021   Closed left hip fracture, initial encounter ([place]) 09/08/2021   Hypertension 09/08/2021   Emphysema lung ([place]) 09/08/2021   Hyperlipidemia 09/08/2021   Hypothermia 09/08/2021   Normocytic anemia 09/08/2021   Leukocytosis 09/08/2021   Closed right hip fracture, sequela 02/10/2021   Dementia without behavioral disturbance ([place]) 12/05/2020   GERD (gastroesophageal reflux disease) 12/05/2020   Chronic kidney disease (CKD), stage IV (severe) ([place]) 07/30/2020    Orientation RESPIRATION BLADDER Height & Weight     Self  Normal Incontinent Weight: 128 lb 15.5 oz (58.5 kg) Height:  5\' 5"  (165.1 cm)  BEHAVIORAL SYMPTOMS/MOOD NEUROLOGICAL BOWEL NUTRITION STATUS      Incontinent Diet (continue diet)  AMBULATORY STATUS COMMUNICATION OF NEEDS Skin   Extensive Assist Verbally PU Stage and Appropriate Care (Pressure injury- ankle, left, stage 2; pressure injury- perenium, stage 1)                       Personal Care Assistance Level of  Assistance  Bathing, Feeding, Dressing Bathing Assistance: Maximum assistance Feeding assistance: Independent Dressing Assistance: Limited assistance     Functional Limitations Info  Sight, Hearing, Speech Sight Info: Adequate Hearing Info: Impaired Speech Info: Adequate    SPECIAL CARE FACTORS FREQUENCY                       Contractures Contractures Info: Not present    Additional Factors Info  Code Status, Allergies Code Status Info: DNR Allergies Info: no known allergies              Discharge Medications:  STOP taking these medications     ALPRAZolam 0.25 MG tablet Commonly known as: XANAX    cyanocobalamin 1000 MCG tablet Commonly known as: VITAMIN B12    ferrous sulfate 325 (65 FE) MG tablet    folic acid 1 MG tablet Commonly known as: FOLVITE    magnesium oxide 400 (240 Mg) MG tablet Commonly known as: MAG-OX     TAKE these medications     acetaminophen 325 MG tablet Commonly known as: TYLENOL Take 650 mg by mouth every 6 (six) hours as needed for moderate pain.    alum & mag hydroxide-simeth 200-200-20 MG/5ML suspension Commonly known as: MAALOX/MYLANTA Take 30 mLs by mouth every 4 (four) hours as needed for indigestion or heartburn.    aspirin 81 MG chewable tablet Chew 81 mg by mouth daily.    calcium elemental as carbonate 400 MG chewable tablet Commonly  known as: BARIATRIC TUMS ULTRA Chew 1,000 mg by mouth daily.    diphenhydrAMINE 25 MG tablet Commonly known as: BENADRYL Take 0.5 tablets (12.5 mg total) by mouth every 6 (six) hours as needed. What changed: reasons to take this    docusate sodium 100 MG capsule Commonly known as: COLACE Take 100 mg by mouth 2 (two) times daily.    escitalopram 10 MG tablet Commonly known as: LEXAPRO Take 1 tablet (10 mg total) by mouth daily.    feeding supplement Liqd Take 237 mLs by mouth 2 (two) times daily between meals.    furosemide 40 MG tablet Commonly known as: LASIX Take  1 tablet (40 mg total) by mouth daily. Start taking on: November 09, 2021 What changed:  medication strength how much to take when to take this    HYDROcodone-acetaminophen 5-325 MG tablet Commonly known as: Norco Take 1 tablet by mouth every 6 (six) hours as needed. What changed: reasons to take this    LORazepam 0.5 MG tablet Commonly known as: ATIVAN Take 0.5 tablets (0.25 mg total) by mouth 2 (two) times daily.    metoprolol tartrate 25 MG tablet Commonly known as: LOPRESSOR Take 0.5 tablets (12.5 mg total) by mouth 2 (two) times daily.    mouth rinse Liqd solution 15 mLs by Mouth Rinse route as needed (reason not listed on MAR).    ondansetron 4 MG tablet Commonly known as: ZOFRAN Take 1 tablet (4 mg total) by mouth every 8 (eight) hours as needed for nausea or vomiting.    pantoprazole 40 MG tablet Commonly known as: PROTONIX Take 40 mg by mouth daily.    polyethylene glycol 17 g packet Commonly known as: MIRALAX / GLYCOLAX Take 17 g by mouth daily as needed for mild constipation.    PRESCRIPTION MEDICATION Take 3 mg by mouth. Menthol-Cetylpyridium lozenge    rosuvastatin 10 MG tablet Commonly known as: CRESTOR Take 10 mg by mouth at bedtime.    Zinc 30 MG Tabs Take 30 mg by mouth daily.    Relevant Imaging Results:  Relevant Lab Results:   Additional Information SSN: [id number],  Bethann Berkshire, LCSW

## 2021-11-08 NOTE — Progress Notes (Addendum)
Daily Progress Note Intern Pager: [phone]  Patient name: Tanya Koch Medical record number: [id number] Date of birth: [birth year] Age: 88 y.o. Gender: female  Primary Care Provider: Belva Agee, NP Consultants: Cardiology Code Status: DNR/DNI  Pt Overview and Major Events to Date:  Admitted to FMTS 11/05/21   Assessment and Plan:  Tanya Koch is a 88 y.o. female presenting with SOB and elevated HR, most likely due to systolic CHF exacerbation with Afib based on CXR findings.  * Acute exacerbation of CHF (congestive heart failure) (HCC) SOB and increased WOB happen in isolated episodes as a mixture of fluid in lungs and anxiety   - Switch to PO lasix 40 mg   - monitor oxygen need, currently on RA without tachypnea  - Appreciate cardiology recommendations - Monitor I/Os and kidney function  Chronic kidney disease (CKD), stage IV (severe) ([place]) Partially preventing GDMT (ARNI, SGLT2i, and MRA), monitor daily while diuresing   Pressure injury of skin Reported by nursing, Stage 1 sacral ulcer - Turn q2h   Elevated transaminase level Downtrending transaminase levels with diuresis, may benefit from repeat imaging when optimized diuresis. Prothrombin/INR elevated. Monitor symptoms and recheck CMP   Anxiety Continue with Xanax and Lexapro   Dysphagia Monitor swallowing, aspiration precautions   Atrial fibrillation (HCC) Rate controlled today, continue diuresis and metoprolol   Normocytic anemia Stable, repeat CBC in AM   Hypothermia History of hypothermia, external heating as needed   Closed right hip fracture, sequela Stable and healing, PT/OT to see   Dementia without behavioral disturbance ([place]) Pleasantly demented patient without change from baseline as we know her.  - Continue Ativan 0.25 mg BID and Lexapro  - Fall precautions and delirium precautions    FEN/GI: DYS 3 PPx: Hep SQ Dispo: Memory Care unit at [facility] with hospice  today.  Subjective:  Patient was seen at bedside eating breakfast and resting comfortably. She had just had a large BM that soiled all her bedsheets. Nurse was notified.   Objective: Temp:  [97.3 F (36.3 C)-97.8 F (36.6 C)] 97.6 F (36.4 C) (09/12 0754) Pulse Rate:  [80-95] 95 (09/12 0540) Resp:  [18-20] 20 (09/12 0540) BP: (131-139)/(89) 131/89 (09/12 0540) SpO2:  [95 %-100 %] 95 % (09/12 0540) Weight:  [58.5 kg] 58.5 kg (09/12 0540) Physical Exam: General: alert, pleasantly demented, eating breakfast, non-ill or toxic appearing  Cardiovascular: RRR S1 S2 present. No rubs, murmurs, or gallops.  Respiratory: CTAB. No increased WOB and air movement even bilaterally. Chest rise and fall symmetric bilaterally.  Abdomen: Soft, nontender, no hepatosplenomegaly  Extremities: Bruise on right antecubital fossa, Pitting edema minimal in bilateral LE   Laboratory: Most recent CBC Lab Results  Component Value Date   WBC 8.6 11/07/2021   HGB 11.1 (L) 11/07/2021   HCT 34.4 (L) 11/07/2021   MCV 100.0 11/07/2021   PLT 195 11/07/2021   Most recent BMP    Latest Ref Rng & Units 11/07/2021    4:20 AM  BMP  Glucose 70 - 99 mg/dL 95   BUN 8 - 23 mg/dL 65   Creatinine 0.44 - 1.00 mg/dL 3.38   Sodium 135 - 145 mmol/L 140   Potassium 3.5 - 5.1 mmol/L 4.4   Chloride 98 - 111 mmol/L 105   CO2 22 - 32 mmol/L 25   Calcium 8.9 - 10.3 mg/dL 8.5    None new   [name], Medical Student 11/08/2021, 9:01 AM [place]  Intern pager: [phone], text pages welcome Secure chat group [facility] Teaching Service    Resident attestation: I agree with the documentation of Student Dr. [name] above. I have made adjustments to her note as appropriate. I have seen the patient and performed physical exam on the patient consistent with her documented physical exam above.  Erskine Emery, MD

## 2021-11-08 NOTE — H&P (Signed)
  Date: 11/08/2021  Patient name: Tanya Koch  Medical record number: [id number]  Date of birth: 08/18/1928   I have seen and evaluated Tanya Koch and discussed their care with the Residency Team- Dr Johnney Ou.   PMHx, Fam Hx, and/or Soc Hx : dementia, CKD4, HFpEF, PAF  Vitals:   11/08/21 0540 11/08/21 0754  BP: 131/89   Pulse: 95   Resp: 20   Temp: 97.8 F (36.6 C) 97.6 F (36.4 C)  SpO2: 95%    General: resting in bed HEENT: PERRL, EOMI, no scleral icterus Cardiac: irr irr, no rubs, murmurs or gallops JVD to jaw Pulm: decreased at bases, crackles right side Abd: soft, nontender, nondistended, BS present Ext: warm and well perfused, 1+ pedal edema bilaterally Neuro: alert and oriented to self  Assessment and Plan: I have seen and evaluated the patient as outlined above. I agree with the formulated Assessment and Plan as detailed in the residents' note.   Lucious Groves, [place] 9/12/20232:37 PM

## 2021-11-08 NOTE — Progress Notes (Signed)
Report called to [place] at [facility]. All questions and concerns addressed. Patient will be sent with DNR and prescriptions in DC packet.

## 2021-11-08 NOTE — Progress Notes (Addendum)
Rounding Note    Patient Name: Tanya Koch Date of Encounter: 11/08/2021  [place] Cardiologist: Early Osmond, MD   Subjective   No complaints. Sitting up in bed, comfortable.   Inpatient Medications    Scheduled Meds:  aspirin  81 mg Oral Daily   docusate sodium  100 mg Oral BID   escitalopram  10 mg Oral Daily   feeding supplement  237 mL Oral BID BM   ferrous sulfate  325 mg Oral Q breakfast   folic acid  1 mg Oral Daily   furosemide  40 mg Intravenous BID   heparin  5,000 Units Subcutaneous Q8H   LORazepam  0.25 mg Oral BID   magnesium oxide  400 mg Oral BID   metoprolol succinate  50 mg Oral Daily   pantoprazole  40 mg Oral Daily   potassium chloride  40 mEq Oral BID   rosuvastatin  10 mg Oral QHS   Continuous Infusions:  PRN Meds: polyethylene glycol   Vital Signs    Vitals:   11/07/21 0740 11/07/21 1146 11/08/21 0540 11/08/21 0754  BP: 122/78 139/89 131/89   Pulse: 91 80 95   Resp: 19 18 20    Temp: (!) 97.2 F (36.2 C) (!) 97.3 F (36.3 C) 97.8 F (36.6 C) 97.6 F (36.4 C)  TempSrc: Oral Oral Oral Oral  SpO2: 94% 100% 95%   Weight:   58.5 kg   Height:        Intake/Output Summary (Last 24 hours) at 11/08/2021 1008 Last data filed at 11/08/2021 0900 Gross per 24 hour  Intake 954 ml  Output 2175 ml  Net -1221 ml      11/08/2021    5:40 AM 11/07/2021    5:00 AM 11/06/2021   12:34 AM  Last 3 Weights  Weight (lbs) 128 lb 15.5 oz 128 lb 12 oz 137 lb 2 oz  Weight (kg) 58.5 kg 58.4 kg 62.2 kg      Telemetry    Afib rates 80-90 - Personally Reviewed  ECG    No new tracing  Physical Exam   GEN: Pleasant but frail older female, No acute distress.   Neck: No JVD Cardiac: Irreg Irreg, no murmurs, rubs, or gallops.  Respiratory: Clear to auscultation bilaterally. GI: Soft, nontender, non-distended  MS: No edema; No deformity. Neuro:  Nonfocal  Psych: Normal affect   Labs    High Sensitivity Troponin:   Recent Labs   Lab 11/03/21 1000 11/03/21 1424 11/05/21 0448 11/05/21 0700  TROPONINIHS 44* 39* 60* 60*     Chemistry Recent Labs  Lab 11/03/21 1000 11/03/21 1424 11/05/21 0448 11/06/21 0437 11/07/21 0420  NA 140   < > 140 141 140  K 5.6*   < > 4.4 3.5 4.4  CL 108   < > 104 106 105  CO2 21*   < > 20* 23 25  GLUCOSE 119*   < > 120* 105* 95  BUN 44*   < > 64* 67* 65*  CREATININE 2.79*   < > 3.55* 3.47* 3.38*  CALCIUM 8.6*   < > 8.4* 8.3* 8.5*  MG 2.3  --   --  2.2  --   PROT  --   --  6.1* 5.7* 5.8*  ALBUMIN  --   --  2.8* 2.6* 2.6*  AST  --   --  618* 295* 167*  ALT  --   --  485* 365* 285*  ALKPHOS  --   --  71 79 70  BILITOT  --   --  0.9 0.8 0.8  GFRNONAA 16*   < > 12* 12* 12*  ANIONGAP 11   < > 16* 12 10   < > = values in this interval not displayed.    Lipids No results for input(s): "CHOL", "TRIG", "HDL", "LABVLDL", "LDLCALC", "CHOLHDL" in the last 168 hours.  Hematology Recent Labs  Lab 11/05/21 0448 11/06/21 0437 11/07/21 0420  WBC 8.2 8.6 8.6  RBC 3.22* 3.14* 3.44*  HGB 10.5* 10.2* 11.1*  HCT 32.2* 31.0* 34.4*  MCV 100.0 98.7 100.0  MCH 32.6 32.5 32.3  MCHC 32.6 32.9 32.3  RDW 20.1* 20.2* 20.6*  PLT 210 196 195   Thyroid No results for input(s): "TSH", "FREET4" in the last 168 hours.  BNP Recent Labs  Lab 11/03/21 1011 11/05/21 0448  BNP 2,876.4* 9,562.1*    DDimer No results for input(s): "DDIMER" in the last 168 hours.   Radiology    No results found.  Cardiac Studies   Echo: 11/05/21  IMPRESSIONS     1. Left ventricular ejection fraction, by estimation, is 20 to 25%. The  left ventricle has moderately decreased function. The left ventricle  demonstrates global hypokinesis. Left ventricular diastolic parameters are  indeterminate.   2. Right ventricular systolic function is normal. The right ventricular  size is normal. There is moderately elevated pulmonary artery systolic  pressure.   3. Left atrial size was mildly dilated.   4. Right  atrial size was mildly dilated.   5. The mitral valve is abnormal. Mild mitral valve regurgitation. No  evidence of mitral stenosis.   6. The aortic valve was not well visualized. There is mild calcification  of the aortic valve. There is mild thickening of the aortic valve. Aortic  valve regurgitation is not visualized. No aortic stenosis is present.   7. The inferior vena cava is normal in size with <50% respiratory  variability, suggesting right atrial pressure of 8 mmHg.   FINDINGS   Left Ventricle: Left ventricular ejection fraction, by estimation, is 20  to 25%. The left ventricle has moderately decreased function. The left  ventricle demonstrates global hypokinesis. Definity contrast agent was  given IV to delineate the left  ventricular endocardial borders. The left ventricular internal cavity size  was normal in size. There is no left ventricular hypertrophy. Left  ventricular diastolic parameters are indeterminate.   Right Ventricle: The right ventricular size is normal. Right vetricular  wall thickness was not well visualized. Right ventricular systolic  function is normal. There is moderately elevated pulmonary artery systolic  pressure. The tricuspid regurgitant  velocity is 3.36 m/s, and with an assumed right atrial pressure of 8 mmHg,  the estimated right ventricular systolic pressure is 30.8 mmHg.   Left Atrium: Left atrial size was mildly dilated.   Right Atrium: Right atrial size was mildly dilated.   Pericardium: There is no evidence of pericardial effusion.   Mitral Valve: The mitral valve is abnormal. There is mild thickening of  the mitral valve leaflet(s). There is mild calcification of the mitral  valve leaflet(s). Mild mitral annular calcification. Mild mitral valve  regurgitation. No evidence of mitral  valve stenosis.   Tricuspid Valve: The tricuspid valve is not well visualized. Tricuspid  valve regurgitation is mild . No evidence of tricuspid  stenosis.   Aortic Valve: LVOT and AV Dopplers likely underestimated due to angle of  acquisition. The aortic valve was not well visualized. There is  mild  calcification of the aortic valve. There is mild thickening of the aortic  valve. There is mild aortic valve  annular calcification. Aortic valve regurgitation is not visualized. No  aortic stenosis is present. Aortic valve mean gradient measures 1.9 mmHg.  Aortic valve peak gradient measures 4.5 mmHg. Aortic valve area, by VTI  measures 0.63 cm.   Pulmonic Valve: The pulmonic valve was not well visualized. Pulmonic valve  regurgitation is not visualized. No evidence of pulmonic stenosis.   Aorta: The aortic root and ascending aorta are structurally normal, with  no evidence of dilitation.   Venous: The inferior vena cava is normal in size with less than 50%  respiratory variability, suggesting right atrial pressure of 8 mmHg.   IAS/Shunts: No atrial level shunt detected by color flow Doppler.   Patient Profile     88 y.o. female with CAD (Prior MI in [place]) HFrEF (suspect tachy-mediated given prior recover 02/2021), Permanent atrial fib not on anti-coagulation due to dementia, CKD Stage IV, with dementia who was seen for the evaluation of dyspnea and found to have CHF.   Assessment & Plan    HFrEF Suspected NICM -- echo this admission with LVEF of 20-25%, global hypokinesis, normal RV size and function, mild bi-atrial enlargement -- has been diuresing with IV lasix, net - 3.2L, weight down 133>>128lbs -- will transition to lasix 40mg  daily  -- GDMT is limited with her renal disease -- hospice/palliative care following  Permanent Afib -- rate controlled on BB therapy -- no OAC with advanced age and fall risk  CKD stage IV -- baseline around 2.5, up to 3.3 this admission. Hold additional IV lasix transition to oral lasix tomorrow  Per primary Dementia Anxiety  For questions or updates, please contact [place] Please consult [email or web address] for contact info under    Signed, Reino Bellis, NP  11/08/2021, 10:08 AM    Pt discharged prior to my evaluation. Reviewed chart and agree with documentation above.   Sherren Mocha. 11/08/2021 3:19 PM

## 2021-11-08 NOTE — Discharge Summary (Signed)
[facility] Discharge Summary  Patient name: Tanya Koch Medical record number: [id number] Date of birth: 1928-10-18 Age: 88 y.o. Gender: female Date of Admission: 11/05/2021  Date of Discharge: 11/08/21 Admitting Physician: Lucious Groves, DO  Primary Care Provider: Belva Agee, NP Consultants: Cardiology  Indication for Hospitalization: Dyspnea  Brief Hospital Course:  Tanya Koch is a 88 y.o.female with a history of dementia, CKD IV, HFpEF, atrial fibrillation, dementia living in a memory care unit who was admitted to the [place] [facility] for dyspnea. Her hospital course is detailed below:  HFrEF Exacerbation NYHA III, Stage C Bilateral pleural effusions Patient admitted with shortness of breath without hypoxia or oxygen requirement. An echocardiogram showed decreased from previous 20-25%. CXR noted small bilateral pleural effusions. Cardiology was consulted for assistance and patient was diuresed with IV Lasix 40 mg Bid and was able to transition to oral lasix 40mg  daily upon discharge. Patient had diuresis of >3L during the admission.   Goals of Care  Dementia Given concern for patient to continue having admissions due to anxiety and respiratory shortness of breath, consulted with palliative care. Patient was transitioned to hospice care during the admission and will be followed by hospice outpatient. Hospital bed was delivered to memory care unit and further medications for comfort will be determined by hospice care. Unnecessary home medications were discontinued upon discharge.   Chronic kidney disease (CKD), stage IV (severe) (HCC) Patient with elevated creatinine upon discharge that mildly improved after getting Lasix. Upon discharge, patient's creatinine was 3.38 with GFR of 12.  Anxiety Patient's anxiety regarding her breathing status and shortness of breath contributed to patient's readmission. Patient was transitioned from PRN  Xanax to Ativan 0.25mg  BID so that she would not be overly sedated with the lower potency and dose. Patient's home lexapro was continued.    Other chronic conditions were medically managed with home medications and formulary alternatives as necessary    Discharge Diagnoses/Problem List:  Principal Problem for Admission: Acute CHF exacerbation and anxiety Present on Admission:  Elevated transaminase level  Dementia without behavioral disturbance (HCC)  Chronic kidney disease (CKD), stage IV (severe) (HCC)  Hypertension  Atrial fibrillation ([place])  Hypothermia  Normocytic anemia  Anxiety   Disposition: Hospice at [place] unit   Discharge Condition: Stable   Discharge Exam:  Vitals:   11/08/21 0540 11/08/21 0754  BP: 131/89   Pulse: 95   Resp: 20   Temp: 97.8 F (36.6 C) 97.6 F (36.4 C)  SpO2: 95%    General: alert, pleasantly demented, eating breakfast, non-ill or toxic appearing  Cardiovascular: RRR S1 S2 present. No rubs, murmurs, or gallops.  Respiratory: CTAB. No increased WOB and air movement even bilaterally. Chest rise and fall symmetric bilaterally.  Abdomen: Soft, nontender, no hepatosplenomegaly  Extremities: Bruise on right antecubital fossa, Pitting edema minimal in bilateral LE   Issues for Follow Up:  1. Patient is now on hospice. This admission was bounce back admission because she is in a cycle of heart failure causing fluid overload in lungs and SOB, causing her anxiety attacks. Will need to be monitored for comfort of breathing and anxiety.   Significant Procedures: None  Significant Labs and Imaging:  Recent Labs  Lab 11/07/21 0420  WBC 8.6  HGB 11.1*  HCT 34.4*  PLT 195   Recent Labs  Lab 11/07/21 0420  NA 140  K 4.4  CL 105  CO2 25  GLUCOSE 95  BUN 65*  CREATININE 3.38*  CALCIUM 8.5*  ALKPHOS 70  AST 167*  ALT 285*  ALBUMIN 2.6*     Results/Tests Pending at Time of Discharge: None  Discharge Medications:   Allergies as of 11/08/2021   No Known Allergies      Medication List     STOP taking these medications    ALPRAZolam 0.25 MG tablet Commonly known as: XANAX   cyanocobalamin 1000 MCG tablet Commonly known as: VITAMIN B12   ferrous sulfate 325 (65 FE) MG tablet   folic acid 1 MG tablet Commonly known as: FOLVITE   magnesium oxide 400 (240 Mg) MG tablet Commonly known as: MAG-OX       TAKE these medications    acetaminophen 325 MG tablet Commonly known as: TYLENOL Take 650 mg by mouth every 6 (six) hours as needed for moderate pain.   alum & mag hydroxide-simeth 200-200-20 MG/5ML suspension Commonly known as: MAALOX/MYLANTA Take 30 mLs by mouth every 4 (four) hours as needed for indigestion or heartburn.   aspirin 81 MG chewable tablet Chew 81 mg by mouth daily.   calcium elemental as carbonate 400 MG chewable tablet Commonly known as: BARIATRIC TUMS ULTRA Chew 1,000 mg by mouth daily.   diphenhydrAMINE 25 MG tablet Commonly known as: BENADRYL Take 0.5 tablets (12.5 mg total) by mouth every 6 (six) hours as needed. What changed: reasons to take this   docusate sodium 100 MG capsule Commonly known as: COLACE Take 100 mg by mouth 2 (two) times daily.   escitalopram 10 MG tablet Commonly known as: LEXAPRO Take 1 tablet (10 mg total) by mouth daily.   feeding supplement Liqd Take 237 mLs by mouth 2 (two) times daily between meals.   furosemide 40 MG tablet Commonly known as: LASIX Take 1 tablet (40 mg total) by mouth daily. Start taking on: November 09, 2021 What changed:  medication strength how much to take when to take this   HYDROcodone-acetaminophen 5-325 MG tablet Commonly known as: Norco Take 1 tablet by mouth every 6 (six) hours as needed. What changed: reasons to take this   LORazepam 0.5 MG tablet Commonly known as: ATIVAN Take 0.5 tablets (0.25 mg total) by mouth 2 (two) times daily.   metoprolol tartrate 25 MG tablet Commonly  known as: LOPRESSOR Take 0.5 tablets (12.5 mg total) by mouth 2 (two) times daily.   mouth rinse Liqd solution 15 mLs by Mouth Rinse route as needed (reason not listed on MAR).   ondansetron 4 MG tablet Commonly known as: ZOFRAN Take 1 tablet (4 mg total) by mouth every 8 (eight) hours as needed for nausea or vomiting.   pantoprazole 40 MG tablet Commonly known as: PROTONIX Take 40 mg by mouth daily.   polyethylene glycol 17 g packet Commonly known as: MIRALAX / GLYCOLAX Take 17 g by mouth daily as needed for mild constipation.   PRESCRIPTION MEDICATION Take 3 mg by mouth. Menthol-Cetylpyridium lozenge   rosuvastatin 10 MG tablet Commonly known as: CRESTOR Take 10 mg by mouth at bedtime.   Zinc 30 MG Tabs Take 30 mg by mouth daily.        Discharge Instructions: Please refer to Patient Instructions section of EMR for full details.  Patient was counseled important signs and symptoms that should prompt return to medical care, changes in medications, dietary instructions, activity restrictions, and follow up appointments.   Follow-Up Appointments:   [name], Medical Student 11/08/2021, 10:23 AM [place]  FPTS Upper-Level Resident Addendum   I have independently interviewed and examined the patient. I have discussed the above with the original author and agree with their documentation. My edits for correction/addition/clarification are in within the document. Please see also any attending notes.   Rise Patience, DO  PGY-3, [facility] 11/08/2021 10:33 AM  FPTS Service pager: [phone] (text pages welcome through [facility])

## 2021-11-08 NOTE — TOC Transition Note (Signed)
Transition of Care [facility]) - CM/SW Discharge Note   Patient Details  Name: Tanya Koch MRN: [id number] Date of Birth: 1928/03/26  Transition of Care [facility]) CM/SW Contact:  Bethann Berkshire, [place] Phone Number: 11/08/2021, 11:30 AM   Clinical Narrative:     Patient will DC to: [facility] Anticipated DC date: 11/08/21 Family notified: Son Tom Transport by: Corey Harold    Per MD patient ready for DC to [facility]. RN, patient, patient's family, and facility notified of DC. Discharge Summary and FL2 sent to facility. RN to call report prior to discharge ( [phone] and ask for [facility] ). DC packet on chart. Ambulance transport requested for patient.   CSW will sign off for now as social work intervention is no longer needed. Please consult [place] again if new needs arise.   Final next level of care: Memory Care Barriers to Discharge: No Barriers Identified   Patient Goals and CMS Choice        Discharge Placement              Patient chooses bed at:  [facility]) Patient to be transferred to facility by: [facility] Name of family member notified: Son, Gershon Mussel Patient and family notified of of transfer: 11/08/21  Discharge Plan and Services                                     Social Determinants of Health (SDOH) Interventions     Readmission Risk Interventions     No data to display

## 2021-11-09 LAB — HEPATITIS PANEL, ACUTE

## 2021-11-18 ENCOUNTER — Other Ambulatory Visit (HOSPITAL_COMMUNITY): Payer: Self-pay

## 2022-01-12 ENCOUNTER — Ambulatory Visit: Payer: Medicare Other | Admitting: Podiatry

## 2022-01-12 ENCOUNTER — Encounter: Payer: Self-pay | Admitting: Podiatry

## 2022-01-12 DIAGNOSIS — L03031 Cellulitis of right toe: Secondary | ICD-10-CM | POA: Diagnosis not present

## 2022-01-12 NOTE — Progress Notes (Signed)
Subjective:   Patient ID: Tanya Koch, female   DOB: 88 y.o.   MRN: [id number]   HPI Patient presents with caregiver with irritation of the right big toenail that its been sore with redness and unsure of the timing of this.  Does not know whether she may have bumped it as she does have dementia and patient does not smoke and has caregiver to speak for her   Review of Systems  All other systems reviewed and are negative.       Objective:  Physical Exam Vitals and nursing note reviewed.  Constitutional:      Appearance: She is well-developed.  Pulmonary:     Effort: Pulmonary effort is normal.  Musculoskeletal:        General: Normal range of motion.  Skin:    General: Skin is warm.  Neurological:     Mental Status: She is alert.     Neurovascular status found to be intact for her age with the patient noted to have a incurvated right hallux medial border with redness and erythema no drainage noted no proximal edema erythema or drainage noted associated with condition     Assessment:  Paronychia infection of the right hallux localized medial side     Plan:  H&P reviewed condition with her and caregiver and caregiver tried to educate her on this.  We did discuss infection and at this point we are going to treat it conservatively and I did do a anesthetic block of the right big toe I then using sterile instrumentation remove the medial border flushed out the bed applied sterile dressing instructed on soaks and explained if any further changes were to occur to let us know immediately

## 2022-01-12 NOTE — Patient Instructions (Signed)

## 2022-02-02 ENCOUNTER — Ambulatory Visit (INDEPENDENT_AMBULATORY_CARE_PROVIDER_SITE_OTHER): Payer: Medicare Other | Admitting: Podiatry

## 2022-02-02 DIAGNOSIS — L03031 Cellulitis of right toe: Secondary | ICD-10-CM

## 2022-02-02 NOTE — Patient Instructions (Signed)

## 2022-02-03 NOTE — Progress Notes (Signed)
Subjective:   Patient ID: Tanya Koch, female   DOB: 88 y.o.   MRN: [id number]   HPI Patient presents with caregivers after having removal of some nailbed right hallux about 3 weeks ago due to low-grade infection.  She has been on antibiotics and is doing well with no pain but is concerned about the look of the toe   ROS      Objective:  Physical Exam  Neurovascular unchanged with patient having significant vascular disease but at this point is not having systemic findings and no pain in the toe so I do not see anything at her advanced age that they be able to do.  She has some discoloration of the right big toe and it was worse seems to be some better but they are still concerned     Assessment:  Appears to have some crusted tissue right hallux around the nailbed and plantar surface with vascular disease a significant complicating factor but no pain     Plan:  In an absence of pain I do think this is more related to scab tissue versus true gangrene or necrosis.  I did explain to her caregivers that that always is possible that it will advance to that and ultimately she may lose this toe but at this point I gently debrided some tissue and with no pain we will allow this to hopefully heal uneventfully and they are encouraged to come in if any changes were to occur

## 2022-06-03 ENCOUNTER — Emergency Department (HOSPITAL_COMMUNITY)

## 2022-06-03 ENCOUNTER — Emergency Department (HOSPITAL_COMMUNITY)
Admission: EM | Admit: 2022-06-03 | Discharge: 2022-06-03 | Disposition: A | Discharge status: Home / Self Care | Attending: Emergency Medicine | Admitting: Emergency Medicine

## 2022-06-03 ENCOUNTER — Encounter (HOSPITAL_COMMUNITY): Payer: Self-pay

## 2022-06-03 ENCOUNTER — Other Ambulatory Visit: Payer: Self-pay

## 2022-06-03 DIAGNOSIS — N184 Chronic kidney disease, stage 4 (severe): Secondary | ICD-10-CM | POA: Insufficient documentation

## 2022-06-03 DIAGNOSIS — I130 Hypertensive heart and chronic kidney disease with heart failure and stage 1 through stage 4 chronic kidney disease, or unspecified chronic kidney disease: Secondary | ICD-10-CM | POA: Diagnosis not present

## 2022-06-03 DIAGNOSIS — W19XXXA Unspecified fall, initial encounter: Secondary | ICD-10-CM

## 2022-06-03 DIAGNOSIS — I509 Heart failure, unspecified: Secondary | ICD-10-CM | POA: Insufficient documentation

## 2022-06-03 DIAGNOSIS — R292 Abnormal reflex: Secondary | ICD-10-CM | POA: Diagnosis not present

## 2022-06-03 DIAGNOSIS — F0390 Unspecified dementia without behavioral disturbance: Secondary | ICD-10-CM | POA: Diagnosis not present

## 2022-06-03 DIAGNOSIS — G253 Myoclonus: Secondary | ICD-10-CM

## 2022-06-03 DIAGNOSIS — N19 Unspecified kidney failure: Secondary | ICD-10-CM

## 2022-06-03 LAB — URINALYSIS, ROUTINE W REFLEX MICROSCOPIC
Bacteria, UA: NONE SEEN
Bilirubin Urine: NEGATIVE
Glucose, UA: NEGATIVE mg/dL
Hgb urine dipstick: NEGATIVE
Ketones, ur: NEGATIVE mg/dL
Leukocytes,Ua: NEGATIVE
Nitrite: NEGATIVE
Protein, ur: 30 mg/dL — AB
Specific Gravity, Urine: 1.012 (ref 1.005–1.030)
pH: 7 (ref 5.0–8.0)

## 2022-06-03 LAB — CBC WITH DIFFERENTIAL/PLATELET
Abs Immature Granulocytes: 0.02 10*3/uL (ref 0.00–0.07)
Basophils Absolute: 0 10*3/uL (ref 0.0–0.1)
Basophils Relative: 0 %
Eosinophils Absolute: 0.4 10*3/uL (ref 0.0–0.5)
Eosinophils Relative: 4 %
HCT: 36.1 % (ref 36.0–46.0)
Hemoglobin: 11.4 g/dL — ABNORMAL LOW (ref 12.0–15.0)
Immature Granulocytes: 0 %
Lymphocytes Relative: 11 %
Lymphs Abs: 1 10*3/uL (ref 0.7–4.0)
MCH: 32.1 pg (ref 26.0–34.0)
MCHC: 31.6 g/dL (ref 30.0–36.0)
MCV: 101.7 fL — ABNORMAL HIGH (ref 80.0–100.0)
Monocytes Absolute: 1 10*3/uL (ref 0.1–1.0)
Monocytes Relative: 11 %
Neutro Abs: 7.1 10*3/uL (ref 1.7–7.7)
Neutrophils Relative %: 74 %
Platelets: 197 10*3/uL (ref 150–400)
RBC: 3.55 MIL/uL — ABNORMAL LOW (ref 3.87–5.11)
RDW: 14.7 % (ref 11.5–15.5)
WBC: 9.6 10*3/uL (ref 4.0–10.5)
nRBC: 0 % (ref 0.0–0.2)

## 2022-06-03 LAB — COMPREHENSIVE METABOLIC PANEL
ALT: 11 U/L (ref 0–44)
AST: 20 U/L (ref 15–41)
Albumin: 3.4 g/dL — ABNORMAL LOW (ref 3.5–5.0)
Alkaline Phosphatase: 47 U/L (ref 38–126)
Anion gap: 13 (ref 5–15)
BUN: 85 mg/dL — ABNORMAL HIGH (ref 8–23)
CO2: 28 mmol/L (ref 22–32)
Calcium: 9.2 mg/dL (ref 8.9–10.3)
Chloride: 100 mmol/L (ref 98–111)
Creatinine, Ser: 2.89 mg/dL — ABNORMAL HIGH (ref 0.44–1.00)
GFR, Estimated: 15 mL/min — ABNORMAL LOW (ref >60)
Glucose, Bld: 96 mg/dL (ref 70–99)
Potassium: 4.2 mmol/L (ref 3.5–5.1)
Sodium: 141 mmol/L (ref 135–145)
Total Bilirubin: 0.6 mg/dL (ref 0.3–1.2)
Total Protein: 7.3 g/dL (ref 6.5–8.1)

## 2022-06-03 LAB — PHOSPHORUS: Phosphorus: 4.4 mg/dL (ref 2.5–4.6)

## 2022-06-03 LAB — MAGNESIUM: Magnesium: 2.2 mg/dL (ref 1.7–2.4)

## 2022-06-03 MED ORDER — SODIUM CHLORIDE 0.9 % IV BOLUS
250.0000 mL | Freq: Once | INTRAVENOUS | Status: AC
  Administered 2022-06-03: 250 mL via INTRAVENOUS

## 2022-06-03 MED ORDER — SODIUM CHLORIDE 0.9 % IV BOLUS: 500.0000 mL | Freq: Once | INTRAVENOUS | Status: DC

## 2022-06-03 NOTE — ED Provider Notes (Signed)
[facility] [facility] Provider [facility]   CSN: [id number] Arrival date & time: 06/03/22  [id number]     History {Add pertinent medical, surgical, social history, OB history to HPI:1} Chief Complaint  Patient presents with   Lilian KapurFall    Tessah Speciale is a 89 y.o. female.  HPI     89 year old female comes in with chief complaint of mechanical fall.  Later on, son comes to the bedside.  He states that patient also has been having some jerking that they have witnessed.  Patient has history of CKD, CHF, dementia.  She resides at a memory unit.  Patient has no complaints from her side.  She does not recall the fall itself.  I called the nursing home, the nursing home team informed me that patient had a mechanical fall, however they were not aware of any jerking.  Patient has no history of jerking symptoms.  According to the son, they have witnessed 2 episodes of jerking of the right upper extremity.  Patient does not even respond to jerking.  Patient has not had any repeated events.  Nursing staff informed me that they have witnessed jerking of both extremities on 4-5 separate occasions.  Patient has no history of seizures.  Home Medications Prior to Admission medications   Medication Sig Start Date End Date Taking? Authorizing Provider  acetaminophen (TYLENOL) 325 MG tablet Take 650 mg by mouth every 6 (six) hours as needed for moderate pain.    [provider]  alum & mag hydroxide-simeth (MAALOX/MYLANTA) 200-200-20 MG/5ML suspension Take 30 mLs by mouth every 4 (four) hours as needed for indigestion or heartburn.    [provider]  aspirin 81 MG chewable tablet Chew 81 mg by mouth daily.    [provider]  calcium elemental as carbonate (BARIATRIC TUMS ULTRA) 400 MG chewable tablet Chew 1,000 mg by mouth daily.    [provider]  diphenhydrAMINE (BENADRYL) 25 MG tablet Take 0.5 tablets (12.5 mg total) by mouth every 6  (six) hours as needed. Patient taking differently: Take 12.5 mg by mouth every 6 (six) hours as needed (reason not listed on MAR). 09/22/21   Dartha LodgeFord, Kelsey N, PA-C  docusate sodium (COLACE) 100 MG capsule Take 100 mg by mouth 2 (two) times daily.    [provider]  escitalopram (LEXAPRO) 10 MG tablet Take 1 tablet (10 mg total) by mouth daily. 09/18/21   Rhetta MuraSamtani, Jai-Gurmukh, MD  feeding supplement (ENSURE ENLIVE / ENSURE PLUS) LIQD Take 237 mLs by mouth 2 (two) times daily between meals. 09/13/21   Lanae BoastKc, Ramesh, MD  furosemide (LASIX) 40 MG tablet Take 1 tablet (40 mg total) by mouth daily. 11/09/21   Alfredo MartinezMaxwell, Allee, MD  HYDROcodone-acetaminophen (NORCO) 5-325 MG tablet Take 1 tablet by mouth every 6 (six) hours as needed. Patient taking differently: Take 1 tablet by mouth every 6 (six) hours as needed (pain). 09/18/21   Rhetta MuraSamtani, Jai-Gurmukh, MD  LORazepam (ATIVAN) 0.5 MG tablet Take 0.5 tablet (0.25 mg total) by mouth 2 (two) times daily. 11/08/21   Alfredo MartinezMaxwell, Allee, MD  metoprolol tartrate (LOPRESSOR) 25 MG tablet Take 0.5 tablets (12.5 mg total) by mouth 2 (two) times daily. 09/18/21   Rhetta MuraSamtani, Jai-Gurmukh, MD  Mouthwashes (MOUTH RINSE) LIQD solution 15 mLs by Mouth Rinse route as needed (reason not listed on MAR).    [provider]  ondansetron (ZOFRAN) 4 MG tablet Take 1 tablet (4 mg total) by mouth every 8 (eight) hours as  needed for nausea or vomiting. 09/18/21   Rhetta Mura, MD  pantoprazole (PROTONIX) 40 MG tablet Take 40 mg by mouth daily. 07/08/20   [provider]  polyethylene glycol (MIRALAX / GLYCOLAX) 17 g packet Take 17 g by mouth daily as needed for mild constipation. 09/13/21   Lanae Boast, MD  PRESCRIPTION MEDICATION Take 3 mg by mouth. Menthol-Cetylpyridium lozenge    [provider]  rosuvastatin (CRESTOR) 10 MG tablet Take 10 mg by mouth at bedtime. 06/20/20   [provider]  Zinc 30 MG TABS Take 30 mg by mouth daily.    [provider]      Allergies    Patient has no known allergies.    Review of Systems   Review of Systems  All other systems reviewed and are negative.   Physical Exam Updated Vital Signs BP (!) 126/115   Pulse 62   Temp 97.8 F (36.6 C) (Oral)   Resp 20   SpO2 100%  Physical Exam Vitals and nursing note reviewed.  Constitutional:      Appearance: She is well-developed.  HENT:     Head: Normocephalic and atraumatic.  Eyes:     Extraocular Movements: Extraocular movements intact.     Pupils: Pupils are equal, round, and reactive to light.  Neck:     Comments: No midline c-spine tenderness, pt able to turn head to 45 degrees bilaterally without any pain and able to flex neck to the chest and extend without any pain or neurologic symptoms.  Cardiovascular:     Rate and Rhythm: Normal rate.  Pulmonary:     Effort: Pulmonary effort is normal.  Musculoskeletal:     Cervical back: Normal range of motion and neck supple.     Comments: Head to toe evaluation shows no hematoma, bleeding of the scalp, no facial abrasions, no spine step offs, crepitus of the chest or neck, no tenderness to palpation of the bilateral upper and lower extremities, no gross deformities, no chest tenderness, no pelvic pain.   Skin:    General: Skin is dry.  Neurological:     Mental Status: She is alert and oriented to person, place, and time.     ED Results / Procedures / Treatments   Labs (all labs ordered are listed, but only abnormal results are displayed) Labs Reviewed - No data to display  EKG None  Radiology CT Head Wo Contrast  Result Date: 06/03/2022 CLINICAL DATA:  Fall during the night.  Blunt head trauma. EXAM: CT HEAD WITHOUT CONTRAST TECHNIQUE: Contiguous axial images were obtained from the base of the skull through the vertex without intravenous contrast. RADIATION DOSE REDUCTION: This exam was performed according to the departmental dose-optimization program which includes  automated exposure control, adjustment of the mA and/or kV according to patient size and/or use of iterative reconstruction technique. COMPARISON:  09/08/2021 FINDINGS: Brain: No evidence of intracranial hemorrhage, acute infarction, hydrocephalus, extra-axial collection, or mass lesion/mass effect. Mild diffuse cerebral atrophy and chronic small vessel disease show no significant change. Vascular:  No hyperdense vessel or other acute findings. Skull: No evidence of fracture or other significant bone abnormality. Sinuses/Orbits:  No acute findings. Other: None. IMPRESSION: No acute intracranial abnormality. Stable mild cerebral atrophy and chronic small vessel disease. Electronically Signed   By: Danae Orleans M.D.   On: 06/03/2022 09:20    Procedures Procedures  {Document cardiac monitor, telemetry assessment procedure when appropriate:1}  Medications Ordered in ED Medications - No data to  display  ED Course/ Medical Decision Making/ A&P   {   Click here for ABCD2, HEART and other calculatorsREFRESH Note before signing :1}                          Medical Decision Making Amount and/or Complexity of Data Reviewed Labs: ordered. Radiology: ordered.   This patient presents to the ED with chief complaint(s) of mechanical fall and intermittent myoclonic jerk with pertinent past medical history of CKD stage IV, CHF, hypertension and dementia.The complaint involves an extensive differential diagnosis and also carries with it a high risk of complications and morbidity.    The differential diagnosis for mechanical fall includes subdural hematoma, traumatic subarachnoid hemorrhage. C-spine has been cleared clinically.  Patient's musculoskeletal exam is reassuring and I do not think she has fracture of long bones or pneumothorax.  It is unclear what is causing the myoclonic jerking.  In our 10 to 15-minute interaction with the patient we have not witnessed a single episode, but the nursing staff, EMS  and also family has now witnessed the episode.  Differential diagnosis for this would include metabolic derangement, partial seizure, medication side effects, movement disorder.  Patient is on Lexapro, but no other psych medications seen.  She is not altered, we do not think she has serotonin syndrome.  If the labs are reassuring, we will consult with neurology.  The initial plan is to ***   Additional history obtained: Additional history obtained from {additional history:26846} Records reviewed {records:26847}  Independent labs interpretation:  The following labs were independently interpreted: ***  Independent visualization and interpretation of imaging: - I independently visualized the following imaging with scope of interpretation limited to determining acute life threatening conditions related to emergency care: ***, which revealed ***  Treatment and Reassessment: ***  Consultation: - Consulted or discussed management/test interpretation with external professional: ***  Consideration for admission or further workup:  Social Determinants of health:   Final Clinical Impression(s) / ED Diagnoses Final diagnoses:  Fall, initial encounter    Rx / DC Orders ED Discharge Orders     None

## 2022-06-03 NOTE — Discharge Instructions (Addendum)
We saw you in the ER after you had a fall. All the imaging results are normal, no fractures seen. No evidence of brain bleed.   You have chronic kidney disease.  Your BUN today is 85, which is higher than 40-60 level that you have had in the past.  Our neurologist indicated that you can have " jerking episodes" with elevated BUN.  We recommend that you discuss this with your hospice team.  Make sure that you are hydrating well.  The facility where you are staying at can ensure/assist you in making sure that you are appropriately hydrated.   If there is any concerns for new onset seizures, please return to the ER.

## 2022-06-03 NOTE — ED Notes (Signed)
Fall risk bracelet, door magnet, and yellow socks in place.

## 2022-06-03 NOTE — ED Notes (Signed)
Patient transported to CT 

## 2022-06-03 NOTE — ED Notes (Signed)
Pt moved to hallway and placed in large chair for better observation.  Given lunch.

## 2022-06-03 NOTE — ED Triage Notes (Signed)
Pt BIB EMS from [facility]. Per EMS, pt had unwitnessed fall in the middle of night. Unsure of LOC or hitting head. No notable injuries. Pt has had intermittent "jumping/jerking" movements. Pt currently alert.

## 2022-06-03 NOTE — ED Notes (Signed)
[facility] called, 5th in line ?

## 2022-06-03 NOTE — ED Notes (Signed)
Attempted to call report to [facility]. Was transferred to the memory unit to give report to nurse but was hung up on after phone transfer went through.

## 2022-11-28 DEATH — deceased

## 2023-09-09 IMAGING — CT CT HEAD W/O CM
3 series · 15 of 47 positions shown, 18 images · non-contrast
Comparison: 02/10/2021

CLINICAL DATA: Trauma, fall



[Series 2: head wo · axial · 0.45mm/px · z∈[+248,+373]mm · 9 of 31 slices shown, 12 images]
[im 3/31  brain]
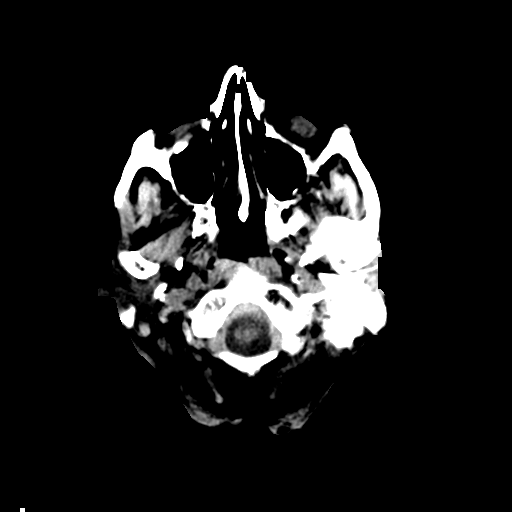
[im 3/31  bone]
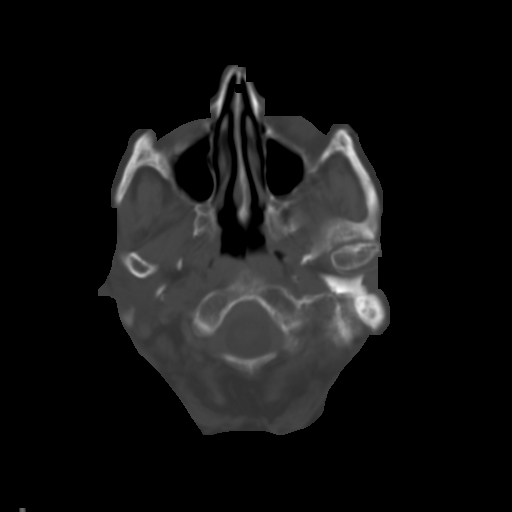
[im 6/31  brain]
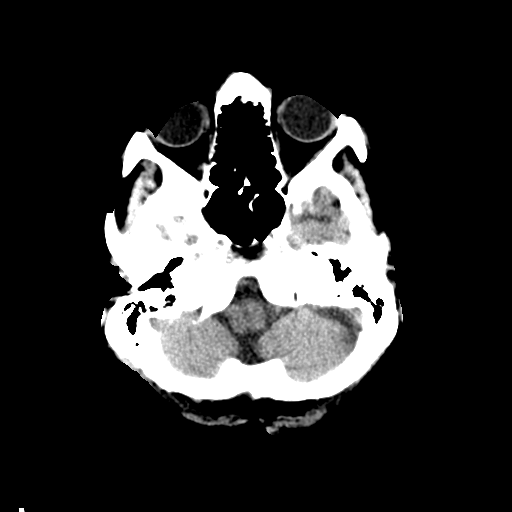
[im 9/31  brain]
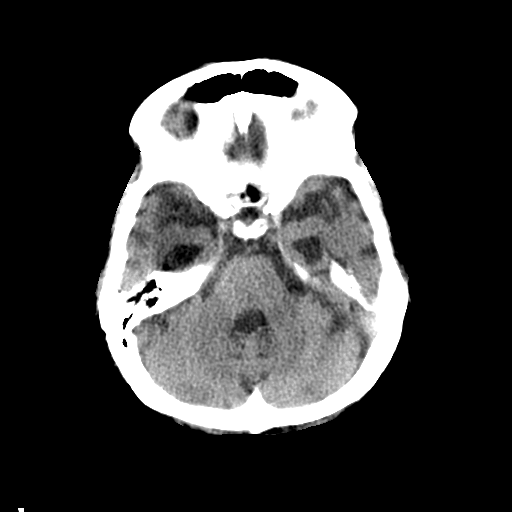
[im 12/31  brain]
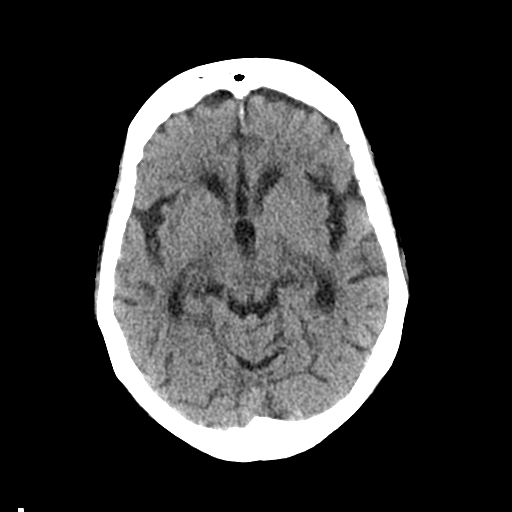
[im 16/31  brain]
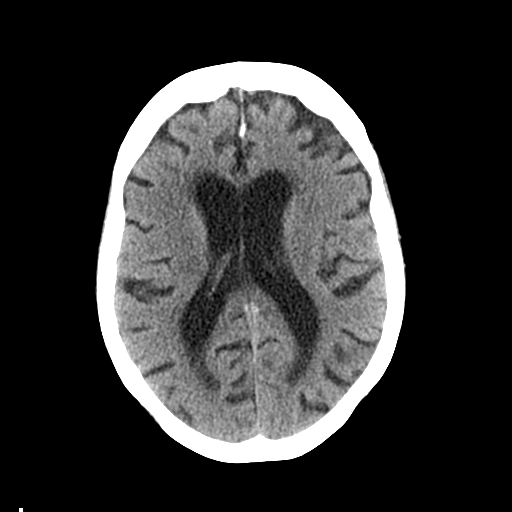
[im 16/31  bone]
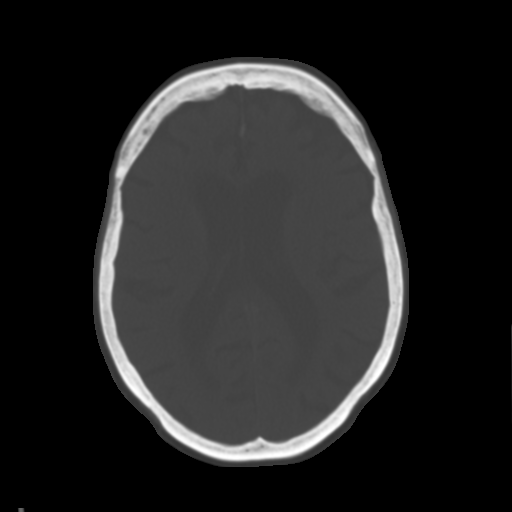
[im 19/31  brain]
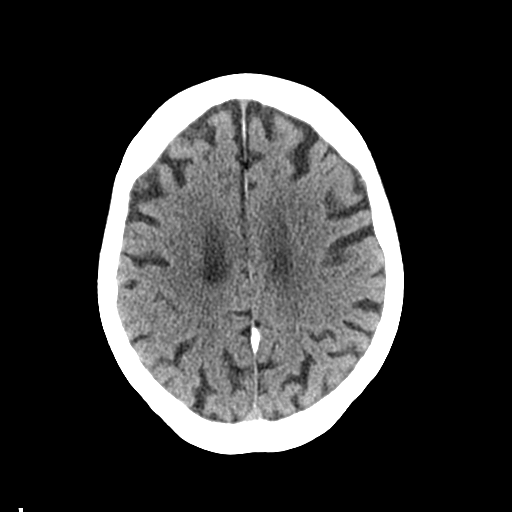
[im 22/31  brain]
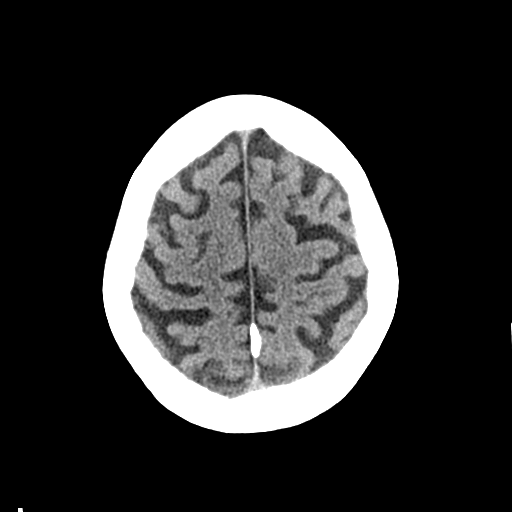
[im 25/31  brain]
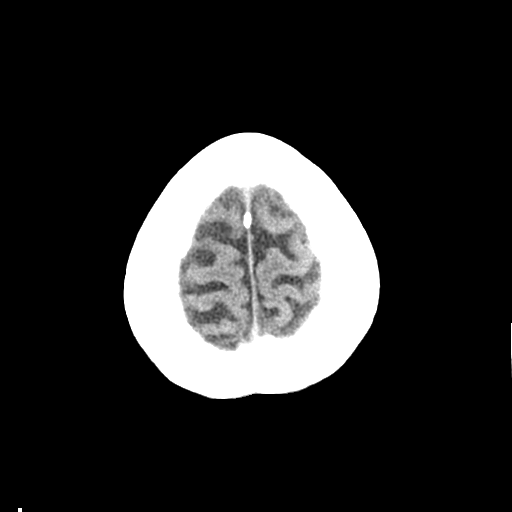
[im 28/31  brain]
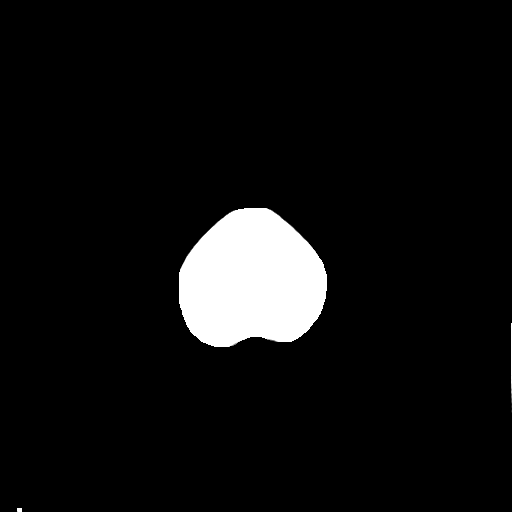
[im 28/31  bone]
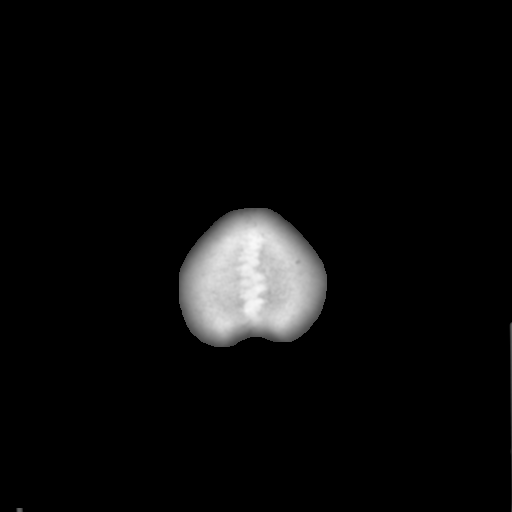

[Series 4: coronal soft tissue · coronal · 0.30mm/px · 3 of 68 slices shown]
[im 23/68  brain]
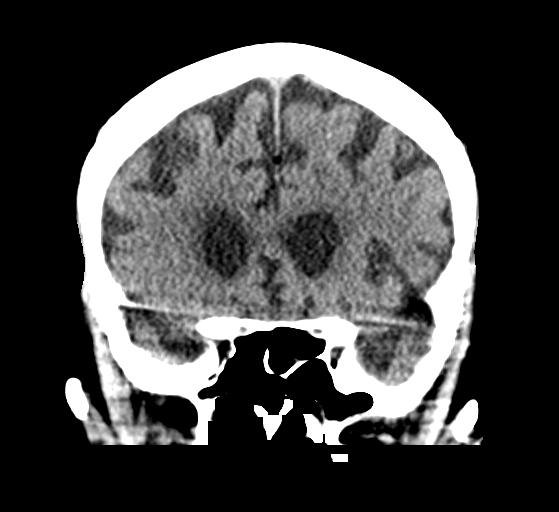
[im 30/68  brain]
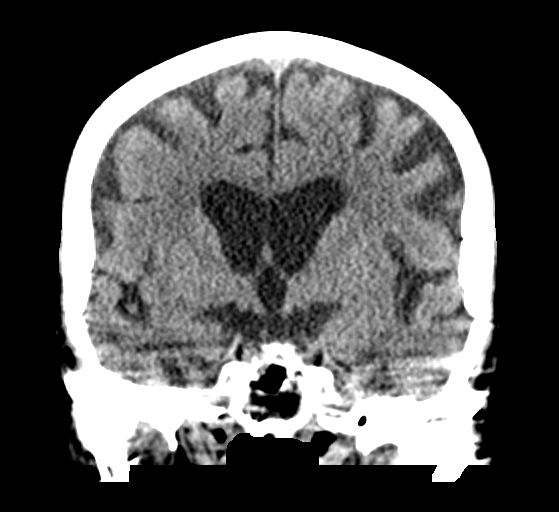
[im 38/68  brain]
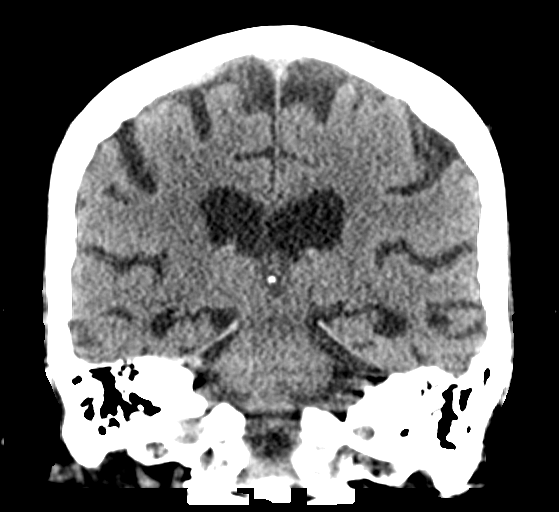

[Series 5: sagittal soft tissue · sagittal · 0.30mm/px · 3 of 56 slices shown]
[im 19/56  brain]
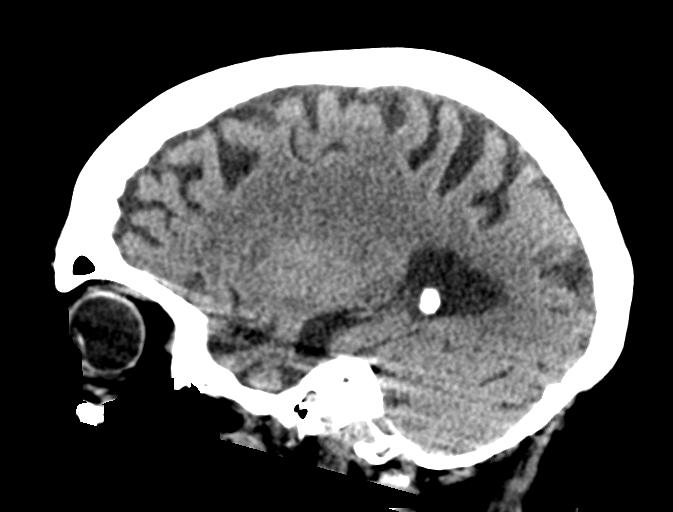
[im 28/56  brain]
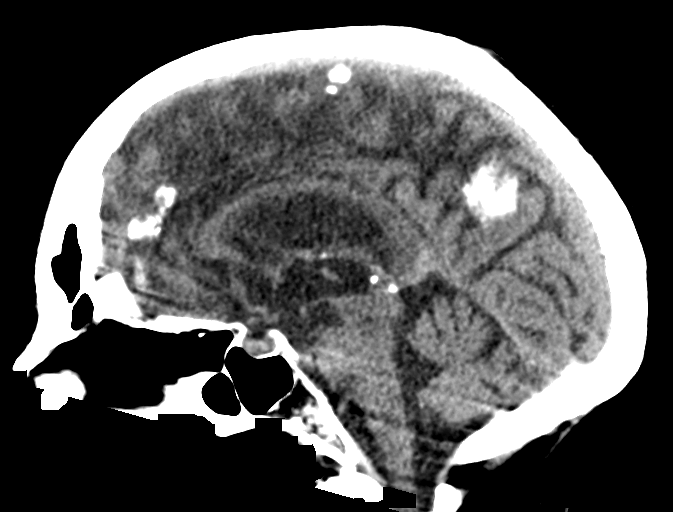
[im 37/56  brain]
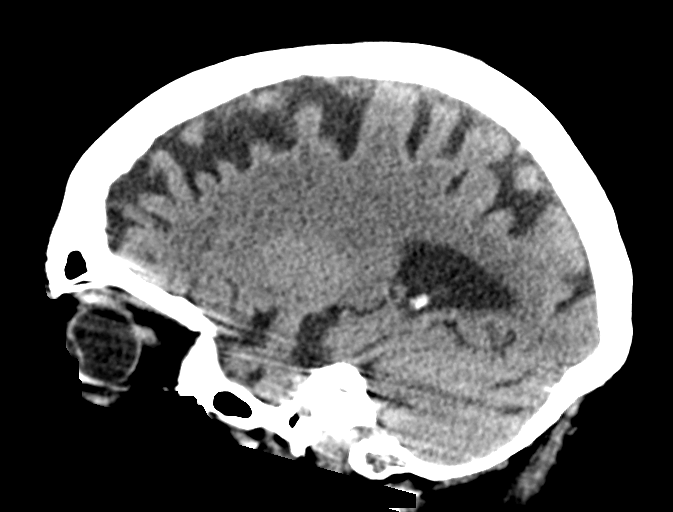

[15 of 47 positions shown; findings below may reference images not displayed]

FINDINGS: There is patient motion in the some of the images limiting the
study.

Brain: No acute intracranial findings are seen in the noncontrast CT
brain. Cortical sulci are prominent. There is no focal mass effect.

Vascular: Unremarkable.

Skull: No fracture is seen.

Sinuses/Orbits: Unremarkable.

Other: None
IMPRESSION: No acute intracranial findings are seen in noncontrast CT brain.
Atrophy.

## 2023-09-09 IMAGING — CR DG FEMUR 2+V*L*
4 series · 4 of 4 positions shown · non-contrast
Comparison: 02/10/2021.

CLINICAL DATA: Un witnessed fall today.  Left lateral hip pain.

EXAM:
LEFT FEMUR 2 VIEWS

[x femur proximal ap left]
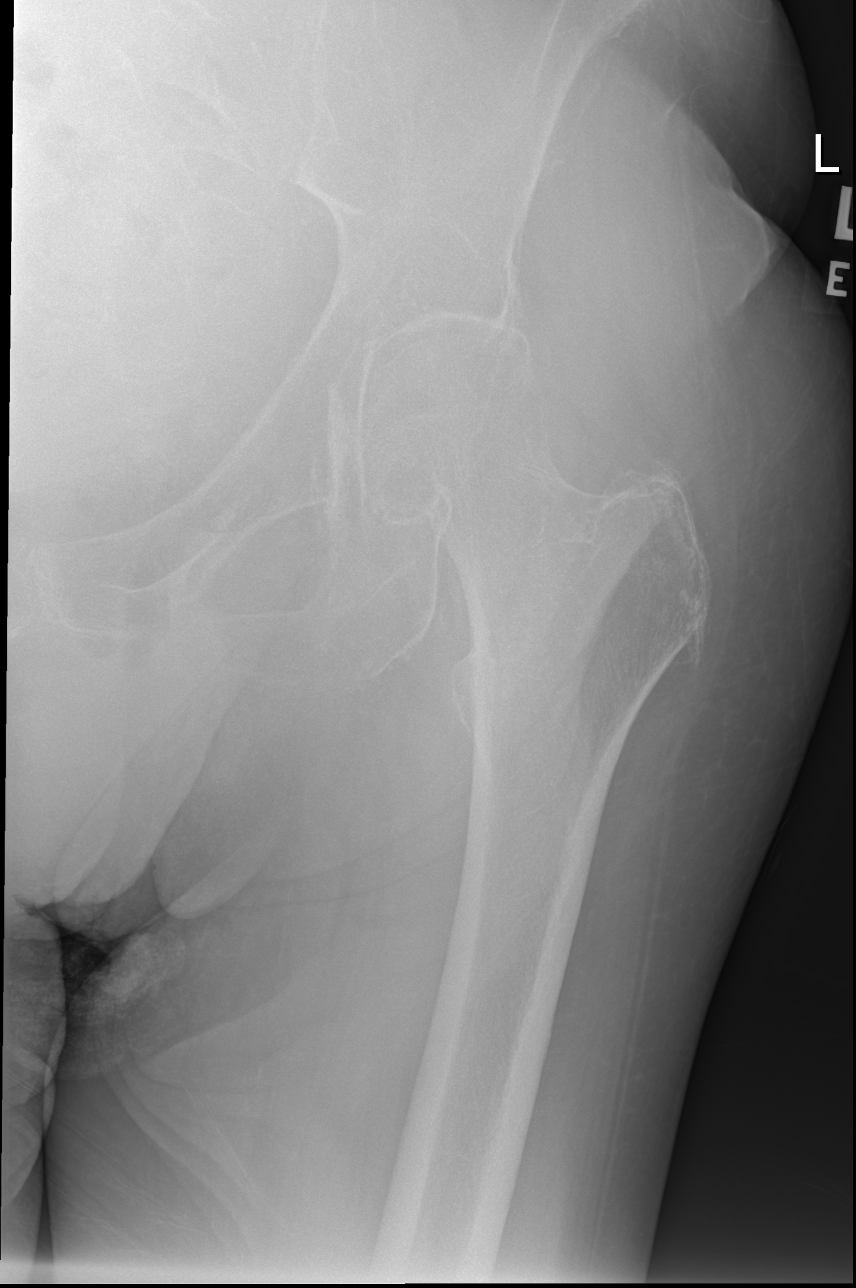

[x femur distal ap left]
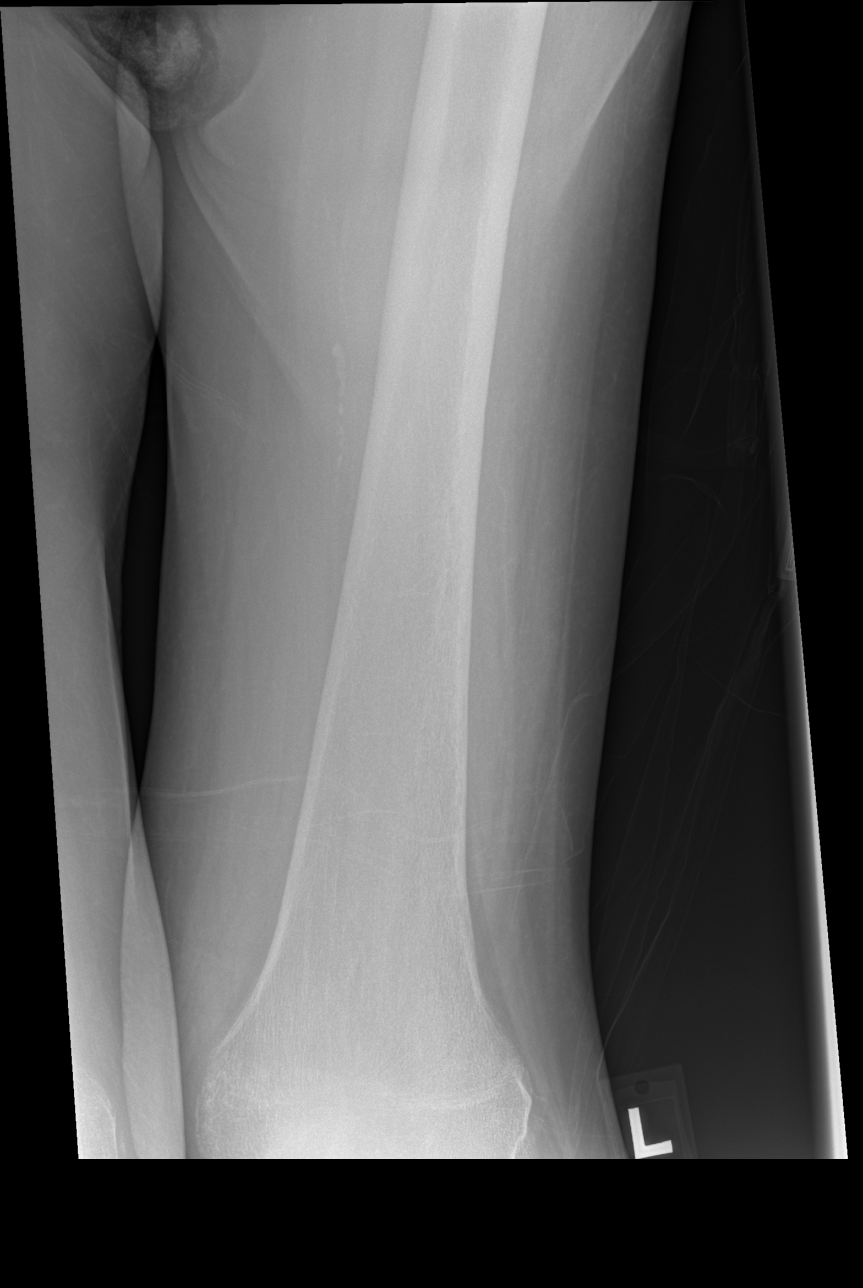

[x hip lat left (1 of 2)]
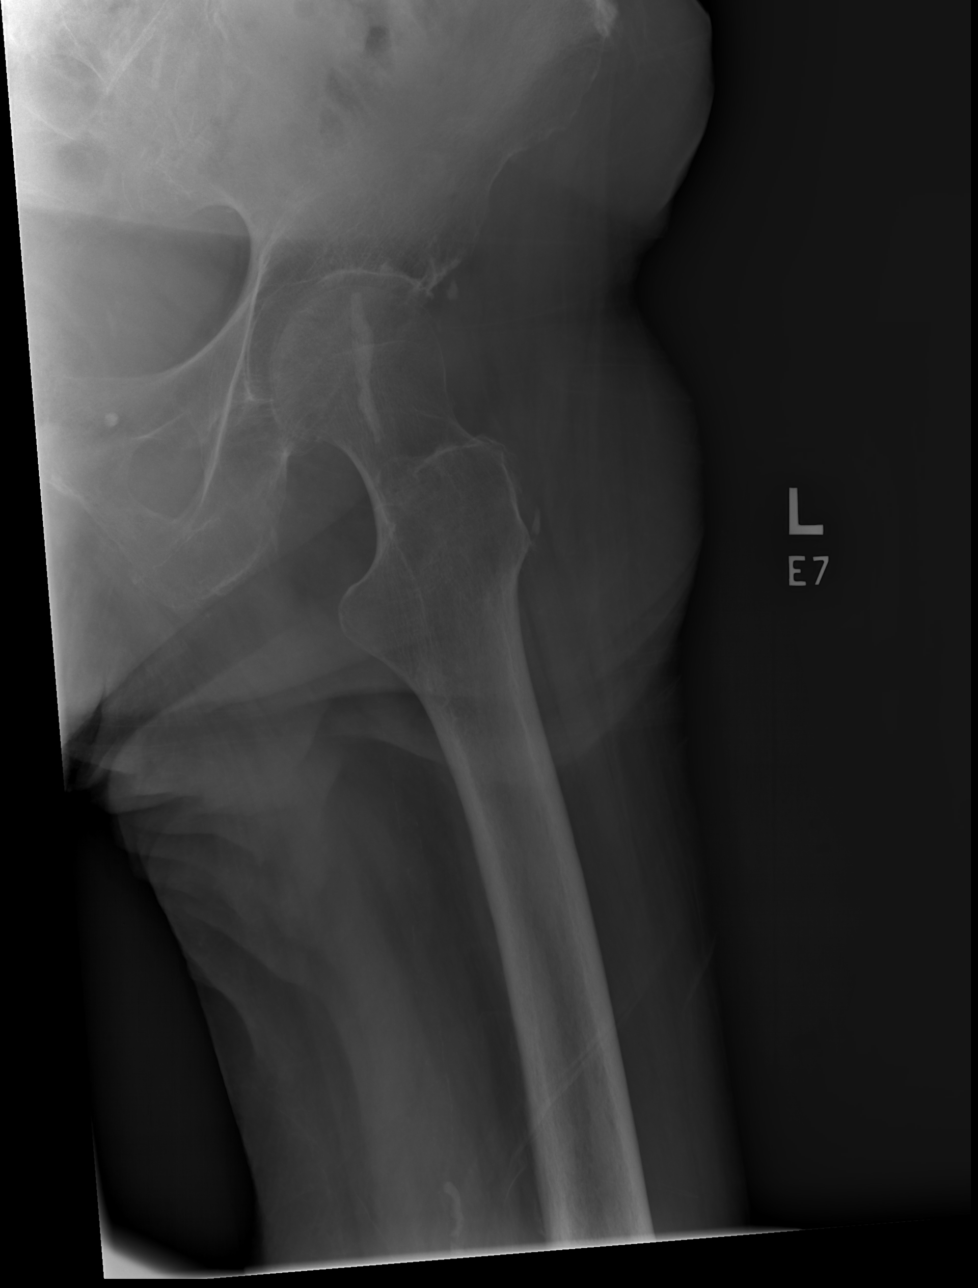

[x hip lat left (2 of 2)]
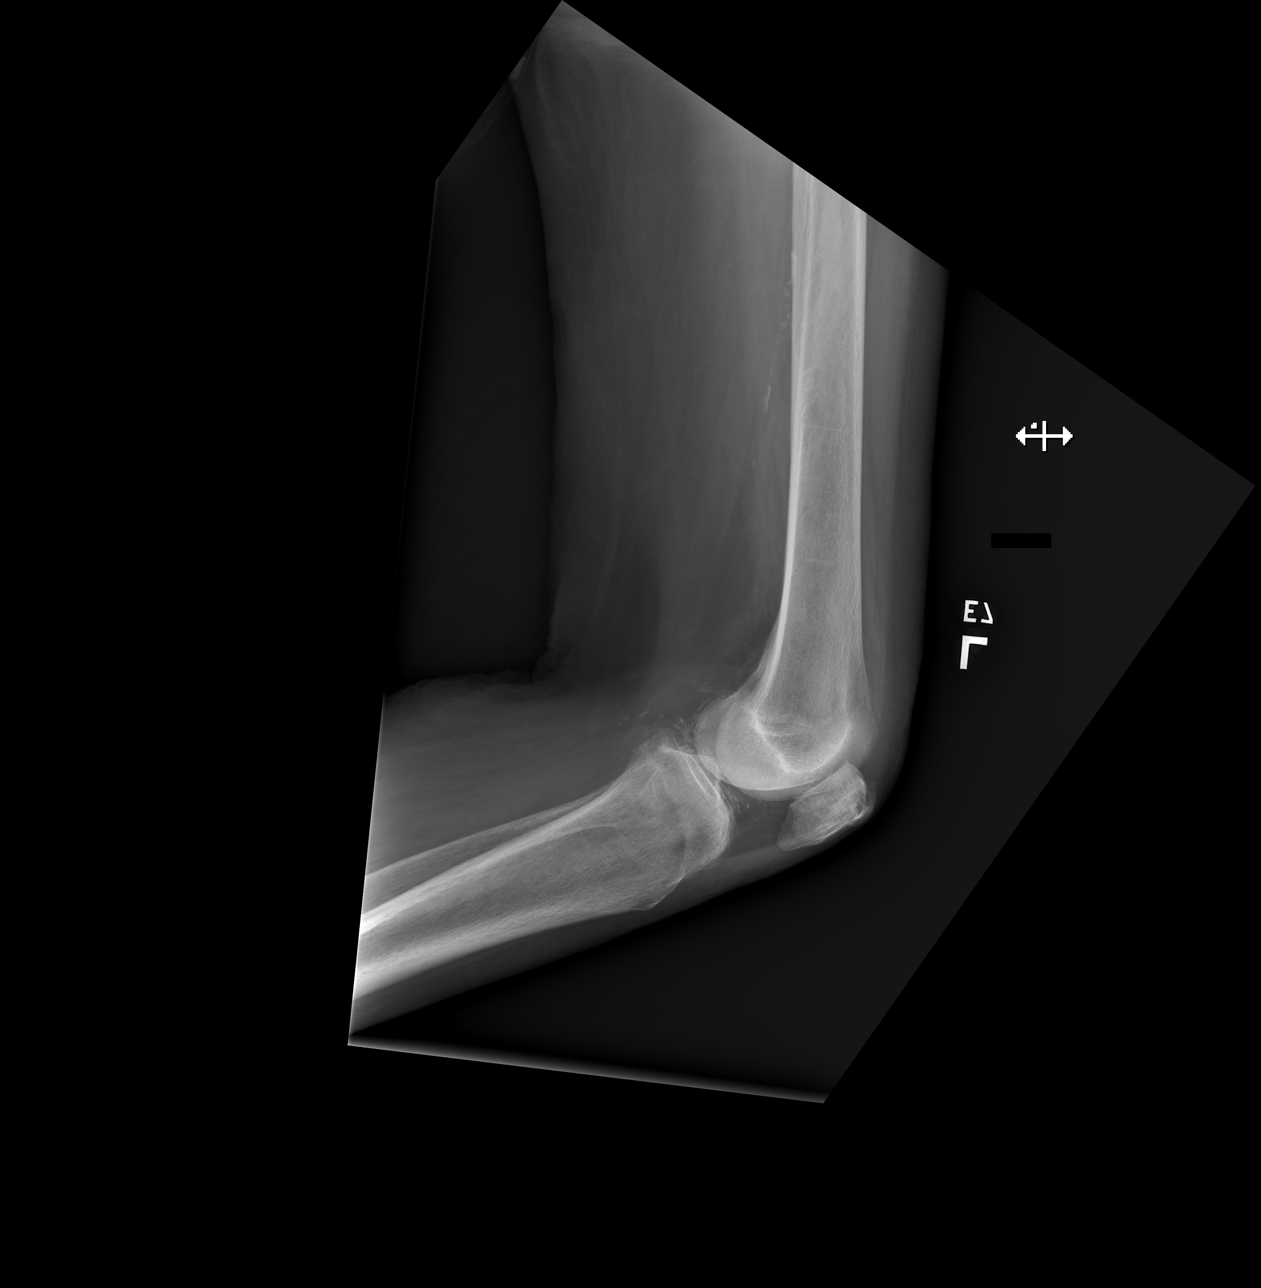

[4 of 4 positions shown; findings below may reference images not displayed]

FINDINGS: No fracture or bone lesion.

Hip and knee joints are normally aligned.

Soft tissues are unremarkable.
IMPRESSION: No fracture or dislocation.
